# Patient Record
Sex: Female | Born: 1935
Health system: Southern US, Community
[De-identification: ages and names within clinical notes are randomized; demographics above are authoritative.]

## PROBLEM LIST (undated history)

## (undated) DIAGNOSIS — N189 Chronic kidney disease, unspecified: Secondary | ICD-10-CM

## (undated) DIAGNOSIS — K219 Gastro-esophageal reflux disease without esophagitis: Secondary | ICD-10-CM

## (undated) DIAGNOSIS — E785 Hyperlipidemia, unspecified: Secondary | ICD-10-CM

## (undated) DIAGNOSIS — I10 Essential (primary) hypertension: Secondary | ICD-10-CM

## (undated) DIAGNOSIS — M810 Age-related osteoporosis without current pathological fracture: Secondary | ICD-10-CM

## (undated) DIAGNOSIS — S7292XA Unspecified fracture of left femur, initial encounter for closed fracture: Secondary | ICD-10-CM

## (undated) DIAGNOSIS — R609 Edema, unspecified: Secondary | ICD-10-CM

## (undated) DIAGNOSIS — R4701 Aphasia: Secondary | ICD-10-CM

## (undated) DIAGNOSIS — J45909 Unspecified asthma, uncomplicated: Secondary | ICD-10-CM

## (undated) HISTORY — PX: KYPHOPLASTY: SHX5884

## (undated) HISTORY — DX: Aphasia: R47.01

---

## 2005-01-18 ENCOUNTER — Ambulatory Visit: Payer: Self-pay | Admitting: Internal Medicine

## 2006-01-20 ENCOUNTER — Ambulatory Visit: Payer: Self-pay | Admitting: Internal Medicine

## 2007-01-25 ENCOUNTER — Ambulatory Visit: Payer: Self-pay | Admitting: Internal Medicine

## 2007-08-31 ENCOUNTER — Other Ambulatory Visit: Payer: Self-pay

## 2007-08-31 ENCOUNTER — Emergency Department: Payer: Self-pay | Admitting: Emergency Medicine

## 2008-08-15 ENCOUNTER — Emergency Department: Payer: Self-pay | Admitting: Emergency Medicine

## 2011-04-08 ENCOUNTER — Ambulatory Visit: Payer: Self-pay

## 2011-05-30 ENCOUNTER — Ambulatory Visit: Payer: Self-pay | Admitting: Unknown Physician Specialty

## 2011-05-30 LAB — URINALYSIS, COMPLETE
Glucose,UR: NEGATIVE mg/dL (ref 0–75)
Nitrite: NEGATIVE
Ph: 5 (ref 4.5–8.0)
Protein: NEGATIVE
RBC,UR: 5 /HPF (ref 0–5)
WBC UR: 38 /HPF (ref 0–5)

## 2011-05-30 LAB — CBC
HCT: 37.8 % (ref 35.0–47.0)
HGB: 12.7 g/dL (ref 12.0–16.0)
MCH: 33.7 pg (ref 26.0–34.0)
Platelet: 237 10*3/uL (ref 150–440)
RBC: 3.75 10*6/uL — ABNORMAL LOW (ref 3.80–5.20)
WBC: 8.7 10*3/uL (ref 3.6–11.0)

## 2011-05-30 LAB — BASIC METABOLIC PANEL
Anion Gap: 11 (ref 7–16)
BUN: 20 mg/dL — ABNORMAL HIGH (ref 7–18)
Chloride: 99 mmol/L (ref 98–107)
Co2: 30 mmol/L (ref 21–32)
Creatinine: 1.53 mg/dL — ABNORMAL HIGH (ref 0.60–1.30)
EGFR (Non-African Amer.): 34 — ABNORMAL LOW
Glucose: 101 mg/dL — ABNORMAL HIGH (ref 65–99)
Osmolality: 282 (ref 275–301)
Potassium: 4.3 mmol/L (ref 3.5–5.1)
Sodium: 140 mmol/L (ref 136–145)

## 2011-05-30 LAB — MRSA PCR SCREENING

## 2011-06-02 ENCOUNTER — Ambulatory Visit: Payer: Self-pay | Admitting: Unknown Physician Specialty

## 2011-11-18 ENCOUNTER — Ambulatory Visit: Payer: Self-pay | Admitting: Orthopedic Surgery

## 2011-11-18 LAB — CREATININE, SERUM
Creatinine: 1.69 mg/dL — ABNORMAL HIGH (ref 0.60–1.30)
EGFR (Non-African Amer.): 26 — ABNORMAL LOW

## 2012-10-26 ENCOUNTER — Ambulatory Visit: Payer: Self-pay | Admitting: Orthopedic Surgery

## 2012-11-27 DIAGNOSIS — Z049 Encounter for examination and observation for unspecified reason: Secondary | ICD-10-CM

## 2012-11-27 DIAGNOSIS — E785 Hyperlipidemia, unspecified: Secondary | ICD-10-CM

## 2012-11-27 DIAGNOSIS — I10 Essential (primary) hypertension: Secondary | ICD-10-CM

## 2012-11-27 DIAGNOSIS — M25559 Pain in unspecified hip: Secondary | ICD-10-CM

## 2012-12-02 ENCOUNTER — Emergency Department: Payer: Self-pay | Admitting: Internal Medicine

## 2012-12-12 DIAGNOSIS — M25559 Pain in unspecified hip: Secondary | ICD-10-CM

## 2012-12-21 ENCOUNTER — Ambulatory Visit: Payer: Self-pay | Admitting: Orthopedic Surgery

## 2012-12-24 ENCOUNTER — Ambulatory Visit: Payer: Self-pay | Admitting: Orthopedic Surgery

## 2013-01-02 ENCOUNTER — Ambulatory Visit: Payer: Self-pay | Admitting: Orthopedic Surgery

## 2013-01-06 LAB — WOUND CULTURE

## 2013-01-11 DIAGNOSIS — M25559 Pain in unspecified hip: Secondary | ICD-10-CM

## 2013-01-11 DIAGNOSIS — I10 Essential (primary) hypertension: Secondary | ICD-10-CM

## 2013-01-11 DIAGNOSIS — E785 Hyperlipidemia, unspecified: Secondary | ICD-10-CM

## 2013-01-30 DIAGNOSIS — K5900 Constipation, unspecified: Secondary | ICD-10-CM

## 2013-01-30 DIAGNOSIS — G8929 Other chronic pain: Secondary | ICD-10-CM

## 2013-01-30 DIAGNOSIS — I10 Essential (primary) hypertension: Secondary | ICD-10-CM

## 2013-02-20 ENCOUNTER — Ambulatory Visit: Payer: Self-pay | Admitting: Family Medicine

## 2014-09-12 NOTE — Op Note (Signed)
PATIENT NAME:  Jacqueline Sanchez, Jacqueline Sanchez MR#:  [id number] DATE OF BIRTH:  01-15-1920  DATE OF PROCEDURE:  01/02/2013  PREOPERATIVE DIAGNOSIS:  L5 compression fracture.  POSTOPERATIVE DIAGNOSIS:  L5 compression fracture.   PROCEDURE:  L5 biopsy and kyphoplasty.   ANESTHESIA:  MAC.   SURGEON:  Laurene Footman, M.D.   DESCRIPTION OF PROCEDURE:  The patient was brought to the Operating Room and after adequate anesthesia was obtained the patient was placed prone.  C-arm was brought in and good visualization of the L5 vertebral body was made.  Appropriate timeout procedure was carried out and patient identification.  The skin was infiltrated with Xylocaine 1%, 5 mL on each side and then the back was prepped and draped in the usual sterile fashion.  A repeat timeout procedure carried out and a spinal needle used to infiltrate a mixture of 0.5% Sensorcaine with epinephrine and 1% Xylocaine along the tract down to the pedicle from the skin.  Stab incisions were made on each side and trocars entered through a perpendicular approach into the vertebral body.  On the right side a biopsy was obtained along with a culture.  Drilling was carried out on both sides and then balloons inflated, approximately 4 mL on each side.  The cement was then infiltrated into these areas with a total of 9 mL infiltrated.  There appeared to be very good fill up to the anterior wall and interdigitation into the vertebral body.  There was no extravasation.  After this good fill had been completed, the trocars were removed and permanent C-arm views obtained.  Gentle pressure applied and then Dermabond was used for the skin, followed by Band-Aids.  The patient was sent to the recovery room in stable condition.   ESTIMATED BLOOD LOSS:  Minimal.   COMPLICATIONS:  None.   SPECIMEN:  L5 vertebral body biopsy.     ____________________________ Laurene Footman, MD mjm:ea D: 01/02/2013 19:01:10 ET T: 01/03/2013 03:28:00  ET JOB#: [id number]  cc: Laurene Footman, MD, <Dictator> Laurene Footman MD ELECTRONICALLY SIGNED 01/03/2013 7:19

## 2014-09-14 NOTE — Discharge Summary (Signed)
PATIENT NAME:  BANNER, IRELAN MR#:  [id number] DATE OF BIRTH:  10-06-1919  DATE OF ADMISSION:  06/02/2011 DATE OF DISCHARGE:  06/03/2011  ADMITTING DIAGNOSIS: Status post left L5-S1 microdiskectomy for disk herniation with persistent radiculopathy and myelopathy.   DISCHARGE DIAGNOSES:  1. Status post left L5-S1 microdiskectomy for disk herniation with persistent radiculopathy and myelopathy.  2. Leg sciatica, improved.   ATTENDING: Alysia Penna. Mauri Pole, MD  with [place]: On 06/02/2011, the patient underwent left L5-S1 microdiskectomy for herniated nucleus pulposus by Dr. Albesa Seen.  Assistant was Dorthula Matas, PA-C. Anesthesia was general. Estimated blood loss was 75 mL. Fluids replaced were Crystalloid 700 mL. Operative findings included a large herniated disk. No specimens were sent. No drains were placed. No complications occurred.   PATIENT HISTORY: Ms. Laumann is a pleasant 81 year old widow with persistent radiating left leg pain. She has not responded to any epidurals or medications with long-term relief. She does feel some weakness. No right leg pain. No fevers, chills or constitutional symptoms.   ALLERGIES AND ADVERSE REACTIONS: Morphine.   PAST MEDICAL HISTORY:  1. Hypertension.  2. Cataracts.  3. Chickenpox.  4. Allergies and hay fever.  5. Hyperlipidemia.   PHYSICAL EXAMINATION: CARDIAC: Normal. CHEST: Atraumatic, clear.  LUMBOSACRAL SPINE: Tender at the lumbosacral junction. Straight leg raise is positive on the left-side at 55 degrees, negative on the right. Decreased sensation in the S1 distribution on the left side. Mild weakness of the gastrocnemius. Heel and toe walk are difficult. Her balance is poor. There is no muscular or skin atrophy.   RADIOLOGICAL DATA: A previous lumbar MRI is reviewed showing a large herniated disk at L5-S1.   HOSPITAL COURSE: The patient underwent the aforementioned procedure by Dr. Albesa Seen on January  [id number] without complication and was transferred to the PACU, then the Orthopedic floor in stable condition. On the first day postoperative, she was tolerating her diet. Her low back dressing was clean and intact. Distally, her neurologic exam was intact. Her calves were nontender. She was treated for deep vein thrombosis prophylaxis with aspirin and TED hose. She did have some hip pain, she stated, but her left leg sciatica had resolved, fortunately. She had been able to get up and go to the bathroom. Physical Therapy did see her and did establish some goals for her.   CONDITION AT DISCHARGE: Stable.   DISPOSITION: [facility].   DISCHARGE MEDICATIONS:  Percocet 5/325 mg, take 1 to 2 every 4 hours as needed for pain.  She will resume home medicines, which include the following:  1. Aspirin 81 mg once a day.  2. Multivitamin once a day in the morning.  3. Atenolol/HCTZ take 1/2 tablet of 50/25 mg daily in the morning.  4. Losartan 50 mg oral, 1 daily in the morning.  5. Gabapentin 300 mg oral b.i.d.  She may be able to go off of her gabapentin as her pain improves. Atorvastatin 80 mg oral tablet once a day at bedtime.  6. Trazodone one at bedtime as needed.  7. Vitamin D3 400 international units daily in the morning. 8. Os-Cal 500 mg oral t.i.d. with meals. 9. Tramadol 50 mg, 1 to 2 every 6 hours as needed for pain.   DISCHARGE INSTRUCTIONS AND FOLLOWUP:  1. Regular diet.  2. She is to wear knee-high TED hose bilaterally every day.  3. She is weight-bearing as tolerated. She is not to lift more than 5 pounds. She will try to avoid  stooping or squatting if at all possible.  4. She is to call [facility] to confirm a follow-up appointment.  5. If any new prescriptions for pain medicines are needed, please call us at [facility].  6. The patient may shower, but she should leave her low back dressing on.  7. She can call our office for any disturbing or troubling  symptoms.  ____________________________ Jerrel Ivory [place], [place] jrp:cbb D: 06/03/2011 13:34:02 ET T: 06/03/2011 13:50:51 ET JOB#: [id number]  cc: Jerrel Ivory. Charlett Nose, [place], <Dictator> [place] PA ELECTRONICALLY SIGNED 06/06/2011 7:45

## 2014-09-14 NOTE — Op Note (Signed)
PATIENT NAME:  Jacqueline Sanchez, Jacqueline Sanchez MR#:  [id number] DATE OF BIRTH:  26-Mar-1920  DATE OF PROCEDURE:  06/02/2011  PREOPERATIVE DIAGNOSIS: Herniated disk L5-S1, left side, with persistent radiculopathy and myelopathy.   POSTOPERATIVE DIAGNOSIS: Herniated disk L5-S1, left side, with persistent radiculopathy and myelopathy.   PROCEDURE: Left L5-S1 microdiskectomy.   SURGEON: Alysia Penna. Mauri Pole, MD    ASSISTANT: Dorthula Matas PA-C    ANESTHESIA: General.   ESTIMATED BLOOD LOSS: 75 mL.  REPLACEMENT: 700 mL Crystalloid.   DRAINS: None.   COMPLICATIONS: None.   BRIEF CLINICAL NOTE AND PATHOLOGY: The patient had persistent radicular pain in her left leg with weakness. Work-up showed evidence of a large extruded disk at L5-S1. Options, risks, and benefits were discussed after she failed conservative treatment. She elected to proceed with surgery. At the time of the procedure, there was marked compression of the S1 nerve root with partial compression of the L5 nerve root and a very large extruded fragment. There was also moderate facet hypertrophy.   DESCRIPTION OF PROCEDURE: Preop antibiotics, adequate general anesthesia, prone position on Wilson frame, all prominences well padded. Routine prepping and draping. Appropriate time-out was called.   Using fluoroscopy and a spinal needle, the appropriate level was marked. Routine posterior incision was made. Subperiosteal dissection was performed and a self-retaining retractor was placed. The microscope was then brought onto the field, position again confirmed, and a small amount of bone was removed with the bur. The ligamentum flavum was then elevated. Herniated disk material was identified. Nerve root was carefully mobilized and disk material was removed. The floor of the canal was thoroughly examined. Hemostasis was obtained with the bipolar cautery. The disk was entered through the posterior defect and several large fragments were removed. The area again  was thoroughly explored. Hemostasis was good. The exposed dura was covered with a thin layer of Surgiflo. The fascia was closed with #2 quill. Sub-Q was closed with 0 quill. Skin was closed with staples. Soft sterile dressing was applied. Sponge and needle counts were reported as correct prior to and after wound closure. The patient was awakened and taken to the PAC-U having tolerated the procedure well.   ____________________________ Alysia Penna. Mauri Pole, MD jcc:drc D: 06/03/2011 06:42:13 ET T: 06/03/2011 10:11:20 ET JOB#: [id number]  cc: Alysia Penna. Mauri Pole, MD, <Dictator> Alysia Penna Vonne Mcdanel MD ELECTRONICALLY SIGNED 06/09/2011 16:06

## 2015-07-10 DIAGNOSIS — E7800 Pure hypercholesterolemia, unspecified: Secondary | ICD-10-CM | POA: Diagnosis not present

## 2015-07-17 DIAGNOSIS — J4520 Mild intermittent asthma, uncomplicated: Secondary | ICD-10-CM | POA: Diagnosis not present

## 2015-07-17 DIAGNOSIS — N183 Chronic kidney disease, stage 3 (moderate): Secondary | ICD-10-CM | POA: Diagnosis not present

## 2015-07-17 DIAGNOSIS — E7800 Pure hypercholesterolemia, unspecified: Secondary | ICD-10-CM | POA: Diagnosis not present

## 2015-07-17 DIAGNOSIS — E538 Deficiency of other specified B group vitamins: Secondary | ICD-10-CM | POA: Diagnosis not present

## 2015-07-17 DIAGNOSIS — I10 Essential (primary) hypertension: Secondary | ICD-10-CM | POA: Diagnosis not present

## 2015-07-24 DIAGNOSIS — M9943 Connective tissue stenosis of neural canal of lumbar region: Secondary | ICD-10-CM | POA: Diagnosis not present

## 2015-10-28 DIAGNOSIS — M9943 Connective tissue stenosis of neural canal of lumbar region: Secondary | ICD-10-CM | POA: Diagnosis not present

## 2016-01-07 DIAGNOSIS — E538 Deficiency of other specified B group vitamins: Secondary | ICD-10-CM | POA: Diagnosis not present

## 2016-01-07 DIAGNOSIS — E7800 Pure hypercholesterolemia, unspecified: Secondary | ICD-10-CM | POA: Diagnosis not present

## 2016-01-14 DIAGNOSIS — J4520 Mild intermittent asthma, uncomplicated: Secondary | ICD-10-CM | POA: Diagnosis not present

## 2016-01-14 DIAGNOSIS — Z0000 Encounter for general adult medical examination without abnormal findings: Secondary | ICD-10-CM | POA: Diagnosis not present

## 2016-01-14 DIAGNOSIS — E7800 Pure hypercholesterolemia, unspecified: Secondary | ICD-10-CM | POA: Diagnosis not present

## 2016-01-14 DIAGNOSIS — N183 Chronic kidney disease, stage 3 (moderate): Secondary | ICD-10-CM | POA: Diagnosis not present

## 2016-01-14 DIAGNOSIS — I10 Essential (primary) hypertension: Secondary | ICD-10-CM | POA: Diagnosis not present

## 2016-01-27 DIAGNOSIS — M9943 Connective tissue stenosis of neural canal of lumbar region: Secondary | ICD-10-CM | POA: Diagnosis not present

## 2016-03-17 DIAGNOSIS — Z23 Encounter for immunization: Secondary | ICD-10-CM | POA: Diagnosis not present

## 2016-03-31 DIAGNOSIS — H35373 Puckering of macula, bilateral: Secondary | ICD-10-CM | POA: Diagnosis not present

## 2016-04-18 DIAGNOSIS — L853 Xerosis cutis: Secondary | ICD-10-CM | POA: Diagnosis not present

## 2016-04-18 DIAGNOSIS — L720 Epidermal cyst: Secondary | ICD-10-CM | POA: Diagnosis not present

## 2016-04-18 DIAGNOSIS — B351 Tinea unguium: Secondary | ICD-10-CM | POA: Diagnosis not present

## 2016-04-18 DIAGNOSIS — L570 Actinic keratosis: Secondary | ICD-10-CM | POA: Diagnosis not present

## 2016-04-18 DIAGNOSIS — L820 Inflamed seborrheic keratosis: Secondary | ICD-10-CM | POA: Diagnosis not present

## 2016-04-18 DIAGNOSIS — B353 Tinea pedis: Secondary | ICD-10-CM | POA: Diagnosis not present

## 2016-04-18 DIAGNOSIS — Z1283 Encounter for screening for malignant neoplasm of skin: Secondary | ICD-10-CM | POA: Diagnosis not present

## 2016-04-18 DIAGNOSIS — L821 Other seborrheic keratosis: Secondary | ICD-10-CM | POA: Diagnosis not present

## 2016-04-20 ENCOUNTER — Emergency Department: Payer: Medicare Other

## 2016-04-20 ENCOUNTER — Inpatient Hospital Stay
Admission: EM | Admit: 2016-04-20 | Discharge: 2016-04-23 | DRG: 305 | Disposition: A | Discharge status: Home / Self Care | Payer: Medicare Other | Attending: Internal Medicine | Admitting: Internal Medicine

## 2016-04-20 DIAGNOSIS — I248 Other forms of acute ischemic heart disease: Secondary | ICD-10-CM | POA: Diagnosis not present

## 2016-04-20 DIAGNOSIS — Z8249 Family history of ischemic heart disease and other diseases of the circulatory system: Secondary | ICD-10-CM

## 2016-04-20 DIAGNOSIS — I161 Hypertensive emergency: Secondary | ICD-10-CM | POA: Diagnosis not present

## 2016-04-20 DIAGNOSIS — R011 Cardiac murmur, unspecified: Secondary | ICD-10-CM | POA: Diagnosis present

## 2016-04-20 DIAGNOSIS — M6281 Muscle weakness (generalized): Secondary | ICD-10-CM

## 2016-04-20 DIAGNOSIS — M810 Age-related osteoporosis without current pathological fracture: Secondary | ICD-10-CM | POA: Diagnosis present

## 2016-04-20 DIAGNOSIS — R748 Abnormal levels of other serum enzymes: Secondary | ICD-10-CM | POA: Diagnosis not present

## 2016-04-20 DIAGNOSIS — R4701 Aphasia: Secondary | ICD-10-CM | POA: Diagnosis present

## 2016-04-20 DIAGNOSIS — N183 Chronic kidney disease, stage 3 unspecified: Secondary | ICD-10-CM | POA: Diagnosis present

## 2016-04-20 DIAGNOSIS — I129 Hypertensive chronic kidney disease with stage 1 through stage 4 chronic kidney disease, or unspecified chronic kidney disease: Secondary | ICD-10-CM | POA: Diagnosis not present

## 2016-04-20 DIAGNOSIS — N179 Acute kidney failure, unspecified: Secondary | ICD-10-CM | POA: Diagnosis present

## 2016-04-20 DIAGNOSIS — I160 Hypertensive urgency: Principal | ICD-10-CM | POA: Diagnosis present

## 2016-04-20 DIAGNOSIS — Z885 Allergy status to narcotic agent status: Secondary | ICD-10-CM

## 2016-04-20 DIAGNOSIS — I10 Essential (primary) hypertension: Secondary | ICD-10-CM | POA: Diagnosis present

## 2016-04-20 DIAGNOSIS — E785 Hyperlipidemia, unspecified: Secondary | ICD-10-CM | POA: Diagnosis not present

## 2016-04-20 DIAGNOSIS — R2990 Unspecified symptoms and signs involving the nervous system: Secondary | ICD-10-CM | POA: Diagnosis not present

## 2016-04-20 DIAGNOSIS — I2489 Other forms of acute ischemic heart disease: Secondary | ICD-10-CM | POA: Diagnosis present

## 2016-04-20 HISTORY — DX: Chronic kidney disease, unspecified: N18.9

## 2016-04-20 HISTORY — DX: Essential (primary) hypertension: I10

## 2016-04-20 HISTORY — DX: Age-related osteoporosis without current pathological fracture: M81.0

## 2016-04-20 HISTORY — DX: Unspecified asthma, uncomplicated: J45.909

## 2016-04-20 HISTORY — DX: Hyperlipidemia, unspecified: E78.5

## 2016-04-20 HISTORY — DX: Edema, unspecified: R60.9

## 2016-04-20 HISTORY — DX: Gastro-esophageal reflux disease without esophagitis: K21.9

## 2016-04-20 LAB — DIFFERENTIAL
BASOS PCT: 1 %
Basophils Absolute: 0.1 10*3/uL (ref 0–0.1)
EOS ABS: 0 10*3/uL (ref 0–0.7)
EOS PCT: 0 %
LYMPHS PCT: 16 %
Lymphs Abs: 1.4 10*3/uL (ref 1.0–3.6)
MONO ABS: 0.7 10*3/uL (ref 0.2–0.9)
Monocytes Relative: 8 %
Neutro Abs: 6.8 10*3/uL — ABNORMAL HIGH (ref 1.4–6.5)
Neutrophils Relative %: 75 %

## 2016-04-20 LAB — PROTIME-INR
INR: 0.85
PROTHROMBIN TIME: 11.6 s (ref 11.4–15.2)

## 2016-04-20 LAB — COMPREHENSIVE METABOLIC PANEL
ALK PHOS: 68 U/L (ref 38–126)
ALT: 9 U/L — ABNORMAL LOW (ref 14–54)
ANION GAP: 9 (ref 5–15)
AST: 18 U/L (ref 15–41)
Albumin: 4.1 g/dL (ref 3.5–5.0)
BUN: 14 mg/dL (ref 6–20)
CALCIUM: 10.2 mg/dL (ref 8.9–10.3)
CO2: 27 mmol/L (ref 22–32)
Chloride: 105 mmol/L (ref 101–111)
Creatinine, Ser: 1.41 mg/dL — ABNORMAL HIGH (ref 0.44–1.00)
GFR calc non Af Amer: 31 mL/min — ABNORMAL LOW (ref >60)
GFR, EST AFRICAN AMERICAN: 36 mL/min — AB (ref >60)
Glucose, Bld: 107 mg/dL — ABNORMAL HIGH (ref 65–99)
Potassium: 3.5 mmol/L (ref 3.5–5.1)
SODIUM: 141 mmol/L (ref 135–145)
Total Bilirubin: 0.6 mg/dL (ref 0.3–1.2)
Total Protein: 7.1 g/dL (ref 6.5–8.1)

## 2016-04-20 LAB — CBC
HCT: 40.1 % (ref 35.0–47.0)
Hemoglobin: 13.4 g/dL (ref 12.0–16.0)
MCH: 33.9 pg (ref 26.0–34.0)
MCHC: 33.3 g/dL (ref 32.0–36.0)
MCV: 101.7 fL — AB (ref 80.0–100.0)
PLATELETS: 267 10*3/uL (ref 150–440)
RBC: 3.95 MIL/uL (ref 3.80–5.20)
RDW: 13.9 % (ref 11.5–14.5)
WBC: 9 10*3/uL (ref 3.6–11.0)

## 2016-04-20 LAB — TROPONIN I
TROPONIN I: 0.07 ng/mL — AB (ref <0.03)
Troponin I: 0.07 ng/mL (ref <0.03)

## 2016-04-20 LAB — GLUCOSE, CAPILLARY: GLUCOSE-CAPILLARY: 110 mg/dL — AB (ref 65–99)

## 2016-04-20 LAB — APTT: aPTT: 27 seconds (ref 24–36)

## 2016-04-20 MED ORDER — HYDRALAZINE HCL 20 MG/ML IJ SOLN
10.0000 mg | INTRAMUSCULAR | Status: DC | PRN
  Administered 2016-04-20 – 2016-04-21 (×5): 10 mg via INTRAVENOUS
  Filled 2016-04-20 (×4): qty 1

## 2016-04-20 MED ORDER — METOPROLOL TARTRATE 5 MG/5ML IV SOLN
10.0000 mg | Freq: Once | INTRAVENOUS | Status: AC
  Administered 2016-04-20: 10 mg via INTRAVENOUS
  Filled 2016-04-20: qty 10

## 2016-04-20 MED ORDER — ADULT MULTIVITAMIN W/MINERALS CH
1.0000 | ORAL_TABLET | Freq: Every day | ORAL | Status: DC
  Administered 2016-04-21 – 2016-04-23 (×3): 1 via ORAL
  Filled 2016-04-20 (×3): qty 1

## 2016-04-20 MED ORDER — VITAMIN B-12 1000 MCG PO TABS
1000.0000 ug | ORAL_TABLET | Freq: Every day | ORAL | Status: DC
  Administered 2016-04-21 – 2016-04-23 (×3): 1000 ug via ORAL
  Filled 2016-04-20 (×3): qty 1

## 2016-04-20 MED ORDER — ACETAMINOPHEN 650 MG RE SUPP: 650.0000 mg | Freq: Four times a day (QID) | RECTAL | Status: DC | PRN

## 2016-04-20 MED ORDER — OXYCODONE-ACETAMINOPHEN 5-325 MG PO TABS
1.0000 | ORAL_TABLET | Freq: Four times a day (QID) | ORAL | Status: DC | PRN
Start: 2016-04-20 — End: 2016-04-21
  Administered 2016-04-21: 1 via ORAL
  Filled 2016-04-20: qty 1

## 2016-04-20 MED ORDER — ACETAMINOPHEN 325 MG PO TABS
650.0000 mg | ORAL_TABLET | Freq: Four times a day (QID) | ORAL | Status: DC | PRN
  Administered 2016-04-22: 650 mg via ORAL
  Filled 2016-04-20: qty 2

## 2016-04-20 MED ORDER — VITAMIN D 1000 UNITS PO TABS
1000.0000 [IU] | ORAL_TABLET | Freq: Every day | ORAL | Status: DC
  Administered 2016-04-21 – 2016-04-23 (×4): 1000 [IU] via ORAL
  Filled 2016-04-20 (×4): qty 1

## 2016-04-20 MED ORDER — ENOXAPARIN SODIUM 40 MG/0.4ML ~~LOC~~ SOLN
30.0000 mg | SUBCUTANEOUS | Status: DC
  Administered 2016-04-21 – 2016-04-22 (×2): 30 mg via SUBCUTANEOUS
  Filled 2016-04-20 (×2): qty 0.4

## 2016-04-20 MED ORDER — LOSARTAN POTASSIUM 50 MG PO TABS
50.0000 mg | ORAL_TABLET | Freq: Every day | ORAL | Status: DC
  Administered 2016-04-21: 50 mg via ORAL
  Filled 2016-04-20: qty 1

## 2016-04-20 MED ORDER — SODIUM CHLORIDE 0.9% FLUSH
3.0000 mL | Freq: Two times a day (BID) | INTRAVENOUS | Status: DC
  Administered 2016-04-21 – 2016-04-22 (×5): 3 mL via INTRAVENOUS

## 2016-04-20 MED ORDER — ONDANSETRON HCL 4 MG/2ML IJ SOLN
4.0000 mg | Freq: Four times a day (QID) | INTRAMUSCULAR | Status: DC | PRN
  Administered 2016-04-21: 4 mg via INTRAVENOUS
  Filled 2016-04-20 (×2): qty 2

## 2016-04-20 MED ORDER — SENNA 8.6 MG PO TABS
1.0000 | ORAL_TABLET | Freq: Every day | ORAL | Status: DC
  Administered 2016-04-21 – 2016-04-23 (×3): 8.6 mg via ORAL
  Filled 2016-04-20 (×3): qty 1

## 2016-04-20 MED ORDER — TORSEMIDE 20 MG PO TABS
10.0000 mg | ORAL_TABLET | Freq: Every day | ORAL | Status: DC
  Administered 2016-04-21 – 2016-04-22 (×2): 10 mg via ORAL
  Filled 2016-04-20 (×2): qty 1

## 2016-04-20 MED ORDER — HYDRALAZINE HCL 20 MG/ML IJ SOLN: 10.0000 mg | INTRAMUSCULAR | Status: DC | PRN

## 2016-04-20 MED ORDER — OXYCODONE HCL 5 MG PO TABS: 5.0000 mg | ORAL_TABLET | Freq: Four times a day (QID) | ORAL | Status: DC | PRN

## 2016-04-20 MED ORDER — HYDRALAZINE HCL 20 MG/ML IJ SOLN
INTRAMUSCULAR | Status: AC
  Administered 2016-04-20: 10 mg via INTRAVENOUS
  Filled 2016-04-20: qty 1

## 2016-04-20 MED ORDER — OXYCODONE-ACETAMINOPHEN 10-325 MG PO TABS: 1.0000 | ORAL_TABLET | Freq: Four times a day (QID) | ORAL | Status: DC | PRN

## 2016-04-20 MED ORDER — ONDANSETRON HCL 4 MG PO TABS
4.0000 mg | ORAL_TABLET | Freq: Four times a day (QID) | ORAL | Status: DC | PRN
Start: 2016-04-20 — End: 2016-04-23

## 2016-04-20 MED ORDER — ATORVASTATIN CALCIUM 20 MG PO TABS
80.0000 mg | ORAL_TABLET | Freq: Every day | ORAL | Status: DC
  Administered 2016-04-21 – 2016-04-23 (×3): 80 mg via ORAL
  Filled 2016-04-20 (×3): qty 4

## 2016-04-20 NOTE — ED Notes (Signed)
Patient transported to MRI with medic

## 2016-04-20 NOTE — ED Notes (Signed)
Float nurse attempting to get blood work at this time

## 2016-04-20 NOTE — ED Provider Notes (Signed)
[facility] Provider Note   CSN: [id number] Arrival date & time: 04/20/16  1511     History   Chief Complaint Chief Complaint  Patient presents with  . Aphasia  . Hypertension    HPI Jacqueline Sanchez is a 82 y.o. female hx of CKD, GERD, HTN, HL, Who presented with trouble speaking. Patient states that she has been having trouble speaking for couple days. Patient unable to tell me how many days she has trouble speaking. Patient actually had lunch with her neighbor yesterday and had some word finding difficulties. They call the primary care office and a visiting nurse came and saw her and noticed that she was hypertensive but she was not sent to the ER at that time. Today, her neighbor visited her again and noticed that her slurred speech has improved. However she was still hypertensive. Patient denies any weakness or numbness. No previous history of stroke and not on blood thinners or aspirin.   The history is provided by the patient.    Past Medical History:  Diagnosis Date  . Asthma   . CKD (chronic kidney disease)   . Edema   . GERD (gastroesophageal reflux disease)   . Hyperlipidemia   . Hypertension   . Osteoporosis     Patient Active Problem List   Diagnosis Date Noted  . Hypertensive urgency 04/20/2016    No past surgical history on file.  OB History    No data available       Home Medications    Prior to Admission medications   Medication Sig Start Date End Date Taking? Authorizing Provider  atorvastatin (LIPITOR) 80 MG tablet Take 1 tablet by mouth daily.   Yes Historical Provider, MD  diphenhydramine-acetaminophen (TYLENOL PM) 25-500 MG TABS tablet Take 1 tablet by mouth at bedtime as needed.   Yes Historical Provider, MD  losartan (COZAAR) 50 MG tablet Take 1 tablet by mouth daily. 03/08/16  Yes Historical Provider, MD  Multiple Vitamin (MULTI-VITAMINS) TABS Take 1 tablet by mouth daily.   Yes Historical Provider, MD    oxyCODONE-acetaminophen (PERCOCET) 10-325 MG tablet Take 1 tablet by mouth every 6 (six) hours as needed. 02/02/16  Yes Historical Provider, MD  senna (SENOKOT) 8.6 MG tablet Take 1 tablet by mouth daily.   Yes Historical Provider, MD  torsemide (DEMADEX) 10 MG tablet Take 1 tablet by mouth daily. 12/11/14  Yes Historical Provider, MD  vitamin B-12 (CYANOCOBALAMIN) 1000 MCG tablet Take 1,000 mcg by mouth daily.   Yes Historical Provider, MD  Vitamin D, Cholecalciferol, 1000 units CAPS Take 1 capsule by mouth daily.   Yes Historical Provider, MD    Family History No family history on file.  Social History Social History  Substance Use Topics  . Smoking status: Not on file  . Smokeless tobacco: Never Used  . Alcohol use No     Allergies   Morphine and related   Review of Systems Review of Systems  Neurological: Positive for speech difficulty.  All other systems reviewed and are negative.    Physical Exam Updated Vital Signs BP (!) 182/122 (BP Location: Left Arm)   Pulse (!) 106   Temp 98.6 F (37 C) (Oral)   Resp 14   Wt 137 lb (62.1 kg)   SpO2 97%   Physical Exam  Constitutional: She is oriented to person, place, and time. She appears well-developed and well-nourished.  Well appearing for age  HENT:  Head: Normocephalic.  Mouth/Throat: Oropharynx is clear and moist.  Eyes: EOM are normal. Pupils are equal, round, and reactive to light.  Neck: Normal range of motion. Neck supple.  Cardiovascular: Normal rate, regular rhythm and normal heart sounds.   Pulmonary/Chest: Effort normal and breath sounds normal. No respiratory distress. She has no wheezes. She has no rales.  Abdominal: Soft. Bowel sounds are normal. She exhibits no distension. There is no tenderness. There is no guarding.  Musculoskeletal: Normal range of motion.  Neurological: She is alert and oriented to person, place, and time.  No obvious facial droop. Occasional word finding difficulty. Nl strength  throughout. Nl finger to nose (has R rotator cuff problem so unable to lift R shoulder up to do finger to nose).   Skin: Skin is warm.  Psychiatric: She has a normal mood and affect.  Nursing note and vitals reviewed.    ED Treatments / Results  Labs (all labs ordered are listed, but only abnormal results are displayed) Labs Reviewed  CBC - Abnormal; Notable for the following:       Result Value   MCV 101.7 (*)    All other components within normal limits  DIFFERENTIAL - Abnormal; Notable for the following:    Neutro Abs 6.8 (*)    All other components within normal limits  COMPREHENSIVE METABOLIC PANEL - Abnormal; Notable for the following:    Glucose, Bld 107 (*)    Creatinine, Ser 1.41 (*)    ALT 9 (*)    GFR calc non Af Amer 31 (*)    GFR calc Af Amer 36 (*)    All other components within normal limits  TROPONIN I - Abnormal; Notable for the following:    Troponin I 0.07 (*)    All other components within normal limits  GLUCOSE, CAPILLARY - Abnormal; Notable for the following:    Glucose-Capillary 110 (*)    All other components within normal limits  PROTIME-INR  APTT  CBC  CREATININE, SERUM  CBG MONITORING, ED    EKG  EKG Interpretation None      ED ECG REPORT I, Wandra Arthurs, the attending physician, personally viewed and interpreted this ECG.   Date: 04/20/2016  EKG Time: 15:36 pm  Rate: 109  Rhythm: sinus tachycardia  Axis: normal  Intervals:none  ST&T Change: LVH     Radiology Ct Head Wo Contrast  Result Date: 04/20/2016 CLINICAL DATA:  Forgetting words, hard time speaking for while, noticed by a friend yesterday, none non last time was normal, history hypertension, asthma, hyperlipidemia EXAM: CT HEAD WITHOUT CONTRAST TECHNIQUE: Contiguous axial images were obtained from the base of the skull through the vertex without intravenous contrast. COMPARISON:  None. FINDINGS: Brain: Generalized atrophy. CSF attenuation structure in the RIGHT frontal lobe  3.4 x 4.1 x 3.4 cm with a few marginal calcifications, with a thin membrane separating it from the adjacent frontal horn of the RIGHT lateral ventricle. Tiny low-attenuation focus in the RIGHT basal ganglia could represent a tiny remote lacunar infarct or a prominent perivascular space. No definite intracranial hemorrhage, additional mass lesion, evidence acute infarction, or extra-axial fluid collection. Vascular: Atherosclerotic calcifications at the internal carotid and vertebral arteries at skullbase. Skull: Intact Sinuses/Orbits: Clear Other: N/A IMPRESSION: No definite evidence of acute hemorrhage or infarct. CSF attenuation cystic appearing mass in the RIGHT frontal lobe, 3.4 x 4.1 x 3.4 cm in size, containing a thin wall with a few calcifications. This has a generally benign appearance and does not appear to communicate with the ventricular system. Differential  diagnosis could include neuroglial cyst, sequela of remote infection or hemorrhage, less likely epidermoid tumor, unlikely arachnoid cyst or porencephalic cyst. If patient's clinical condition warrants further assessment, this could be evaluated by MR. Electronically Signed   By: Lavonia Dana M.D.   On: 04/20/2016 16:11   Mr Brain Wo Contrast  Result Date: 04/20/2016 CLINICAL DATA:  Difficulty speaking.  Possible stroke. EXAM: MRI HEAD WITHOUT CONTRAST TECHNIQUE: Multiplanar, multiecho pulse sequences of the brain and surrounding structures were obtained without intravenous contrast. COMPARISON:  04/20/2016 FINDINGS: Brain: There is no evidence of acute infarct, intracranial hemorrhage, midline shift, or extra-axial fluid collection. Mild generalized cerebral atrophy is within normal limits for age. Subcortical and periventricular white matter T2 hyperintensities are nonspecific but compatible with mild chronic small vessel ischemic disease. A chronic lacunar infarct is noted in the right lentiform nucleus. There are 2 adjacent tiny chronic  infarcts in the left cerebellum. As seen on the earlier CT, the there is a cystic lesion in the anterior right frontal lobe which measures 4.9 x 3.5 x 3.4 cm. This follows CSF signal intensity on all pulse sequences. There is a thin rim of intact overlying cortex. There is mild mass effect on the frontal horn of the right lateral ventricle, however the cyst appears to be separated from the ventricle by a thin membrane. A few thin internal septations are noted in the cyst with susceptibility artifact relating to calcification seen on CT. There is at most minimal gliosis along the posterior margin of this cyst. Vascular: Major intracranial vascular flow voids are preserved. Skull and upper cervical spine: Unremarkable bone marrow signal. Sinuses/Orbits: Prior bilateral cataract extraction. Paranasal sinuses and mastoid air cells are clear. Other: None. IMPRESSION: 1. No acute intracranial abnormality. 2. Mild chronic small vessel ischemic disease. 3. 4.9 cm cystic lesion in the right frontal lobe, benign in appearance. This may reflect the sequelae of prior infection, hemorrhage, or other remote insult. Electronically Signed   By: Logan Bores M.D.   On: 04/20/2016 17:55    Procedures Procedures (including critical care time)  CRITICAL CARE Performed by: Wandra Arthurs   Total critical care time: 30 minutes  Critical care time was exclusive of separately billable procedures and treating other patients.  Critical care was necessary to treat or prevent imminent or life-threatening deterioration.  Critical care was time spent personally by me on the following activities: development of treatment plan with patient and/or surrogate as well as nursing, discussions with consultants, evaluation of patient's response to treatment, examination of patient, obtaining history from patient or surrogate, ordering and performing treatments and interventions, ordering and review of laboratory studies, ordering and review  of radiographic studies, pulse oximetry and re-evaluation of patient's condition.   Medications Ordered in ED Medications  metoprolol (LOPRESSOR) injection 10 mg (not administered)  enoxaparin (LOVENOX) injection 30 mg (not administered)  sodium chloride flush (NS) 0.9 % injection 3 mL (not administered)  acetaminophen (TYLENOL) tablet 650 mg (not administered)    Or  acetaminophen (TYLENOL) suppository 650 mg (not administered)  ondansetron (ZOFRAN) tablet 4 mg (not administered)    Or  ondansetron (ZOFRAN) injection 4 mg (not administered)  hydrALAZINE (APRESOLINE) injection 10 mg (not administered)     Initial Impression / Assessment and Plan / ED Course  I have reviewed the triage vital signs and the nursing notes.  Pertinent labs & imaging results that were available during my care of the patient were reviewed by me and considered in my medical  decision making (see chart for details).  Clinical Course    Angelina Venard is a 82 y.o. female here with occasional trouble speaking. Hypertensive 180/122 in the ED. No obvious facial droop and only has occasional word finding difficulty. Unclear time of onset. Will get CT head and likely MRI brain, labs to see if she had TIA vs stroke.   7:06 PM CT head showed possible cyst vs mass. MRi brain showed uncomplicated cyst with no mass effect or acute stroke. Trop 0.07. No chest pain. Concerned for possible hypertensive urgency. Given lopressor to lower BP. Will admit for hypertensive urgency.   Final Clinical Impressions(s) / ED Diagnoses   Final diagnoses:  None    New Prescriptions New Prescriptions   No medications on file     Drenda Freeze, MD 04/20/16 1906

## 2016-04-20 NOTE — ED Notes (Signed)
Sandwich tray given at this time 

## 2016-04-20 NOTE — H&P (Addendum)
[facility] NAME: Jacqueline Sanchez    MR#:  [id number]  DATE OF BIRTH:  [birth year]   DATE OF ADMISSION:  04/20/2016  PRIMARY CARE PHYSICIAN: Kirk Ruths., MD   REQUESTING/REFERRING PHYSICIAN: Darl Householder  CHIEF COMPLAINT:   Chief Complaint  Patient presents with  . Aphasia  . Hypertension    HISTORY OF PRESENT ILLNESS:  Jacqueline Sanchez  is a 82 y.o. female with a known history of Essential hypertension who is presenting with difficulty speaking. One day prior to admission to the hospital she had difficulty with word finding spoke to her regular doctor who suggested she be evaluated. She saw the doctor earlier today to admission and noted to have markedly elevated blood pressure thus sent to Hospital further workup and evaluation. She is otherwise asymptomatic at this time speech back to normal  Emergency department course MRI brain findings consistent with cystic disease, benign  PAST MEDICAL HISTORY:   Past Medical History:  Diagnosis Date  . Asthma   . CKD (chronic kidney disease)   . Edema   . GERD (gastroesophageal reflux disease)   . Hyperlipidemia   . Hypertension   . Osteoporosis     PAST SURGICAL HISTORY:  History reviewed. No pertinent surgical history.  SOCIAL HISTORY:   Social History  Substance Use Topics  . Smoking status: Never Smoker  . Smokeless tobacco: Never Used  . Alcohol use No    FAMILY HISTORY:   Family History  Problem Relation Age of Onset  . Hypertension Other     DRUG ALLERGIES:   Allergies  Allergen Reactions  . Morphine And Related     REVIEW OF SYSTEMS:  REVIEW OF SYSTEMS:  CONSTITUTIONAL: Denies fevers, chills, fatigue, weakness.  EYES: Denies blurred vision, double vision, or eye pain.  EARS, NOSE, THROAT: Denies tinnitus, ear pain, hearing loss.  RESPIRATORY: denies cough, shortness of breath, wheezing  CARDIOVASCULAR: Denies chest pain, palpitations, edema.    GASTROINTESTINAL: Denies nausea, vomiting, diarrhea, abdominal pain.  GENITOURINARY: Denies dysuria, hematuria.  ENDOCRINE: Denies nocturia or thyroid problems. HEMATOLOGIC AND LYMPHATIC: Denies easy bruising or bleeding.  SKIN: Denies rash or lesions.  MUSCULOSKELETAL: Denies pain in neck, back, shoulder, knees, hips, or further arthritic symptoms.  NEUROLOGIC: Denies paralysis, paresthesias.  PSYCHIATRIC: Denies anxiety or depressive symptoms. Otherwise full review of systems performed by me is negative.   MEDICATIONS AT HOME:   Prior to Admission medications   Medication Sig Start Date End Date Taking? Authorizing Provider  atorvastatin (LIPITOR) 80 MG tablet Take 1 tablet by mouth daily.   Yes Historical Provider, MD  diphenhydramine-acetaminophen (TYLENOL PM) 25-500 MG TABS tablet Take 1 tablet by mouth at bedtime as needed.   Yes Historical Provider, MD  losartan (COZAAR) 50 MG tablet Take 1 tablet by mouth daily. 03/08/16  Yes Historical Provider, MD  Multiple Vitamin (MULTI-VITAMINS) TABS Take 1 tablet by mouth daily.   Yes Historical Provider, MD  oxyCODONE-acetaminophen (PERCOCET) 10-325 MG tablet Take 1 tablet by mouth every 6 (six) hours as needed. 02/02/16  Yes Historical Provider, MD  senna (SENOKOT) 8.6 MG tablet Take 1 tablet by mouth daily.   Yes Historical Provider, MD  torsemide (DEMADEX) 10 MG tablet Take 1 tablet by mouth daily. 12/11/14  Yes Historical Provider, MD  vitamin B-12 (CYANOCOBALAMIN) 1000 MCG tablet Take 1,000 mcg by mouth daily.   Yes Historical Provider, MD  Vitamin D, Cholecalciferol, 1000 units CAPS Take 1 capsule by mouth daily.  Yes Historical Provider, MD      VITAL SIGNS:  Blood pressure (!) 182/122, pulse (!) 106, temperature 98.6 F (37 C), temperature source Oral, resp. rate 14, weight 62.1 kg (137 lb), SpO2 97 %.  PHYSICAL EXAMINATION:  VITAL SIGNS: Vitals:   04/20/16 1517 04/20/16 1615  BP: (!) 182/122   Pulse: (!) 106   Resp: 18 14   Temp: 98.6 F (37 C)    GENERAL:82 y.o.female currently in no acute distress.  HEAD: Normocephalic, atraumatic.  EYES: Pupils equal, round, reactive to light. Extraocular muscles intact. No scleral icterus.  MOUTH: Moist mucosal membrane. Dentition intact. No abscess noted.  EAR, NOSE, THROAT: Clear without exudates. No external lesions.  NECK: Supple. No thyromegaly. No nodules. No JVD.  PULMONARY: Clear to ascultation, without wheeze rails or rhonci. No use of accessory muscles, Good respiratory effort. good air entry bilaterally CHEST: Nontender to palpation.  CARDIOVASCULAR: S1 and S2. Regular rate and rhythm. No murmurs, rubs, or gallops. No edema. Pedal pulses 2+ bilaterally.  GASTROINTESTINAL: Soft, nontender, nondistended. No masses. Positive bowel sounds. No hepatosplenomegaly.  MUSCULOSKELETAL: No swelling, clubbing, or edema. Range of motion full in all extremities.  NEUROLOGIC: Cranial nerves II through XII are intact. No gross focal neurological deficits. Sensation intact. Reflexes intact.  SKIN: No ulceration, lesions, rashes, or cyanosis. Skin warm and dry. Turgor intact.  PSYCHIATRIC: Mood, affect within normal limits. The patient is awake, alert and oriented x 3. Insight, judgment intact.    LABORATORY PANEL:   CBC  Recent Labs Lab 04/20/16 1619  WBC 9.0  HGB 13.4  HCT 40.1  PLT 267   ------------------------------------------------------------------------------------------------------------------  Chemistries   Recent Labs Lab 04/20/16 1619  NA 141  K 3.5  CL 105  CO2 27  GLUCOSE 107*  BUN 14  CREATININE 1.41*  CALCIUM 10.2  AST 18  ALT 9*  ALKPHOS 68  BILITOT 0.6   ------------------------------------------------------------------------------------------------------------------  Cardiac Enzymes  Recent Labs Lab 04/20/16 1619  TROPONINI 0.07*    ------------------------------------------------------------------------------------------------------------------  RADIOLOGY:  Ct Head Wo Contrast  Result Date: 04/20/2016 CLINICAL DATA:  Forgetting words, hard time speaking for while, noticed by a friend yesterday, none non last time was normal, history hypertension, asthma, hyperlipidemia EXAM: CT HEAD WITHOUT CONTRAST TECHNIQUE: Contiguous axial images were obtained from the base of the skull through the vertex without intravenous contrast. COMPARISON:  None. FINDINGS: Brain: Generalized atrophy. CSF attenuation structure in the RIGHT frontal lobe 3.4 x 4.1 x 3.4 cm with a few marginal calcifications, with a thin membrane separating it from the adjacent frontal horn of the RIGHT lateral ventricle. Tiny low-attenuation focus in the RIGHT basal ganglia could represent a tiny remote lacunar infarct or a prominent perivascular space. No definite intracranial hemorrhage, additional mass lesion, evidence acute infarction, or extra-axial fluid collection. Vascular: Atherosclerotic calcifications at the internal carotid and vertebral arteries at skullbase. Skull: Intact Sinuses/Orbits: Clear Other: N/A IMPRESSION: No definite evidence of acute hemorrhage or infarct. CSF attenuation cystic appearing mass in the RIGHT frontal lobe, 3.4 x 4.1 x 3.4 cm in size, containing a thin wall with a few calcifications. This has a generally benign appearance and does not appear to communicate with the ventricular system. Differential diagnosis could include neuroglial cyst, sequela of remote infection or hemorrhage, less likely epidermoid tumor, unlikely arachnoid cyst or porencephalic cyst. If patient's clinical condition warrants further assessment, this could be evaluated by MR. Electronically Signed   By: Lavonia Dana M.D.   On: 04/20/2016 16:11  Mr Brain Wo Contrast  Result Date: 04/20/2016 CLINICAL DATA:  Difficulty speaking.  Possible stroke. EXAM: MRI HEAD  WITHOUT CONTRAST TECHNIQUE: Multiplanar, multiecho pulse sequences of the brain and surrounding structures were obtained without intravenous contrast. COMPARISON:  04/20/2016 FINDINGS: Brain: There is no evidence of acute infarct, intracranial hemorrhage, midline shift, or extra-axial fluid collection. Mild generalized cerebral atrophy is within normal limits for age. Subcortical and periventricular white matter T2 hyperintensities are nonspecific but compatible with mild chronic small vessel ischemic disease. A chronic lacunar infarct is noted in the right lentiform nucleus. There are 2 adjacent tiny chronic infarcts in the left cerebellum. As seen on the earlier CT, the there is a cystic lesion in the anterior right frontal lobe which measures 4.9 x 3.5 x 3.4 cm. This follows CSF signal intensity on all pulse sequences. There is a thin rim of intact overlying cortex. There is mild mass effect on the frontal horn of the right lateral ventricle, however the cyst appears to be separated from the ventricle by a thin membrane. A few thin internal septations are noted in the cyst with susceptibility artifact relating to calcification seen on CT. There is at most minimal gliosis along the posterior margin of this cyst. Vascular: Major intracranial vascular flow voids are preserved. Skull and upper cervical spine: Unremarkable bone marrow signal. Sinuses/Orbits: Prior bilateral cataract extraction. Paranasal sinuses and mastoid air cells are clear. Other: None. IMPRESSION: 1. No acute intracranial abnormality. 2. Mild chronic small vessel ischemic disease. 3. 4.9 cm cystic lesion in the right frontal lobe, benign in appearance. This may reflect the sequelae of prior infection, hemorrhage, or other remote insult. Electronically Signed   By: Logan Bores M.D.   On: 04/20/2016 17:55    EKG:   Orders placed or performed during the hospital encounter of 04/20/16  . ED EKG  . ED EKG    IMPRESSION AND PLAN:    82 year old Caucasian female who is remarkably pleasant with a history of hypertension presenting with high blood pressure  1. Hypertensive urgency: Continue with home medications add as needed hydralazine goal blood pressure will be about 180/100 to be further adjusted after 24 hours ago of 160/80  2. Aphasia,: Completely resolved no evidence of stroke likely all related to blood pressure 3. Hyperlipidemia unspecified Lipitor  4. Elevated troponin: tele, trend cardiac enzyme  All the records are reviewed and case discussed with ED provider. Management plans discussed with the patient, family and they are in agreement.  CODE STATUS: Full  TOTAL TIME TAKING CARE OF THIS PATIENT: 33 minutes.    Sakia Schrimpf,  Karenann Cai.D on 04/20/2016 at 7:08 PM  Between 7am to 6pm - Pager - [phone]  After 6pm: House Pager: - [facility]  Office  [phone]  CC: Primary care physician; Kirk Ruths., MD

## 2016-04-20 NOTE — ED Triage Notes (Addendum)
Pt to ER via POV c/o hypertension and "hard time speaking for a while". When asking patient to elaborate, she cannot give time. Friend states that she noticed it yesterday. Denies slurred speech but patient having a difficult recalling words when trying to hold conversation. Pt speech clear at this time. Pt alert and oriented X4, active, cooperative, pt in NAD. RR even and unlabored, color WNL.

## 2016-04-21 DIAGNOSIS — I248 Other forms of acute ischemic heart disease: Secondary | ICD-10-CM | POA: Diagnosis not present

## 2016-04-21 DIAGNOSIS — E785 Hyperlipidemia, unspecified: Secondary | ICD-10-CM | POA: Diagnosis not present

## 2016-04-21 DIAGNOSIS — N183 Chronic kidney disease, stage 3 unspecified: Secondary | ICD-10-CM | POA: Diagnosis present

## 2016-04-21 DIAGNOSIS — R4701 Aphasia: Secondary | ICD-10-CM

## 2016-04-21 DIAGNOSIS — R748 Abnormal levels of other serum enzymes: Secondary | ICD-10-CM | POA: Diagnosis not present

## 2016-04-21 DIAGNOSIS — I160 Hypertensive urgency: Secondary | ICD-10-CM | POA: Diagnosis not present

## 2016-04-21 DIAGNOSIS — I10 Essential (primary) hypertension: Secondary | ICD-10-CM | POA: Diagnosis not present

## 2016-04-21 LAB — TROPONIN I
TROPONIN I: 0.1 ng/mL — AB (ref <0.03)
Troponin I: 0.08 ng/mL (ref <0.03)

## 2016-04-21 MED ORDER — OXYCODONE-ACETAMINOPHEN 5-325 MG PO TABS: 1.0000 | ORAL_TABLET | Freq: Four times a day (QID) | ORAL | Status: DC | PRN

## 2016-04-21 MED ORDER — LOSARTAN POTASSIUM 50 MG PO TABS
100.0000 mg | ORAL_TABLET | Freq: Every day | ORAL | Status: DC
  Administered 2016-04-22 – 2016-04-23 (×2): 100 mg via ORAL
  Filled 2016-04-21 (×2): qty 2

## 2016-04-21 MED ORDER — AMLODIPINE BESYLATE 5 MG PO TABS
5.0000 mg | ORAL_TABLET | Freq: Every day | ORAL | Status: DC
  Administered 2016-04-21: 5 mg via ORAL
  Filled 2016-04-21: qty 1

## 2016-04-21 MED ORDER — AMLODIPINE BESYLATE 10 MG PO TABS
10.0000 mg | ORAL_TABLET | Freq: Every day | ORAL | Status: DC
  Administered 2016-04-22 – 2016-04-23 (×2): 10 mg via ORAL
  Filled 2016-04-21 (×2): qty 1

## 2016-04-21 MED ORDER — OXYCODONE HCL 5 MG PO TABS: 5.0000 mg | ORAL_TABLET | Freq: Four times a day (QID) | ORAL | Status: DC | PRN

## 2016-04-21 MED ORDER — METOPROLOL SUCCINATE ER 25 MG PO TB24
25.0000 mg | ORAL_TABLET | Freq: Every day | ORAL | Status: DC
Start: 2016-04-21 — End: 2016-04-22
  Administered 2016-04-21 – 2016-04-22 (×2): 25 mg via ORAL
  Filled 2016-04-21 (×2): qty 1

## 2016-04-21 MED ORDER — HYDRALAZINE HCL 25 MG PO TABS
25.0000 mg | ORAL_TABLET | Freq: Three times a day (TID) | ORAL | Status: DC
  Administered 2016-04-21 – 2016-04-22 (×2): 25 mg via ORAL
  Filled 2016-04-21 (×2): qty 1

## 2016-04-21 NOTE — Care Management (Signed)
Patient is a resident of [facility]- independent living level of care.  she lives alone.  Has a friend from the community visiting.  Patient is alert and oriented.  She was placed in observation due to aphasia (which cleared in the ED) and hypertension.  Patient says she is taking her blood pressure medications regularly.  She is ambulatory and and able to perform her alds.  She would rather not have home health nurse come to her home but would like her blood pressure to be checked by the independent living nurse.  CM was informed that patient can not be seen in the home daily for blood pressure checks but she can be seen daily in the clinic.  Informed patient and she is agreeable.

## 2016-04-21 NOTE — Progress Notes (Signed)
Arrival Method: via stretcher with ED NT Mental Orientation:?A&O X 4 Telemetry:?[id number], verified by Jaquita Folds, NT Skin:?Intact, Verified by Lowella Petties, RN IV: 22g left forearm Pain: Pt denies pains Tubes: No attachments Safety Measures: Safety Fall Prevention Plan has been given, discussed & signed, non skid socks in place, patient has been orientated to the room, unit & staff.  Family:?Neighbor and patient have been informed of plan of care. Orders have been reviewed &implemented. Will continue to monitor the patient. Call light has been placed within reach.

## 2016-04-21 NOTE — Care Management Obs Status (Signed)
[facility] NOTIFICATION   Patient Details  Name: Jacqueline Sanchez MRN: [id number] Date of Birth: April 08, 1920   Medicare Observation Status Notification Given:  Yes    Katrina Stack, RN 04/21/2016, 3:08 PM

## 2016-04-21 NOTE — Consult Note (Signed)
Cardiology Consultation Note  Patient ID: Jacqueline Sanchez, MRN: [id number], DOB/AGE: [birth year] 80 y.o. Admit date: 04/20/2016   Date of Consult: 04/21/2016 Primary Physician: Kirk Ruths., MD Primary Cardiologist: New to [facility] Requesting Physician: Dr. Manuella Ghazi, MD  Chief Complaint: Aphasia Reason for Consult: Elevated troponin  HPI: 82 y.o. female with h/o CKD stage III, HTN, HLD, asthma, GERD, and osteoporosis who presented to [facility] on 11/29 with difficulty speaking and accelerated hypertension. She was found to have malignant urgency with SBP of 213 mmHg. Troponin peaked at 0.10. Cardiology asked to evaluate.   No prior known cardiac history. Never with chest pain or SOB leading her to come to the hospital. No current symptoms concerning for ACS while admitted. Patient was admitted on 11/29 after noting difficulty with word finding after talking with her PCP who recommended ED evaluation. Upon her arrival to the ED she was noted to be markedly hypertensive with SBP 213. She was continued on her current home antihypertensive medications with prn hydralazine added by IM. BP currently 329 systolic. Troponin was checked and trended with a peak of 0.10 at this time. 12-lead EKG showed sinus tachycardia, 109 bpm, LVH, nonspecific st/t changes. SCr 1.41 (approximately her baseline), K+ 3.5, wbc 9.0, hgb 13.4, plt 267. CT head without definited evidence of acute hemorrhage or infarct. CSF attenuation cystic mass in teh right frontal lobe measuring 3.4 x 4.1 x 3.4 cm. MRI brain showed no acute intracranial abnormality with mild chronic small vessel diseae. 4.9 cm cystic lesion in the right frontal lobe that was benign appearing.    Past Medical History:  Diagnosis Date  . Asthma   . CKD (chronic kidney disease)   . Edema   . GERD (gastroesophageal reflux disease)   . Hyperlipidemia   . Hypertension   . Osteoporosis       Most Recent Cardiac Studies: Echo pending   Surgical History:  History reviewed. No pertinent surgical history.   Home Meds: Prior to Admission medications   Medication Sig Start Date End Date Taking? Authorizing Provider  atorvastatin (LIPITOR) 80 MG tablet Take 1 tablet by mouth daily.   Yes Historical Provider, MD  diphenhydramine-acetaminophen (TYLENOL PM) 25-500 MG TABS tablet Take 1 tablet by mouth at bedtime as needed.   Yes Historical Provider, MD  losartan (COZAAR) 50 MG tablet Take 1 tablet by mouth daily. 03/08/16  Yes Historical Provider, MD  Multiple Vitamin (MULTI-VITAMINS) TABS Take 1 tablet by mouth daily.   Yes Historical Provider, MD  oxyCODONE-acetaminophen (PERCOCET) 10-325 MG tablet Take 1 tablet by mouth every 6 (six) hours as needed. 02/02/16  Yes Historical Provider, MD  senna (SENOKOT) 8.6 MG tablet Take 1 tablet by mouth daily.   Yes Historical Provider, MD  torsemide (DEMADEX) 10 MG tablet Take 1 tablet by mouth daily. 12/11/14  Yes Historical Provider, MD  vitamin B-12 (CYANOCOBALAMIN) 1000 MCG tablet Take 1,000 mcg by mouth daily.   Yes Historical Provider, MD  Vitamin D, Cholecalciferol, 1000 units CAPS Take 1 capsule by mouth daily.   Yes Historical Provider, MD    Inpatient Medications:  . atorvastatin  80 mg Oral Daily  . cholecalciferol  1,000 Units Oral Daily  . enoxaparin (LOVENOX) injection  30 mg Subcutaneous Q24H  . losartan  50 mg Oral Daily  . multivitamin with minerals  1 tablet Oral Daily  . senna  1 tablet Oral Daily  . sodium chloride flush  3 mL Intravenous Q12H  . torsemide  10 mg Oral Daily  .  vitamin B-12  1,000 mcg Oral Daily     Allergies:  Allergies  Allergen Reactions  . Morphine And Related     Social History   Social History  . Marital status: Widowed    Spouse name: N/A  . Number of children: N/A  . Years of education: N/A   Occupational History  . Not on file.   Social History Main Topics  . Smoking status: Never Smoker  . Smokeless tobacco: Never Used  . Alcohol use No  .  Drug use: Unknown  . Sexual activity: Not on file   Other Topics Concern  . Not on file   Social History Narrative  . No narrative on file     Family History  Problem Relation Age of Onset  . Hypertension Other      Review of Systems: Review of Systems  Constitutional: Positive for malaise/fatigue. Negative for chills, diaphoresis, fever and weight loss.  HENT: Negative for congestion.   Eyes: Negative for discharge and redness.  Respiratory: Negative for cough, hemoptysis, sputum production, shortness of breath and wheezing.   Cardiovascular: Negative for chest pain, palpitations, orthopnea, claudication, leg swelling and PND.  Gastrointestinal: Negative for abdominal pain, blood in stool, heartburn, melena, nausea and vomiting.  Genitourinary: Negative for hematuria.  Musculoskeletal: Negative for falls and myalgias.  Skin: Negative for rash.  Neurological: Positive for speech change, weakness and headaches. Negative for dizziness, tingling, tremors, sensory change, focal weakness and loss of consciousness.  Endo/Heme/Allergies: Does not bruise/bleed easily.  Psychiatric/Behavioral: Negative for substance abuse. The patient is not nervous/anxious.   All other systems reviewed and are negative.   Labs:  Recent Labs  04/20/16 1619 04/20/16 2227 04/21/16 0436 04/21/16 0955  TROPONINI 0.07* 0.07* 0.08* 0.10*   Lab Results  Component Value Date   WBC 9.0 04/20/2016   HGB 13.4 04/20/2016   HCT 40.1 04/20/2016   MCV 101.7 (H) 04/20/2016   PLT 267 04/20/2016     Recent Labs Lab 04/20/16 1619  NA 141  K 3.5  CL 105  CO2 27  BUN 14  CREATININE 1.41*  CALCIUM 10.2  PROT 7.1  BILITOT 0.6  ALKPHOS 68  ALT 9*  AST 18  GLUCOSE 107*   No results found for: CHOL, HDL, LDLCALC, TRIG No results found for: DDIMER  Radiology/Studies:  Ct Head Wo Contrast  Result Date: 04/20/2016 CLINICAL DATA:  Forgetting words, hard time speaking for while, noticed by a friend  yesterday, none non last time was normal, history hypertension, asthma, hyperlipidemia EXAM: CT HEAD WITHOUT CONTRAST TECHNIQUE: Contiguous axial images were obtained from the base of the skull through the vertex without intravenous contrast. COMPARISON:  None. FINDINGS: Brain: Generalized atrophy. CSF attenuation structure in the RIGHT frontal lobe 3.4 x 4.1 x 3.4 cm with a few marginal calcifications, with a thin membrane separating it from the adjacent frontal horn of the RIGHT lateral ventricle. Tiny low-attenuation focus in the RIGHT basal ganglia could represent a tiny remote lacunar infarct or a prominent perivascular space. No definite intracranial hemorrhage, additional mass lesion, evidence acute infarction, or extra-axial fluid collection. Vascular: Atherosclerotic calcifications at the internal carotid and vertebral arteries at skullbase. Skull: Intact Sinuses/Orbits: Clear Other: N/A IMPRESSION: No definite evidence of acute hemorrhage or infarct. CSF attenuation cystic appearing mass in the RIGHT frontal lobe, 3.4 x 4.1 x 3.4 cm in size, containing a thin wall with a few calcifications. This has a generally benign appearance and does not appear to communicate with  the ventricular system. Differential diagnosis could include neuroglial cyst, sequela of remote infection or hemorrhage, less likely epidermoid tumor, unlikely arachnoid cyst or porencephalic cyst. If patient's clinical condition warrants further assessment, this could be evaluated by MR. Electronically Signed   By: Lavonia Dana M.D.   On: 04/20/2016 16:11   Mr Brain Wo Contrast  Result Date: 04/20/2016 CLINICAL DATA:  Difficulty speaking.  Possible stroke. EXAM: MRI HEAD WITHOUT CONTRAST TECHNIQUE: Multiplanar, multiecho pulse sequences of the brain and surrounding structures were obtained without intravenous contrast. COMPARISON:  04/20/2016 FINDINGS: Brain: There is no evidence of acute infarct, intracranial hemorrhage, midline shift,  or extra-axial fluid collection. Mild generalized cerebral atrophy is within normal limits for age. Subcortical and periventricular white matter T2 hyperintensities are nonspecific but compatible with mild chronic small vessel ischemic disease. A chronic lacunar infarct is noted in the right lentiform nucleus. There are 2 adjacent tiny chronic infarcts in the left cerebellum. As seen on the earlier CT, the there is a cystic lesion in the anterior right frontal lobe which measures 4.9 x 3.5 x 3.4 cm. This follows CSF signal intensity on all pulse sequences. There is a thin rim of intact overlying cortex. There is mild mass effect on the frontal horn of the right lateral ventricle, however the cyst appears to be separated from the ventricle by a thin membrane. A few thin internal septations are noted in the cyst with susceptibility artifact relating to calcification seen on CT. There is at most minimal gliosis along the posterior margin of this cyst. Vascular: Major intracranial vascular flow voids are preserved. Skull and upper cervical spine: Unremarkable bone marrow signal. Sinuses/Orbits: Prior bilateral cataract extraction. Paranasal sinuses and mastoid air cells are clear. Other: None. IMPRESSION: 1. No acute intracranial abnormality. 2. Mild chronic small vessel ischemic disease. 3. 4.9 cm cystic lesion in the right frontal lobe, benign in appearance. This may reflect the sequelae of prior infection, hemorrhage, or other remote insult. Electronically Signed   By: Logan Bores M.D.   On: 04/20/2016 17:55    EKG: Interpreted by me showed: sinus tachycardia, 109 bpm, LVH, nonspecific st/t changes Telemetry: Interpreted by me showed: NSR< low 100's to 120's bpm  Weights: Filed Weights   04/20/16 1518  Weight: 137 lb (62.1 kg)     Physical Exam: Blood pressure (!) 189/70, pulse (!) 102, temperature 98.2 F (36.8 C), temperature source Oral, resp. rate 18, height 5\' 1"  (1.549 m), weight 137 lb (62.1  kg), SpO2 95 %. Body mass index is 25.89 kg/m. General: Well developed, well nourished, in no acute distress. Head: Normocephalic, atraumatic, sclera non-icteric, no xanthomas, nares are without discharge.  Neck: Negative for carotid bruits. JVD not elevated. Lungs: Clear bilaterally to auscultation without wheezes, rales, or rhonchi. Breathing is unlabored. Heart: Tachycardic with S1 S2. No murmurs, rubs, or gallops appreciated. Abdomen: Soft, non-tender, non-distended with normoactive bowel sounds. No hepatomegaly. No rebound/guarding. No obvious abdominal masses. Msk:  Strength and tone appear normal for age. Extremities: No clubbing or cyanosis. No edema. Distal pedal pulses are 2+ and equal bilaterally. Neuro: Alert and oriented X 3. No facial asymmetry. No focal deficit. Moves all extremities spontaneously. Psych:  Responds to questions appropriately with a normal affect.    Assessment and Plan:  Principal Problem:   Essential hypertension, malignant Active Problems:   Aphasia   Demand ischemia (HCC)   CKD (chronic kidney disease), stage III    1. Elevated troponin:  -Patient never with any symptoms concerning  for ischemia  -Mildly elevated and flat trending likely in the setting of supply demand ischemia 2/2 malignant hypertension and CKD stage III -Check echo to evaluate LV systolic function and wall motion  -Given her lack of cardiac symptoms and advanced age no further inpatient ischemia work up is indicated if normal echo   2. Malignant hypertension:  -Would recommend increasing patient's losartan to 100 mg daily  -Given patient's persistent tachycardia would add Toprol XL 25 mg daily  -If needed after the above changes to her medications, would add amlodipine 5 mg this PM and titrate to 10 mg  -Will defer further management of her hypertension to IM   3. CKD stage III:  -Monitor   4. Aphasia:  -Resolved  -Per IM  -No evidence of stroke  5. Dispo: -Patient  wants to go home   Signed, Marcille Blanco [place] Pager: [phone] 04/21/2016, 12:29 PM

## 2016-04-21 NOTE — Progress Notes (Signed)
[place] [facility] [place] NAME: Jacqueline Sanchez    MR#:  [id number]  DATE OF BIRTH:  03-29-1920  SUBJECTIVE:  CHIEF COMPLAINT:   Chief Complaint  Patient presents with  . Aphasia  . Hypertension  still having some difficulty word finding per pt and friend who is at bedside, BP remains high REVIEW OF SYSTEMS:  Review of Systems  Constitutional: Negative for chills, fever and weight loss.  HENT: Negative for nosebleeds and sore throat.   Eyes: Negative for blurred vision.  Respiratory: Negative for cough, shortness of breath and wheezing.   Cardiovascular: Negative for chest pain, orthopnea, leg swelling and PND.  Gastrointestinal: Negative for abdominal pain, constipation, diarrhea, heartburn, nausea and vomiting.  Genitourinary: Negative for dysuria and urgency.  Musculoskeletal: Negative for back pain.  Skin: Negative for rash.  Neurological: Positive for speech change. Negative for dizziness, focal weakness and headaches.  Endo/Heme/Allergies: Does not bruise/bleed easily.  Psychiatric/Behavioral: Negative for depression.   DRUG ALLERGIES:   Allergies  Allergen Reactions  . Morphine And Related    VITALS:  Blood pressure (!) 207/65, pulse (!) 101, temperature 98.2 F (36.8 C), temperature source Oral, resp. rate 18, height 5\' 1"  (1.549 m), weight 62.1 kg (137 lb), SpO2 95 %. PHYSICAL EXAMINATION:  Physical Exam  Constitutional: She is oriented to person, place, and time and well-developed, well-nourished, and in no distress.  HENT:  Head: Normocephalic and atraumatic.  Eyes: Conjunctivae and EOM are normal. Pupils are equal, round, and reactive to light.  Neck: Normal range of motion. Neck supple. No tracheal deviation present. No thyromegaly present.  Cardiovascular: Normal rate, regular rhythm and normal heart sounds.   Pulmonary/Chest: Effort normal and breath sounds normal. No respiratory distress. She has no wheezes. She  exhibits no tenderness.  Abdominal: Soft. Bowel sounds are normal. She exhibits no distension. There is no tenderness.  Musculoskeletal: Normal range of motion.  Neurological: She is alert and oriented to person, place, and time. No cranial nerve deficit.  Skin: Skin is warm and dry. No rash noted.  Psychiatric: Mood and affect normal.   LABORATORY PANEL:   CBC  Recent Labs Lab 04/20/16 1619  WBC 9.0  HGB 13.4  HCT 40.1  PLT 267   ------------------------------------------------------------------------------------------------------------------ Chemistries   Recent Labs Lab 04/20/16 1619  NA 141  K 3.5  CL 105  CO2 27  GLUCOSE 107*  BUN 14  CREATININE 1.41*  CALCIUM 10.2  AST 18  ALT 9*  ALKPHOS 68  BILITOT 0.6   RADIOLOGY:  Mr Brain Wo Contrast  Result Date: 04/20/2016 CLINICAL DATA:  Difficulty speaking.  Possible stroke. EXAM: MRI HEAD WITHOUT CONTRAST TECHNIQUE: Multiplanar, multiecho pulse sequences of the brain and surrounding structures were obtained without intravenous contrast. COMPARISON:  04/20/2016 FINDINGS: Brain: There is no evidence of acute infarct, intracranial hemorrhage, midline shift, or extra-axial fluid collection. Mild generalized cerebral atrophy is within normal limits for age. Subcortical and periventricular white matter T2 hyperintensities are nonspecific but compatible with mild chronic small vessel ischemic disease. A chronic lacunar infarct is noted in the right lentiform nucleus. There are 2 adjacent tiny chronic infarcts in the left cerebellum. As seen on the earlier CT, the there is a cystic lesion in the anterior right frontal lobe which measures 4.9 x 3.5 x 3.4 cm. This follows CSF signal intensity on all pulse sequences. There is a thin rim of intact overlying cortex. There is mild mass effect on the frontal  horn of the right lateral ventricle, however the cyst appears to be separated from the ventricle by a thin membrane. A few thin  internal septations are noted in the cyst with susceptibility artifact relating to calcification seen on CT. There is at most minimal gliosis along the posterior margin of this cyst. Vascular: Major intracranial vascular flow voids are preserved. Skull and upper cervical spine: Unremarkable bone marrow signal. Sinuses/Orbits: Prior bilateral cataract extraction. Paranasal sinuses and mastoid air cells are clear. Other: None. IMPRESSION: 1. No acute intracranial abnormality. 2. Mild chronic small vessel ischemic disease. 3. 4.9 cm cystic lesion in the right frontal lobe, benign in appearance. This may reflect the sequelae of prior infection, hemorrhage, or other remote insult. Electronically Signed   By: Logan Bores M.D.   On: 04/20/2016 17:55   ASSESSMENT AND PLAN:  82 year old Caucasian female who is remarkably pleasant with a history of hypertension presenting with high blood pressure  1. Malignant HTN:  - check echo - cardio c/s - increase amlodipine to 10 mg - add hydralazine TID - continue losartan, torsemide, metoprolol and prn hydralazine  2. Aphasia,: likely all related to blood pressure, MRI neg, will c/s neuro  3. Hyperlipidemia unspecified Lipitor  4. Elevated troponin: echo, likely due to supply demand ischemia due to 1     All the records are reviewed and case discussed with Care Management/Social Worker. Management plans discussed with the patient, family and they are in agreement.  CODE STATUS: FULL CODE  TOTAL TIME TAKING CARE OF THIS PATIENT: 35 minutes.   More than 50% of the time was spent in counseling/coordination of care: YES  POSSIBLE D/C IN 1-2 DAYS, DEPENDING ON CLINICAL CONDITION. And BP control, NEURO eval   Max Sane M.D on 04/21/2016 at 5:08 PM  Between 7am to 6pm - Pager - [phone]  After 6pm go to [email or web address] - Proofreader  [facility]  Office  [phone]  CC: Primary care physician;  Kirk Ruths., MD  Note: This dictation was prepared with Dragon dictation along with smaller phrase technology. Any transcriptional errors that result from this process are unintentional.

## 2016-04-22 ENCOUNTER — Observation Stay (HOSPITAL_BASED_OUTPATIENT_CLINIC_OR_DEPARTMENT_OTHER)
Admit: 2016-04-22 | Discharge: 2016-04-22 | Disposition: A | Discharge status: Home / Self Care | Payer: Medicare Other | Attending: Internal Medicine | Admitting: Internal Medicine

## 2016-04-22 DIAGNOSIS — I129 Hypertensive chronic kidney disease with stage 1 through stage 4 chronic kidney disease, or unspecified chronic kidney disease: Secondary | ICD-10-CM | POA: Diagnosis present

## 2016-04-22 DIAGNOSIS — Z885 Allergy status to narcotic agent status: Secondary | ICD-10-CM | POA: Diagnosis not present

## 2016-04-22 DIAGNOSIS — I160 Hypertensive urgency: Secondary | ICD-10-CM | POA: Diagnosis present

## 2016-04-22 DIAGNOSIS — I248 Other forms of acute ischemic heart disease: Secondary | ICD-10-CM | POA: Diagnosis present

## 2016-04-22 DIAGNOSIS — R4701 Aphasia: Secondary | ICD-10-CM | POA: Diagnosis not present

## 2016-04-22 DIAGNOSIS — R748 Abnormal levels of other serum enzymes: Secondary | ICD-10-CM | POA: Diagnosis not present

## 2016-04-22 DIAGNOSIS — I10 Essential (primary) hypertension: Secondary | ICD-10-CM | POA: Diagnosis present

## 2016-04-22 DIAGNOSIS — R0600 Dyspnea, unspecified: Secondary | ICD-10-CM | POA: Diagnosis not present

## 2016-04-22 DIAGNOSIS — N179 Acute kidney failure, unspecified: Secondary | ICD-10-CM | POA: Diagnosis present

## 2016-04-22 DIAGNOSIS — Z8249 Family history of ischemic heart disease and other diseases of the circulatory system: Secondary | ICD-10-CM | POA: Diagnosis not present

## 2016-04-22 DIAGNOSIS — N183 Chronic kidney disease, stage 3 (moderate): Secondary | ICD-10-CM | POA: Diagnosis not present

## 2016-04-22 DIAGNOSIS — R011 Cardiac murmur, unspecified: Secondary | ICD-10-CM | POA: Diagnosis present

## 2016-04-22 DIAGNOSIS — M810 Age-related osteoporosis without current pathological fracture: Secondary | ICD-10-CM | POA: Diagnosis present

## 2016-04-22 DIAGNOSIS — E785 Hyperlipidemia, unspecified: Secondary | ICD-10-CM | POA: Diagnosis not present

## 2016-04-22 HISTORY — DX: Aphasia: R47.01

## 2016-04-22 LAB — BASIC METABOLIC PANEL
Anion gap: 6 (ref 5–15)
BUN: 22 mg/dL — AB (ref 6–20)
CHLORIDE: 105 mmol/L (ref 101–111)
CO2: 30 mmol/L (ref 22–32)
Calcium: 9.8 mg/dL (ref 8.9–10.3)
Creatinine, Ser: 1.61 mg/dL — ABNORMAL HIGH (ref 0.44–1.00)
GFR calc Af Amer: 31 mL/min — ABNORMAL LOW (ref >60)
GFR calc non Af Amer: 26 mL/min — ABNORMAL LOW (ref >60)
GLUCOSE: 112 mg/dL — AB (ref 65–99)
POTASSIUM: 3.6 mmol/L (ref 3.5–5.1)
Sodium: 141 mmol/L (ref 135–145)

## 2016-04-22 LAB — ECHOCARDIOGRAM COMPLETE
Height: 61 in
WEIGHTICAEL: 2192 [oz_av]

## 2016-04-22 LAB — CBC
HCT: 37.6 % (ref 35.0–47.0)
HEMOGLOBIN: 12.7 g/dL (ref 12.0–16.0)
MCH: 33.7 pg (ref 26.0–34.0)
MCHC: 33.7 g/dL (ref 32.0–36.0)
MCV: 100 fL (ref 80.0–100.0)
Platelets: 247 10*3/uL (ref 150–440)
RBC: 3.76 MIL/uL — AB (ref 3.80–5.20)
RDW: 13.9 % (ref 11.5–14.5)
WBC: 9.9 10*3/uL (ref 3.6–11.0)

## 2016-04-22 MED ORDER — METOPROLOL SUCCINATE ER 50 MG PO TB24
50.0000 mg | ORAL_TABLET | Freq: Every day | ORAL | Status: DC
  Administered 2016-04-23: 50 mg via ORAL
  Filled 2016-04-22: qty 1

## 2016-04-22 MED ORDER — HYDRALAZINE HCL 25 MG PO TABS
25.0000 mg | ORAL_TABLET | Freq: Four times a day (QID) | ORAL | Status: DC
  Administered 2016-04-22 – 2016-04-23 (×3): 25 mg via ORAL
  Filled 2016-04-22 (×3): qty 1

## 2016-04-22 MED ORDER — METOPROLOL SUCCINATE ER 25 MG PO TB24
25.0000 mg | ORAL_TABLET | Freq: Once | ORAL | Status: AC
  Administered 2016-04-22: 25 mg via ORAL
  Filled 2016-04-22: qty 1

## 2016-04-22 NOTE — Care Management (Signed)
Per attending and primary nurse, patient's blood pressure still unstable.  She has had low grade temp and confusion.  Confusion is not patient's baseline.  Neuro consult is pending. has required increased in dose of norvasc, hydralazine, metoprolol and cozaar. Attending says he has admitted the patient

## 2016-04-22 NOTE — Progress Notes (Signed)
[facility] [place] NAME: Jacqueline Sanchez    MR#:  [id number]  DATE OF BIRTH:  [birth year]  SUBJECTIVE:  CHIEF COMPLAINT:   Chief Complaint  Patient presents with  . Aphasia  . Hypertension  Speech much improved, but pressure still fluctuating anywhere from 944-967 systolic, friend at bedside, renal function got worse REVIEW OF SYSTEMS:  Review of Systems  Constitutional: Negative for chills, fever and weight loss.  HENT: Negative for nosebleeds and sore throat.   Eyes: Negative for blurred vision.  Respiratory: Negative for cough, shortness of breath and wheezing.   Cardiovascular: Negative for chest pain, orthopnea, leg swelling and PND.  Gastrointestinal: Negative for abdominal pain, constipation, diarrhea, heartburn, nausea and vomiting.  Genitourinary: Negative for dysuria and urgency.  Musculoskeletal: Negative for back pain.  Skin: Negative for rash.  Neurological: Negative for dizziness, focal weakness and headaches.  Endo/Heme/Allergies: Does not bruise/bleed easily.  Psychiatric/Behavioral: Negative for depression.   DRUG ALLERGIES:   Allergies  Allergen Reactions  . Morphine And Related    VITALS:  Blood pressure (!) 155/57, pulse 95, temperature 98.9 F (37.2 C), temperature source Oral, resp. rate 18, height 5\' 1"  (1.549 m), weight 62.1 kg (137 lb), SpO2 94 %. PHYSICAL EXAMINATION:  Physical Exam  Constitutional: She is oriented to person, place, and time and well-developed, well-nourished, and in no distress.  HENT:  Head: Normocephalic and atraumatic.  Eyes: Conjunctivae and EOM are normal. Pupils are equal, round, and reactive to light.  Neck: Normal range of motion. Neck supple. No tracheal deviation present. No thyromegaly present.  Cardiovascular: Normal rate, regular rhythm and normal heart sounds.   Pulmonary/Chest: Effort normal and breath sounds normal. No respiratory distress. She has no wheezes. She  exhibits no tenderness.  Abdominal: Soft. Bowel sounds are normal. She exhibits no distension. There is no tenderness.  Musculoskeletal: Normal range of motion.  Neurological: She is alert and oriented to person, place, and time. No cranial nerve deficit.  Skin: Skin is warm and dry. No rash noted.  Psychiatric: Mood and affect normal.   LABORATORY PANEL:   CBC  Recent Labs Lab 04/22/16 0407  WBC 9.9  HGB 12.7  HCT 37.6  PLT 247   ------------------------------------------------------------------------------------------------------------------ Chemistries   Recent Labs Lab 04/20/16 1619 04/22/16 0407  NA 141 141  K 3.5 3.6  CL 105 105  CO2 27 30  GLUCOSE 107* 112*  BUN 14 22*  CREATININE 1.41* 1.61*  CALCIUM 10.2 9.8  AST 18  --   ALT 9*  --   ALKPHOS 68  --   BILITOT 0.6  --    RADIOLOGY:  No results found. ASSESSMENT AND PLAN:  82 year old Caucasian female who is remarkably pleasant with a history of hypertension presenting with high blood pressure  1. Malignant HTN:  - echo Within normal limits - cardio following - increase amlodipine to 10 mg - Increased hydralazine QID - continue losartan, metoprolol and prn hydralazine - Stop torsemide  2.Acute on chronic renal failure 3 - Hold torsemide - Monitor with IV hydration  3. Hyperlipidemia unspecified Lipitor  4. Elevated troponin: echo, likely due to supply demand ischemia due to 1  5.  Aphasia,: likely all related to blood pressure, MRI neg, Appreciate Neuro Input    All the records are reviewed and case discussed with Care Management/Social Worker. Management plans discussed with the patient, family and they are in agreement.  CODE STATUS: FULL CODE  TOTAL TIME  TAKING CARE OF THIS PATIENT: 35 minutes.   More than 50% of the time was spent in counseling/coordination of care: YES  POSSIBLE D/C IN 1-2 DAYS, DEPENDING ON CLINICAL CONDITION. And BP control, renal function   Jacqueline Sanchez M.D  on 04/22/2016 at 4:15 PM  Between 7am to 6pm - Pager - [phone]  After 6pm go to [email or web address] - Proofreader  [facility]  Office  [phone]  CC: Primary care physician; Kirk Ruths., MD  Note: This dictation was prepared with Dragon dictation along with smaller phrase technology. Any transcriptional errors that result from this process are unintentional.

## 2016-04-22 NOTE — Progress Notes (Signed)
Disoriented x 4.  After each question she thinks, them pauses and says "oh I know that", but then can't answer.  Returned to the questions twice.  BP 187/60. No prn treatment for this BP.  Will discuss with MD during morning rounds.

## 2016-04-22 NOTE — Progress Notes (Signed)
Patient Name: Jacqueline Sanchez Date of Encounter: 04/22/2016  Primary Cardiologist: Johnny Bridge, MD   Hospital Problem List     Principal Problem:   Essential hypertension, malignant Active Problems:   Aphasia   Demand ischemia ([place])   CKD (chronic kidney disease), stage III     Subjective   No c/p or sob.  BP variable.  Awaiting echo.  Inpatient Medications    . amLODipine  10 mg Oral Daily  . atorvastatin  80 mg Oral Daily  . cholecalciferol  1,000 Units Oral Daily  . enoxaparin (LOVENOX) injection  30 mg Subcutaneous Q24H  . hydrALAZINE  25 mg Oral Q8H  . losartan  100 mg Oral Daily  . metoprolol succinate  25 mg Oral Daily  . multivitamin with minerals  1 tablet Oral Daily  . senna  1 tablet Oral Daily  . sodium chloride flush  3 mL Intravenous Q12H  . torsemide  10 mg Oral Daily  . vitamin B-12  1,000 mcg Oral Daily    Vital Signs    Vitals:   04/21/16 1837 04/21/16 1933 04/22/16 0427 04/22/16 0729  BP: (!) 125/96 (!) 130/46 (!) 164/57 (!) 187/60  Pulse:  97 92 98  Resp:  18 15 16   Temp:   98 F (36.7 C) 99.4 F (37.4 C)  TempSrc:    Oral  SpO2:  96% 94% 91%  Weight:      Height:        Intake/Output Summary (Last 24 hours) at 04/22/16 7591 Last data filed at 04/21/16 2019  Gross per 24 hour  Intake              240 ml  Output                0 ml  Net              240 ml   Filed Weights   04/20/16 1518  Weight: 137 lb (62.1 kg)    Physical Exam   GEN: Well nourished, well developed, in no acute distress.  HEENT: Grossly normal.  Neck: Supple, no JVD, carotid bruits, or masses. Cardiac: RRR, no murmurs, rubs, or gallops. No clubbing, cyanosis, edema.  Radials/DP/PT 2+ and equal bilaterally.  Respiratory:  Respirations regular and unlabored, clear to auscultation bilaterally. GI: Soft, nontender, nondistended, BS + x 4. MS: no deformity or atrophy. Skin: warm and dry, no rash. Neuro:  Strength and sensation are intact. Psych: AAOx3.   Normal affect.  Labs    CBC  Recent Labs  04/20/16 1619 04/22/16 0407  WBC 9.0 9.9  NEUTROABS 6.8*  --   HGB 13.4 12.7  HCT 40.1 37.6  MCV 101.7* 100.0  PLT 267 638   Basic Metabolic Panel  Recent Labs  04/20/16 1619 04/22/16 0407  NA 141 141  K 3.5 3.6  CL 105 105  CO2 27 30  GLUCOSE 107* 112*  BUN 14 22*  CREATININE 1.41* 1.61*  CALCIUM 10.2 9.8   Liver Function Tests  Recent Labs  04/20/16 1619  AST 18  ALT 9*  ALKPHOS 68  BILITOT 0.6  PROT 7.1  ALBUMIN 4.1   Cardiac Enzymes  Recent Labs  04/20/16 2227 04/21/16 0436 04/21/16 0955  TROPONINI 0.07* 0.08* 0.10*    Telemetry    RSR  Radiology    Ct Head Wo Contrast  Result Date: 04/20/2016 CLINICAL DATA:  Forgetting words, hard time speaking for while, noticed by a friend yesterday, none  non last time was normal, history hypertension, asthma, hyperlipidemia EXAM: CT HEAD WITHOUT CONTRAST TECHNIQUE: Contiguous axial images were obtained from the base of the skull through the vertex without intravenous contrast. COMPARISON:  None. FINDINGS: Brain: Generalized atrophy. CSF attenuation structure in the RIGHT frontal lobe 3.4 x 4.1 x 3.4 cm with a few marginal calcifications, with a thin membrane separating it from the adjacent frontal horn of the RIGHT lateral ventricle. Tiny low-attenuation focus in the RIGHT basal ganglia could represent a tiny remote lacunar infarct or a prominent perivascular space. No definite intracranial hemorrhage, additional mass lesion, evidence acute infarction, or extra-axial fluid collection. Vascular: Atherosclerotic calcifications at the internal carotid and vertebral arteries at skullbase. Skull: Intact Sinuses/Orbits: Clear Other: N/A IMPRESSION: No definite evidence of acute hemorrhage or infarct. CSF attenuation cystic appearing mass in the RIGHT frontal lobe, 3.4 x 4.1 x 3.4 cm in size, containing a thin wall with a few calcifications. This has a generally benign  appearance and does not appear to communicate with the ventricular system. Differential diagnosis could include neuroglial cyst, sequela of remote infection or hemorrhage, less likely epidermoid tumor, unlikely arachnoid cyst or porencephalic cyst. If patient's clinical condition warrants further assessment, this could be evaluated by MR. Electronically Signed   By: Lavonia Dana M.D.   On: 04/20/2016 16:11   Mr Brain Wo Contrast  Result Date: 04/20/2016 CLINICAL DATA:  Difficulty speaking.  Possible stroke. EXAM: MRI HEAD WITHOUT CONTRAST TECHNIQUE: Multiplanar, multiecho pulse sequences of the brain and surrounding structures were obtained without intravenous contrast. COMPARISON:  04/20/2016 FINDINGS: Brain: There is no evidence of acute infarct, intracranial hemorrhage, midline shift, or extra-axial fluid collection. Mild generalized cerebral atrophy is within normal limits for age. Subcortical and periventricular white matter T2 hyperintensities are nonspecific but compatible with mild chronic small vessel ischemic disease. A chronic lacunar infarct is noted in the right lentiform nucleus. There are 2 adjacent tiny chronic infarcts in the left cerebellum. As seen on the earlier CT, the there is a cystic lesion in the anterior right frontal lobe which measures 4.9 x 3.5 x 3.4 cm. This follows CSF signal intensity on all pulse sequences. There is a thin rim of intact overlying cortex. There is mild mass effect on the frontal horn of the right lateral ventricle, however the cyst appears to be separated from the ventricle by a thin membrane. A few thin internal septations are noted in the cyst with susceptibility artifact relating to calcification seen on CT. There is at most minimal gliosis along the posterior margin of this cyst. Vascular: Major intracranial vascular flow voids are preserved. Skull and upper cervical spine: Unremarkable bone marrow signal. Sinuses/Orbits: Prior bilateral cataract extraction.  Paranasal sinuses and mastoid air cells are clear. Other: None. IMPRESSION: 1. No acute intracranial abnormality. 2. Mild chronic small vessel ischemic disease. 3. 4.9 cm cystic lesion in the right frontal lobe, benign in appearance. This may reflect the sequelae of prior infection, hemorrhage, or other remote insult. Electronically Signed   By: Logan Bores M.D.   On: 04/20/2016 17:55    Patient Profile     82 y/o ? with a h/o HTN, dementia, and ckd III who wasa admitted 11/29 with confusion and malignant hypertension, and noted to have mild troponin elevation.  Assessment & Plan    1.  Malignant Hypertension:  BP variable since admission.  Currently elevated @ 187/60.  HR's have been in 90's.  Creat up.  Hold torsemide.  Titrate Toprol XL to  50 daily.  Cont ARB/CCB/Hydralazine.  If pressure remains elevated, would titrate hydralazine next (already on max doses of ARB/CCB).  2.  CKD III:  BUN/Creat/Bicarb up this am.  Hold torsemide. Follow.  3.  Elevated troponin:  No chest pain.  Awaiting echo.  C0nt  blocker.  ? Utility of statin given advanced age.  Signed, Murray Hodgkins NP 04/22/2016, 9:37 AM

## 2016-04-22 NOTE — Progress Notes (Signed)
*  PRELIMINARY RESULTS* Echocardiogram 2D Echocardiogram has been performed.  Jacqueline Sanchez 04/22/2016, 11:45 AM

## 2016-04-22 NOTE — Consult Note (Signed)
Referring Physician: Manuella Ghazi    Chief Complaint: Slurred speech  HPI: Jacqueline Sanchez is an 82 y.o. female with a history of HTN who reports that about 5 days ago her friend noted while they were at lunch that she was having work finding difficulties.  This continued and on 11/28 she spoke to her PCP who suggested that she be evaluated.  ON being seen in the ED her BP was elevated.  Friend reports that she feels her speech is better today.  Initial NIHSS of 1.    Date last known well: Unable to determine Time last known well: Unable to determine tPA Given: No: Unable to determine LKW  Past Medical History:  Diagnosis Date  . Asthma   . CKD (chronic kidney disease)   . Edema   . GERD (gastroesophageal reflux disease)   . Hyperlipidemia   . Hypertension   . Osteoporosis     History reviewed. No pertinent surgical history.  Family History  Problem Relation Age of Onset  . Hypertension Other    Social History:  reports that she has never smoked. She has never used smokeless tobacco. She reports that she does not drink alcohol. Her drug history is not on file.  Allergies:  Allergies  Allergen Reactions  . Morphine And Related     Medications:  I have reviewed the patient's current medications. Prior to Admission:  Prescriptions Prior to Admission  Medication Sig Dispense Refill Last Dose  . atorvastatin (LIPITOR) 80 MG tablet Take 1 tablet by mouth daily.   unknown at unknown  . diphenhydramine-acetaminophen (TYLENOL PM) 25-500 MG TABS tablet Take 1 tablet by mouth at bedtime as needed.   prn at prn  . losartan (COZAAR) 50 MG tablet Take 1 tablet by mouth daily.   unknown at unknown  . Multiple Vitamin (MULTI-VITAMINS) TABS Take 1 tablet by mouth daily.   unknown at unknown  . oxyCODONE-acetaminophen (PERCOCET) 10-325 MG tablet Take 1 tablet by mouth every 6 (six) hours as needed.   prn at prn  . senna (SENOKOT) 8.6 MG tablet Take 1 tablet by mouth daily.   prn at prn  .  torsemide (DEMADEX) 10 MG tablet Take 1 tablet by mouth daily.   unknown at unknown  . vitamin B-12 (CYANOCOBALAMIN) 1000 MCG tablet Take 1,000 mcg by mouth daily.   unknown at unknown  . Vitamin D, Cholecalciferol, 1000 units CAPS Take 1 capsule by mouth daily.   unknown at unknown   Scheduled: . amLODipine  10 mg Oral Daily  . atorvastatin  80 mg Oral Daily  . cholecalciferol  1,000 Units Oral Daily  . enoxaparin (LOVENOX) injection  30 mg Subcutaneous Q24H  . hydrALAZINE  25 mg Oral Q6H  . losartan  100 mg Oral Daily  . [START ON 04/23/2016] metoprolol succinate  50 mg Oral Daily  . multivitamin with minerals  1 tablet Oral Daily  . senna  1 tablet Oral Daily  . sodium chloride flush  3 mL Intravenous Q12H  . vitamin B-12  1,000 mcg Oral Daily    ROS: History obtained from the patient  General ROS: negative for - chills, fatigue, fever, night sweats, weight gain or weight loss Psychological ROS: negative for - behavioral disorder, hallucinations, memory difficulties, mood swings or suicidal ideation Ophthalmic ROS: negative for - blurry vision, double vision, eye pain or loss of vision ENT ROS: negative for - epistaxis, nasal discharge, oral lesions, sore throat, tinnitus or vertigo Allergy and Immunology ROS: negative  for - hives or itchy/watery eyes Hematological and Lymphatic ROS: negative for - bleeding problems, bruising or swollen lymph nodes Endocrine ROS: negative for - galactorrhea, hair pattern changes, polydipsia/polyuria or temperature intolerance Respiratory ROS: negative for - cough, hemoptysis, shortness of breath or wheezing Cardiovascular ROS: negative for - chest pain, dyspnea on exertion, edema or irregular heartbeat Gastrointestinal ROS: negative for - abdominal pain, diarrhea, hematemesis, nausea/vomiting or stool incontinence Genito-Urinary ROS: negative for - dysuria, hematuria, incontinence or urinary frequency/urgency Musculoskeletal ROS: right shoulder  pain Neurological ROS: as noted in HPI Dermatological ROS: negative for rash and skin lesion changes  Physical Examination: Blood pressure (!) 155/57, pulse 95, temperature 98.9 F (37.2 C), temperature source Oral, resp. rate 18, height 5\' 1"  (1.549 m), weight 62.1 kg (137 lb), SpO2 94 %.  HEENT-  Normocephalic, no lesions, without obvious abnormality.  Normal external eye and conjunctiva.  Normal TM's bilaterally.  Normal auditory canals and external ears. Normal external nose, mucus membranes and septum.  Normal pharynx. Cardiovascular- S1, S2 normal and +murmur, pulses palpable throughout   Lungs- chest clear, no wheezing, rales, normal symmetric air entry Abdomen- soft, non-tender; bowel sounds normal; no masses,  no organomegaly Extremities- no edema Lymph-no adenopathy palpable Musculoskeletal-no joint tenderness, deformity or swelling Skin-warm and dry, no hyperpigmentation, vitiligo, or suspicious lesions  Neurological Examination Mental Status: Alert, oriented, thought content appropriate.  Speech fluent without evidence of aphasia.  Able to follow 3 step commands without difficulty. Cranial Nerves: II: Discs flat bilaterally; Visual fields grossly normal, pupils equal, round, reactive to light and accommodation III,IV, VI: ptosis not present, extra-ocular motions intact bilaterally V,VII: smile symmetric, facial light touch sensation normal bilaterally VIII: hearing normal bilaterally IX,X: gag reflex present XI: bilateral shoulder shrug XII: midline tongue extension Motor: Right : Upper extremity   5/5 with some limitation at the shoulder   Left:     Upper extremity   5/5  Lower extremity   5/5         Lower extremity   5/5 Tone and bulk:normal tone throughout; no atrophy noted Sensory: Pinprick and light touch intact throughout, bilaterally Deep Tendon Reflexes: 2+ and symmetric with absent AJ's bilaterally Plantars: Right: upgoing   Left: upgoing Cerebellar: Normal  finger-to-nose and normal heel-to-shin testing bilaterally Gait: not tested due to safety concerns    Laboratory Studies:  Basic Metabolic Panel:  Recent Labs Lab 04/20/16 1619 04/22/16 0407  NA 141 141  K 3.5 3.6  CL 105 105  CO2 27 30  GLUCOSE 107* 112*  BUN 14 22*  CREATININE 1.41* 1.61*  CALCIUM 10.2 9.8    Liver Function Tests:  Recent Labs Lab 04/20/16 1619  AST 18  ALT 9*  ALKPHOS 68  BILITOT 0.6  PROT 7.1  ALBUMIN 4.1   No results for input(s): LIPASE, AMYLASE in the last 168 hours. No results for input(s): AMMONIA in the last 168 hours.  CBC:  Recent Labs Lab 04/20/16 1619 04/22/16 0407  WBC 9.0 9.9  NEUTROABS 6.8*  --   HGB 13.4 12.7  HCT 40.1 37.6  MCV 101.7* 100.0  PLT 267 247    Cardiac Enzymes:  Recent Labs Lab 04/20/16 1619 04/20/16 2227 04/21/16 0436 04/21/16 0955  TROPONINI 0.07* 0.07* 0.08* 0.10*    BNP: Invalid input(s): POCBNP  CBG:  Recent Labs Lab 04/20/16 [place]    Microbiology: No results found for this or any previous visit.  Coagulation Studies:  Recent Labs  04/20/16 1619  LABPROT 11.6  INR 0.85    Urinalysis: No results for input(s): COLORURINE, LABSPEC, PHURINE, GLUCOSEU, HGBUR, BILIRUBINUR, KETONESUR, PROTEINUR, UROBILINOGEN, NITRITE, LEUKOCYTESUR in the last 168 hours.  Invalid input(s): APPERANCEUR  Lipid Panel: No results found for: CHOL, TRIG, HDL, CHOLHDL, VLDL, LDLCALC  HgbA1C: No results found for: HGBA1C  Urine Drug Screen:  No results found for: LABOPIA, COCAINSCRNUR, LABBENZ, AMPHETMU, THCU, LABBARB  Alcohol Level: No results for input(s): ETH in the last 168 hours.  Other results: EKG: sinus tachycardia at 109 bpm.  Imaging: Ct Head Wo Contrast  Result Date: 04/20/2016 CLINICAL DATA:  Forgetting words, hard time speaking for while, noticed by a friend yesterday, none non last time was normal, history hypertension, asthma, hyperlipidemia EXAM: CT HEAD WITHOUT  CONTRAST TECHNIQUE: Contiguous axial images were obtained from the base of the skull through the vertex without intravenous contrast. COMPARISON:  None. FINDINGS: Brain: Generalized atrophy. CSF attenuation structure in the RIGHT frontal lobe 3.4 x 4.1 x 3.4 cm with a few marginal calcifications, with a thin membrane separating it from the adjacent frontal horn of the RIGHT lateral ventricle. Tiny low-attenuation focus in the RIGHT basal ganglia could represent a tiny remote lacunar infarct or a prominent perivascular space. No definite intracranial hemorrhage, additional mass lesion, evidence acute infarction, or extra-axial fluid collection. Vascular: Atherosclerotic calcifications at the internal carotid and vertebral arteries at skullbase. Skull: Intact Sinuses/Orbits: Clear Other: N/A IMPRESSION: No definite evidence of acute hemorrhage or infarct. CSF attenuation cystic appearing mass in the RIGHT frontal lobe, 3.4 x 4.1 x 3.4 cm in size, containing a thin wall with a few calcifications. This has a generally benign appearance and does not appear to communicate with the ventricular system. Differential diagnosis could include neuroglial cyst, sequela of remote infection or hemorrhage, less likely epidermoid tumor, unlikely arachnoid cyst or porencephalic cyst. If patient's clinical condition warrants further assessment, this could be evaluated by MR. Electronically Signed   By: Lavonia Dana M.D.   On: 04/20/2016 16:11   Mr Brain Wo Contrast  Result Date: 04/20/2016 CLINICAL DATA:  Difficulty speaking.  Possible stroke. EXAM: MRI HEAD WITHOUT CONTRAST TECHNIQUE: Multiplanar, multiecho pulse sequences of the brain and surrounding structures were obtained without intravenous contrast. COMPARISON:  04/20/2016 FINDINGS: Brain: There is no evidence of acute infarct, intracranial hemorrhage, midline shift, or extra-axial fluid collection. Mild generalized cerebral atrophy is within normal limits for age.  Subcortical and periventricular white matter T2 hyperintensities are nonspecific but compatible with mild chronic small vessel ischemic disease. A chronic lacunar infarct is noted in the right lentiform nucleus. There are 2 adjacent tiny chronic infarcts in the left cerebellum. As seen on the earlier CT, the there is a cystic lesion in the anterior right frontal lobe which measures 4.9 x 3.5 x 3.4 cm. This follows CSF signal intensity on all pulse sequences. There is a thin rim of intact overlying cortex. There is mild mass effect on the frontal horn of the right lateral ventricle, however the cyst appears to be separated from the ventricle by a thin membrane. A few thin internal septations are noted in the cyst with susceptibility artifact relating to calcification seen on CT. There is at most minimal gliosis along the posterior margin of this cyst. Vascular: Major intracranial vascular flow voids are preserved. Skull and upper cervical spine: Unremarkable bone marrow signal. Sinuses/Orbits: Prior bilateral cataract extraction. Paranasal sinuses and mastoid air cells are clear. Other: None. IMPRESSION: 1. No acute intracranial abnormality. 2. Mild chronic small vessel  ischemic disease. 3. 4.9 cm cystic lesion in the right frontal lobe, benign in appearance. This may reflect the sequelae of prior infection, hemorrhage, or other remote insult. Electronically Signed   By: Logan Bores M.D.   On: 04/20/2016 17:55    Assessment: 82 y.o. female presenting with difficulty with speech.  No significant deficits noted on neurological examination at this time.  MRI of the brain reviewed and shows no acute changes.  BP elevated on presentation and complaints may very well have been related to elevated BP.  Echocardiogram shows no cardiac source of emboli with an EF of 65-70%.  Patient on no antiplatelet therapy at home.    Stroke Risk Factors - hyperlipidemia and hypertension  Plan: 1. Agree with BP mamangement 2. ASA  81mg  daily 3. Telemetry monitoring 4. Frequent neuro checks 5. No further neurologic intervention is recommended at this time.  If further questions arise, please call or page at that time.  Thank you for allowing neurology to participate in the care of this patient.   Alexis Goodell, MD Neurology [phone] 04/22/2016, 1:59 PM

## 2016-04-22 NOTE — Evaluation (Signed)
Physical Therapy Evaluation Patient Details Name: Jacqueline Sanchez MRN: [id number] DOB: 09-06-1919 Today's Date: 04/22/2016   History of Present Illness  82 y/o female who had episode of work finding difficulty while out with a friend.  Symptoms seem to have resolved, MRI reveal no acute infarct.    Clinical Impression  Pt did very well with PT exam and was nearly at her baseline.  She did have some mild fatigue with ~200 ft of ambulation using her cane, but ultimately was safe and confident with mobility and ambulation.  Neighbor/friend was present and reports she will be able to help her out initially at home if needed, but both agree with PT that she is doing well and can safely go home. Will keep her on caseload for fine-tuning mobility/balance/activity tolerance but pt should not need further PT intervention on d/c.     Follow Up Recommendations No PT follow up (will keep on caseload for balance and activity tolerance )    Equipment Recommendations       Recommendations for Other Services       Precautions / Restrictions Restrictions Weight Bearing Restrictions: No      Mobility  Bed Mobility Overal bed mobility: Independent             General bed mobility comments: Pt with very light use of rail, able to rise to sitting EOB with good confidence  Transfers Overall transfer level: Independent Equipment used: None             General transfer comment: Pt able to rise to standing and maintain balance w/o AD, used cane once up  Ambulation/Gait Ambulation/Gait assistance: Modified independent (Device/Increase time) Ambulation Distance (Feet): 200 Feet Assistive device: Straight cane       General Gait Details: Pt shows consistent and safe ambulation and is not overly reliant on the cane.  She had no LOBs, only minimal fatigue and per pt (and friend) was essentially at her baseline.   Stairs            Wheelchair Mobility    Modified Rankin (Stroke  Patients Only)       Balance Overall balance assessment: Independent                                           Pertinent Vitals/Pain Pain Assessment: No/denies pain    Home Living Family/patient expects to be discharged to::  (independent living facility)                      Prior Function Level of Independence: Independent with assistive device(s)         Comments: Pt still drives, runs errands, etc using cane.  Pt able to be generally quite active.     Hand Dominance        Extremity/Trunk Assessment   Upper Extremity Assessment: Overall WFL for tasks assessed (except R shoulder h/o RTC injury, minimal shld elevation)           Lower Extremity Assessment: Overall WFL for tasks assessed (age appropriate limitations)         Communication   Communication: No difficulties  Cognition Arousal/Alertness: Awake/alert Behavior During Therapy: WFL for tasks assessed/performed Overall Cognitive Status: Within Functional Limits for tasks assessed  General Comments      Exercises     Assessment/Plan    PT Assessment Patient needs continued PT services  PT Problem List Decreased activity tolerance;Decreased safety awareness;Decreased strength;Decreased balance          PT Treatment Interventions DME instruction;Gait training;Functional mobility training;Therapeutic activities;Balance training;Therapeutic exercise;Neuromuscular re-education    PT Goals (Current goals can be found in the Care Plan section)  Acute Rehab PT Goals Patient Stated Goal: go home PT Goal Formulation: With patient Time For Goal Achievement: 05/06/16 Potential to Achieve Goals: Good    Frequency Min 2X/week   Barriers to discharge        Co-evaluation               End of Session Equipment Utilized During Treatment: Gait belt Activity Tolerance: Patient tolerated treatment well Patient left: with chair alarm  set;with call bell/phone within reach;with family/visitor present Nurse Communication: Mobility status         Time: 1030-1314 PT Time Calculation (min) (ACUTE ONLY): 18 min   Charges:   PT Evaluation $PT Eval Low Complexity: 1 Procedure     PT G CodesKreg Shropshire, DPT 04/22/2016, 5:01 PM

## 2016-04-23 LAB — CBC
HEMATOCRIT: 39.1 % (ref 35.0–47.0)
Hemoglobin: 13.3 g/dL (ref 12.0–16.0)
MCH: 33.6 pg (ref 26.0–34.0)
MCHC: 34 g/dL (ref 32.0–36.0)
MCV: 98.7 fL (ref 80.0–100.0)
Platelets: 247 10*3/uL (ref 150–440)
RBC: 3.96 MIL/uL (ref 3.80–5.20)
RDW: 14.2 % (ref 11.5–14.5)
WBC: 9.2 10*3/uL (ref 3.6–11.0)

## 2016-04-23 LAB — BASIC METABOLIC PANEL
Anion gap: 7 (ref 5–15)
BUN: 35 mg/dL — AB (ref 6–20)
CHLORIDE: 102 mmol/L (ref 101–111)
CO2: 31 mmol/L (ref 22–32)
Calcium: 9.7 mg/dL (ref 8.9–10.3)
Creatinine, Ser: 1.89 mg/dL — ABNORMAL HIGH (ref 0.44–1.00)
GFR calc Af Amer: 25 mL/min — ABNORMAL LOW (ref >60)
GFR calc non Af Amer: 22 mL/min — ABNORMAL LOW (ref >60)
GLUCOSE: 100 mg/dL — AB (ref 65–99)
POTASSIUM: 3.5 mmol/L (ref 3.5–5.1)
Sodium: 140 mmol/L (ref 135–145)

## 2016-04-23 MED ORDER — AMLODIPINE BESYLATE 10 MG PO TABS: 10.0000 mg | ORAL_TABLET | Freq: Every day | ORAL | 0 refills | Status: AC

## 2016-04-23 MED ORDER — ASPIRIN 81 MG PO CHEW
81.0000 mg | CHEWABLE_TABLET | Freq: Every day | ORAL | Status: DC
  Administered 2016-04-23: 81 mg via ORAL
  Filled 2016-04-23: qty 1

## 2016-04-23 MED ORDER — HYDRALAZINE HCL 25 MG PO TABS: 25.0000 mg | ORAL_TABLET | Freq: Three times a day (TID) | ORAL | 1 refills | Status: DC

## 2016-04-23 MED ORDER — HYDRALAZINE HCL 25 MG PO TABS: 25.0000 mg | ORAL_TABLET | Freq: Four times a day (QID) | ORAL | 1 refills | Status: AC

## 2016-04-23 MED ORDER — METOPROLOL SUCCINATE ER 50 MG PO TB24: 50.0000 mg | ORAL_TABLET | Freq: Every day | ORAL | 1 refills | Status: AC

## 2016-04-23 NOTE — Discharge Summary (Signed)
[facility] [place] NAME: Cierria Height    MR#:  [id number]  DATE OF BIRTH:  April 12, 1920  DATE OF ADMISSION:  04/20/2016 ADMITTING PHYSICIAN: Lytle Butte, MD  DATE OF DISCHARGE: 04/23/2016  PRIMARY CARE PHYSICIAN: Kirk Ruths., MD    ADMISSION DIAGNOSIS:  High BP  DISCHARGE DIAGNOSIS:  Malignant HTN Expressive Aphasia -resolved (No CVA)  SECONDARY DIAGNOSIS:   Past Medical History:  Diagnosis Date  . Asthma   . CKD (chronic kidney disease)   . Edema   . GERD (gastroesophageal reflux disease)   . Hyperlipidemia   . Hypertension   . Osteoporosis     HOSPITAL COURSE:   82 year old Caucasian female who is remarkably pleasant with a history of hypertension presenting with high blood pressure  1. Malignant HTN:  - echo Within normal limits - cardio following appreciate Dr Tyrell Antonio input - increase amlodipine to 10 mg - Increased hydralazine QID - continue losartan, metoprolol and prn hydralazine - Stopped torsemide  2.Acute on chronic renal failure 3 - Hold torsemide -received IVF  3. Hyperlipidemia unspecified Lipitor  4. Elevated troponin: echo, likely due to supply demand ischemia due to 1  5.  Aphasia,: likely all related to blood pressure, MRI neg, Appreciate Neuro Input  PT recommends no PT needs at home  D/c home after BF. Pt agreeable CONSULTS OBTAINED:  Treatment Team:  Lytle Butte, MD Alexis Goodell, MD  DRUG ALLERGIES:   Allergies  Allergen Reactions  . Morphine And Related     DISCHARGE MEDICATIONS:   Current Discharge Medication List    START taking these medications   Details  amLODipine (NORVASC) 10 MG tablet Take 1 tablet (10 mg total) by mouth daily. Qty: 30 tablet, Refills: 0    hydrALAZINE (APRESOLINE) 25 MG tablet Take 1 tablet (25 mg total) by mouth 3 (three) times daily. Qty: 90 tablet, Refills: 1    metoprolol succinate (TOPROL-XL) 50 MG 24 hr tablet  Take 1 tablet (50 mg total) by mouth daily. Take with or immediately following a meal. Qty: 30 tablet, Refills: 1      CONTINUE these medications which have NOT CHANGED   Details  atorvastatin (LIPITOR) 80 MG tablet Take 1 tablet by mouth daily.    diphenhydramine-acetaminophen (TYLENOL PM) 25-500 MG TABS tablet Take 1 tablet by mouth at bedtime as needed.    losartan (COZAAR) 50 MG tablet Take 1 tablet by mouth daily.    Multiple Vitamin (MULTI-VITAMINS) TABS Take 1 tablet by mouth daily.    oxyCODONE-acetaminophen (PERCOCET) 10-325 MG tablet Take 1 tablet by mouth every 6 (six) hours as needed.    senna (SENOKOT) 8.6 MG tablet Take 1 tablet by mouth daily.    vitamin B-12 (CYANOCOBALAMIN) 1000 MCG tablet Take 1,000 mcg by mouth daily.    Vitamin D, Cholecalciferol, 1000 units CAPS Take 1 capsule by mouth daily.      STOP taking these medications     torsemide (DEMADEX) 10 MG tablet         If you experience worsening of your admission symptoms, develop shortness of breath, life threatening emergency, suicidal or homicidal thoughts you must seek medical attention immediately by calling 911 or calling your MD immediately  if symptoms less severe.  You Must read complete instructions/literature along with all the possible adverse reactions/side effects for all the Medicines you take and that have been prescribed to you. Take any new Medicines after you have completely understood  and accept all the possible adverse reactions/side effects.   Please note  You were cared for by a hospitalist during your hospital stay. If you have any questions about your discharge medications or the care you received while you were in the hospital after you are discharged, you can call the unit and asked to speak with the hospitalist on call if the hospitalist that took care of you is not available. Once you are discharged, your primary care physician will handle any further medical issues. Please note  that NO REFILLS for any discharge medications will be authorized once you are discharged, as it is imperative that you return to your primary care physician (or establish a relationship with a primary care physician if you do not have one) for your aftercare needs so that they can reassess your need for medications and monitor your lab values. Today   SUBJECTIVE   Doing well  VITAL SIGNS:  Blood pressure (!) 144/58, pulse 81, temperature 98.2 F (36.8 C), temperature source Oral, resp. rate 18, height 5\' 1"  (1.549 m), weight 62.1 kg (137 lb), SpO2 93 %.  I/O:   Intake/Output Summary (Last 24 hours) at 04/23/16 0710 Last data filed at 04/23/16 4650  Gross per 24 hour  Intake              360 ml  Output              750 ml  Net             -390 ml    PHYSICAL EXAMINATION:  GENERAL:  82 y.o.-year-old patient lying in the bed with no acute distress.  EYES: Pupils equal, round, reactive to light and accommodation. No scleral icterus. Extraocular muscles intact.  HEENT: Head atraumatic, normocephalic. Oropharynx and nasopharynx clear.  NECK:  Supple, no jugular venous distention. No thyroid enlargement, no tenderness.  LUNGS: Normal breath sounds bilaterally, no wheezing, rales,rhonchi or crepitation. No use of accessory muscles of respiration.  CARDIOVASCULAR: S1, S2 normal. No murmurs, rubs, or gallops.  ABDOMEN: Soft, non-tender, non-distended. Bowel sounds present. No organomegaly or mass.  EXTREMITIES: No pedal edema, cyanosis, or clubbing.  NEUROLOGIC: Cranial nerves II through XII are intact. Muscle strength 5/5 in all extremities. Sensation intact. Gait not checked.  PSYCHIATRIC: The patient is alert and oriented x 3.  SKIN: No obvious rash, lesion, or ulcer.   DATA REVIEW:   CBC   Recent Labs Lab 04/23/16 0459  WBC 9.2  HGB 13.3  HCT 39.1  PLT 247    Chemistries   Recent Labs Lab 04/20/16 1619  04/23/16 0459  NA 141  < > 140  K 3.5  < > 3.5  CL 105  < > 102   CO2 27  < > 31  GLUCOSE 107*  < > 100*  BUN 14  < > 35*  CREATININE 1.41*  < > 1.89*  CALCIUM 10.2  < > 9.7  AST 18  --   --   ALT 9*  --   --   ALKPHOS 68  --   --   BILITOT 0.6  --   --   < > = values in this interval not displayed.  Microbiology Results   No results found for this or any previous visit (from the past 240 hour(s)).  RADIOLOGY:  No results found.   Management plans discussed with the patient, family and they are in agreement.  CODE STATUS:     Code Status Orders  Start     Ordered   04/20/16 1854  Full code  Continuous     04/20/16 1854    Code Status History    Date Active Date Inactive Code Status Order ID Comments User Context   This patient has a current code status but no historical code status.      TOTAL TIME TAKING CARE OF THIS PATIENT: 40 minutes.    Allyiah Gartner M.D on 04/23/2016 at 7:10 AM  Between 7am to 6pm - Pager - [phone] After 6pm go to [email or web address] - password [email or web address] [facility]  Office  [phone]  CC: Primary care physician; Kirk Ruths., MD

## 2016-04-23 NOTE — Progress Notes (Signed)
Pt to be discharged back to [facility] today. Iv and tele removed. disch instructions given to pt and friend. disch via w.c.

## 2016-04-23 NOTE — Discharge Instructions (Signed)
Keep log of BP at home °

## 2016-04-27 DIAGNOSIS — R278 Other lack of coordination: Secondary | ICD-10-CM | POA: Diagnosis not present

## 2016-04-27 DIAGNOSIS — M6281 Muscle weakness (generalized): Secondary | ICD-10-CM | POA: Diagnosis not present

## 2016-04-27 DIAGNOSIS — R2681 Unsteadiness on feet: Secondary | ICD-10-CM | POA: Diagnosis not present

## 2016-04-28 DIAGNOSIS — N183 Chronic kidney disease, stage 3 (moderate): Secondary | ICD-10-CM | POA: Diagnosis not present

## 2016-04-28 DIAGNOSIS — I10 Essential (primary) hypertension: Secondary | ICD-10-CM | POA: Diagnosis not present

## 2016-04-28 DIAGNOSIS — R2681 Unsteadiness on feet: Secondary | ICD-10-CM | POA: Diagnosis not present

## 2016-04-28 DIAGNOSIS — R4701 Aphasia: Secondary | ICD-10-CM | POA: Diagnosis not present

## 2016-04-28 DIAGNOSIS — M6281 Muscle weakness (generalized): Secondary | ICD-10-CM | POA: Diagnosis not present

## 2016-04-28 DIAGNOSIS — R278 Other lack of coordination: Secondary | ICD-10-CM | POA: Diagnosis not present

## 2016-04-28 DIAGNOSIS — M810 Age-related osteoporosis without current pathological fracture: Secondary | ICD-10-CM | POA: Diagnosis not present

## 2016-04-28 DIAGNOSIS — E785 Hyperlipidemia, unspecified: Secondary | ICD-10-CM | POA: Diagnosis not present

## 2016-04-28 DIAGNOSIS — G459 Transient cerebral ischemic attack, unspecified: Secondary | ICD-10-CM | POA: Diagnosis not present

## 2016-04-29 DIAGNOSIS — R2681 Unsteadiness on feet: Secondary | ICD-10-CM | POA: Diagnosis not present

## 2016-04-29 DIAGNOSIS — R278 Other lack of coordination: Secondary | ICD-10-CM | POA: Diagnosis not present

## 2016-04-29 DIAGNOSIS — M6281 Muscle weakness (generalized): Secondary | ICD-10-CM | POA: Diagnosis not present

## 2016-05-02 DIAGNOSIS — N183 Chronic kidney disease, stage 3 (moderate): Secondary | ICD-10-CM | POA: Diagnosis not present

## 2016-05-02 DIAGNOSIS — R2681 Unsteadiness on feet: Secondary | ICD-10-CM | POA: Diagnosis not present

## 2016-05-02 DIAGNOSIS — R278 Other lack of coordination: Secondary | ICD-10-CM | POA: Diagnosis not present

## 2016-05-02 DIAGNOSIS — M6281 Muscle weakness (generalized): Secondary | ICD-10-CM | POA: Diagnosis not present

## 2016-05-03 DIAGNOSIS — M6281 Muscle weakness (generalized): Secondary | ICD-10-CM | POA: Diagnosis not present

## 2016-05-03 DIAGNOSIS — R2681 Unsteadiness on feet: Secondary | ICD-10-CM | POA: Diagnosis not present

## 2016-05-03 DIAGNOSIS — R278 Other lack of coordination: Secondary | ICD-10-CM | POA: Diagnosis not present

## 2016-05-04 DIAGNOSIS — M6281 Muscle weakness (generalized): Secondary | ICD-10-CM | POA: Diagnosis not present

## 2016-05-04 DIAGNOSIS — R278 Other lack of coordination: Secondary | ICD-10-CM | POA: Diagnosis not present

## 2016-05-04 DIAGNOSIS — R2681 Unsteadiness on feet: Secondary | ICD-10-CM | POA: Diagnosis not present

## 2016-05-05 DIAGNOSIS — R2681 Unsteadiness on feet: Secondary | ICD-10-CM | POA: Diagnosis not present

## 2016-05-05 DIAGNOSIS — M6281 Muscle weakness (generalized): Secondary | ICD-10-CM | POA: Diagnosis not present

## 2016-05-05 DIAGNOSIS — R278 Other lack of coordination: Secondary | ICD-10-CM | POA: Diagnosis not present

## 2016-05-06 DIAGNOSIS — R2681 Unsteadiness on feet: Secondary | ICD-10-CM | POA: Diagnosis not present

## 2016-05-06 DIAGNOSIS — M6281 Muscle weakness (generalized): Secondary | ICD-10-CM | POA: Diagnosis not present

## 2016-05-06 DIAGNOSIS — R278 Other lack of coordination: Secondary | ICD-10-CM | POA: Diagnosis not present

## 2016-05-08 DIAGNOSIS — R2681 Unsteadiness on feet: Secondary | ICD-10-CM | POA: Diagnosis not present

## 2016-05-08 DIAGNOSIS — M6281 Muscle weakness (generalized): Secondary | ICD-10-CM | POA: Diagnosis not present

## 2016-05-08 DIAGNOSIS — R278 Other lack of coordination: Secondary | ICD-10-CM | POA: Diagnosis not present

## 2016-05-09 DIAGNOSIS — R278 Other lack of coordination: Secondary | ICD-10-CM | POA: Diagnosis not present

## 2016-05-09 DIAGNOSIS — M6281 Muscle weakness (generalized): Secondary | ICD-10-CM | POA: Diagnosis not present

## 2016-05-09 DIAGNOSIS — R2681 Unsteadiness on feet: Secondary | ICD-10-CM | POA: Diagnosis not present

## 2016-05-10 DIAGNOSIS — R2681 Unsteadiness on feet: Secondary | ICD-10-CM | POA: Diagnosis not present

## 2016-05-10 DIAGNOSIS — M6281 Muscle weakness (generalized): Secondary | ICD-10-CM | POA: Diagnosis not present

## 2016-05-10 DIAGNOSIS — R278 Other lack of coordination: Secondary | ICD-10-CM | POA: Diagnosis not present

## 2016-05-11 DIAGNOSIS — M6281 Muscle weakness (generalized): Secondary | ICD-10-CM | POA: Diagnosis not present

## 2016-05-11 DIAGNOSIS — R278 Other lack of coordination: Secondary | ICD-10-CM | POA: Diagnosis not present

## 2016-05-11 DIAGNOSIS — R2681 Unsteadiness on feet: Secondary | ICD-10-CM | POA: Diagnosis not present

## 2016-05-11 DIAGNOSIS — B351 Tinea unguium: Secondary | ICD-10-CM | POA: Diagnosis not present

## 2016-05-12 DIAGNOSIS — M6281 Muscle weakness (generalized): Secondary | ICD-10-CM | POA: Diagnosis not present

## 2016-05-12 DIAGNOSIS — R2681 Unsteadiness on feet: Secondary | ICD-10-CM | POA: Diagnosis not present

## 2016-05-12 DIAGNOSIS — R278 Other lack of coordination: Secondary | ICD-10-CM | POA: Diagnosis not present

## 2016-05-13 DIAGNOSIS — R278 Other lack of coordination: Secondary | ICD-10-CM | POA: Diagnosis not present

## 2016-05-13 DIAGNOSIS — R2681 Unsteadiness on feet: Secondary | ICD-10-CM | POA: Diagnosis not present

## 2016-05-13 DIAGNOSIS — M6281 Muscle weakness (generalized): Secondary | ICD-10-CM | POA: Diagnosis not present

## 2016-05-18 DIAGNOSIS — R2681 Unsteadiness on feet: Secondary | ICD-10-CM | POA: Diagnosis not present

## 2016-05-18 DIAGNOSIS — R2689 Other abnormalities of gait and mobility: Secondary | ICD-10-CM | POA: Diagnosis not present

## 2016-05-18 DIAGNOSIS — M6281 Muscle weakness (generalized): Secondary | ICD-10-CM | POA: Diagnosis not present

## 2016-05-19 DIAGNOSIS — R2689 Other abnormalities of gait and mobility: Secondary | ICD-10-CM | POA: Diagnosis not present

## 2016-05-19 DIAGNOSIS — M6281 Muscle weakness (generalized): Secondary | ICD-10-CM | POA: Diagnosis not present

## 2016-05-19 DIAGNOSIS — R2681 Unsteadiness on feet: Secondary | ICD-10-CM | POA: Diagnosis not present

## 2016-05-20 DIAGNOSIS — M6281 Muscle weakness (generalized): Secondary | ICD-10-CM | POA: Diagnosis not present

## 2016-05-20 DIAGNOSIS — R2689 Other abnormalities of gait and mobility: Secondary | ICD-10-CM | POA: Diagnosis not present

## 2016-05-20 DIAGNOSIS — R2681 Unsteadiness on feet: Secondary | ICD-10-CM | POA: Diagnosis not present

## 2016-05-23 DIAGNOSIS — R4189 Other symptoms and signs involving cognitive functions and awareness: Secondary | ICD-10-CM | POA: Diagnosis not present

## 2016-05-23 DIAGNOSIS — R2681 Unsteadiness on feet: Secondary | ICD-10-CM | POA: Diagnosis not present

## 2016-05-23 DIAGNOSIS — R2689 Other abnormalities of gait and mobility: Secondary | ICD-10-CM | POA: Diagnosis not present

## 2016-05-24 DIAGNOSIS — R4189 Other symptoms and signs involving cognitive functions and awareness: Secondary | ICD-10-CM | POA: Diagnosis not present

## 2016-05-24 DIAGNOSIS — R2689 Other abnormalities of gait and mobility: Secondary | ICD-10-CM | POA: Diagnosis not present

## 2016-05-24 DIAGNOSIS — R2681 Unsteadiness on feet: Secondary | ICD-10-CM | POA: Diagnosis not present

## 2016-05-25 DIAGNOSIS — R4189 Other symptoms and signs involving cognitive functions and awareness: Secondary | ICD-10-CM | POA: Diagnosis not present

## 2016-05-25 DIAGNOSIS — R2689 Other abnormalities of gait and mobility: Secondary | ICD-10-CM | POA: Diagnosis not present

## 2016-05-25 DIAGNOSIS — R2681 Unsteadiness on feet: Secondary | ICD-10-CM | POA: Diagnosis not present

## 2016-05-26 DIAGNOSIS — R2681 Unsteadiness on feet: Secondary | ICD-10-CM | POA: Diagnosis not present

## 2016-05-26 DIAGNOSIS — R4189 Other symptoms and signs involving cognitive functions and awareness: Secondary | ICD-10-CM | POA: Diagnosis not present

## 2016-05-26 DIAGNOSIS — R2689 Other abnormalities of gait and mobility: Secondary | ICD-10-CM | POA: Diagnosis not present

## 2016-05-27 DIAGNOSIS — R2689 Other abnormalities of gait and mobility: Secondary | ICD-10-CM | POA: Diagnosis not present

## 2016-05-27 DIAGNOSIS — R2681 Unsteadiness on feet: Secondary | ICD-10-CM | POA: Diagnosis not present

## 2016-05-27 DIAGNOSIS — R4189 Other symptoms and signs involving cognitive functions and awareness: Secondary | ICD-10-CM | POA: Diagnosis not present

## 2016-05-30 DIAGNOSIS — R2681 Unsteadiness on feet: Secondary | ICD-10-CM | POA: Diagnosis not present

## 2016-05-30 DIAGNOSIS — R2689 Other abnormalities of gait and mobility: Secondary | ICD-10-CM | POA: Diagnosis not present

## 2016-05-30 DIAGNOSIS — R4189 Other symptoms and signs involving cognitive functions and awareness: Secondary | ICD-10-CM | POA: Diagnosis not present

## 2016-05-31 DIAGNOSIS — R2689 Other abnormalities of gait and mobility: Secondary | ICD-10-CM | POA: Diagnosis not present

## 2016-05-31 DIAGNOSIS — R4189 Other symptoms and signs involving cognitive functions and awareness: Secondary | ICD-10-CM | POA: Diagnosis not present

## 2016-05-31 DIAGNOSIS — R2681 Unsteadiness on feet: Secondary | ICD-10-CM | POA: Diagnosis not present

## 2016-06-01 DIAGNOSIS — R4189 Other symptoms and signs involving cognitive functions and awareness: Secondary | ICD-10-CM | POA: Diagnosis not present

## 2016-06-01 DIAGNOSIS — R2681 Unsteadiness on feet: Secondary | ICD-10-CM | POA: Diagnosis not present

## 2016-06-01 DIAGNOSIS — R2689 Other abnormalities of gait and mobility: Secondary | ICD-10-CM | POA: Diagnosis not present

## 2016-06-02 DIAGNOSIS — R2689 Other abnormalities of gait and mobility: Secondary | ICD-10-CM | POA: Diagnosis not present

## 2016-06-02 DIAGNOSIS — R4189 Other symptoms and signs involving cognitive functions and awareness: Secondary | ICD-10-CM | POA: Diagnosis not present

## 2016-06-02 DIAGNOSIS — R2681 Unsteadiness on feet: Secondary | ICD-10-CM | POA: Diagnosis not present

## 2016-06-03 DIAGNOSIS — R4189 Other symptoms and signs involving cognitive functions and awareness: Secondary | ICD-10-CM | POA: Diagnosis not present

## 2016-06-03 DIAGNOSIS — I10 Essential (primary) hypertension: Secondary | ICD-10-CM | POA: Diagnosis not present

## 2016-06-03 DIAGNOSIS — R2689 Other abnormalities of gait and mobility: Secondary | ICD-10-CM | POA: Diagnosis not present

## 2016-06-03 DIAGNOSIS — E785 Hyperlipidemia, unspecified: Secondary | ICD-10-CM | POA: Diagnosis not present

## 2016-06-03 DIAGNOSIS — M810 Age-related osteoporosis without current pathological fracture: Secondary | ICD-10-CM

## 2016-06-03 DIAGNOSIS — R4701 Aphasia: Secondary | ICD-10-CM | POA: Diagnosis not present

## 2016-06-03 DIAGNOSIS — R2681 Unsteadiness on feet: Secondary | ICD-10-CM | POA: Diagnosis not present

## 2016-06-03 DIAGNOSIS — N183 Chronic kidney disease, stage 3 (moderate): Secondary | ICD-10-CM | POA: Diagnosis not present

## 2016-06-06 DIAGNOSIS — J069 Acute upper respiratory infection, unspecified: Secondary | ICD-10-CM | POA: Diagnosis not present

## 2016-06-21 ENCOUNTER — Encounter: Payer: Self-pay | Admitting: Internal Medicine

## 2016-06-23 ENCOUNTER — Encounter: Payer: Self-pay | Admitting: Internal Medicine

## 2016-06-23 ENCOUNTER — Non-Acute Institutional Stay: Payer: Medicare Other | Admitting: Internal Medicine

## 2016-06-23 VITALS — BP 145/60 | HR 85 | Temp 99.3°F | Resp 18 | Wt 134.0 lb

## 2016-06-23 DIAGNOSIS — N183 Chronic kidney disease, stage 3 unspecified: Secondary | ICD-10-CM

## 2016-06-23 DIAGNOSIS — R4701 Aphasia: Secondary | ICD-10-CM

## 2016-06-23 DIAGNOSIS — M159 Polyosteoarthritis, unspecified: Secondary | ICD-10-CM | POA: Diagnosis not present

## 2016-06-23 DIAGNOSIS — I10 Essential (primary) hypertension: Secondary | ICD-10-CM | POA: Diagnosis not present

## 2016-06-23 DIAGNOSIS — G479 Sleep disorder, unspecified: Secondary | ICD-10-CM | POA: Insufficient documentation

## 2016-06-23 NOTE — Assessment & Plan Note (Signed)
On losartan

## 2016-06-23 NOTE — Assessment & Plan Note (Signed)
Including past vertebral fractures Will resume regular percocet as she took before

## 2016-06-23 NOTE — Assessment & Plan Note (Signed)
BP Readings from Last 3 Encounters:  06/23/16 (!) 145/60  04/23/16 (!) 148/60   Better now Some edema with the amlodipine No change for now---don't want to overtreat

## 2016-06-23 NOTE — Assessment & Plan Note (Signed)
Sleeping well on the melatonin now

## 2016-06-23 NOTE — Assessment & Plan Note (Signed)
Resolved ??TIA Will start ASA every other day

## 2016-06-23 NOTE — Progress Notes (Signed)
Subjective:    Patient ID: Jacqueline Sanchez, female    DOB: July 15, 1919, 83 y.o.   MRN: [id number]  HPI First visit in assisted living--hospital and then rehab follow up Reviewed status with [name] RN Overall has adjusted here well  Admitted with aphasia and accelerated HTN No clear stroke and it improved No recurrence of the aphasia Did have GI bug recently --but this is improved Seems to have adjusted here fairly well She had been having trouble keeping up with food, etc --when still living independently  Still with some cough Had some cold symptoms while in health care No SOB No fever  Walking with cane Mild edema in feet and ankles No chest pain No dizziness or syncope  Chronic joint pain---mostly left hip/leg and right shoulder Vertebral fractures and past kyphoplasty Needs the oxycodone regularly---this was how she was doing it at home  Current Outpatient Prescriptions on File Prior to Visit  Medication Sig Dispense Refill  . amLODipine (NORVASC) 10 MG tablet Take 1 tablet (10 mg total) by mouth daily. 30 tablet 0  . atorvastatin (LIPITOR) 80 MG tablet Take 1 tablet by mouth daily.    . hydrALAZINE (APRESOLINE) 25 MG tablet Take 1 tablet (25 mg total) by mouth 4 (four) times daily. 120 tablet 1  . losartan (COZAAR) 50 MG tablet Take 1 tablet by mouth daily.    . metoprolol succinate (TOPROL-XL) 50 MG 24 hr tablet Take 1 tablet (50 mg total) by mouth daily. Take with or immediately following a meal. 30 tablet 1  . Multiple Vitamin (MULTI-VITAMINS) TABS Take 1 tablet by mouth daily.    Marland Kitchen oxyCODONE-acetaminophen (PERCOCET) 10-325 MG tablet Take 0.5-1 tablets by mouth every 6 (six) hours as needed.     . senna (SENOKOT) 8.6 MG tablet Take 2 tablets by mouth 2 (two) times daily.     . vitamin B-12 (CYANOCOBALAMIN) 1000 MCG tablet Take 1,000 mcg by mouth daily.    . Vitamin D, Cholecalciferol, 1000 units CAPS Take 1 capsule by mouth daily.     No current  facility-administered medications on file prior to visit.     Allergies  Allergen Reactions  . Morphine And Related Nausea Only    Past Medical History:  Diagnosis Date  . Aphasia 04/2016   Transient---??TIA  . Asthma   . CKD (chronic kidney disease)   . Edema   . GERD (gastroesophageal reflux disease)   . Hyperlipidemia   . Hypertension   . Osteoporosis     Past Surgical History:  Procedure Laterality Date  . KYPHOPLASTY      Family History  Problem Relation Age of Onset  . Hypertension Other     Social History   Social History  . Marital status: Widowed    Spouse name: N/A  . Number of children: 2  . Years of education: N/A   Occupational History  . 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 Smoking status: Never Smoker  . Smokeless tobacco: Never Used  . Alcohol use No  . Drug use: Unknown  . Sexual activity: Not on file   Other Topics Concern  . Not on file   Social History Narrative   Widowed 2000   Has living will   Sons/DILs are health care POAs (mostly Mike/Jan)   Has DNR   No tube feeds if cognitively unaware   Review of Systems Appetite is improving Weight down with the GI but--but recovering Sleeps okay  Bowels are good No skin problems    Objective:   Physical Exam  Neck: No thyromegaly present.  Cardiovascular: Normal rate and regular rhythm.   Gr 3/6 systolic murmur loudest at apex (MR)  Pulmonary/Chest: Effort normal and breath sounds normal. No respiratory distress. She has no wheezes. She has no rales.  Abdominal: Soft. There is no tenderness.  Musculoskeletal:  Trace edema  Lymphadenopathy:    She has no cervical adenopathy.  Skin: No rash noted.  Psychiatric: She has a normal mood and affect. Her behavior is normal.          Assessment & Plan:

## 2016-08-03 ENCOUNTER — Non-Acute Institutional Stay: Payer: Medicare Other | Admitting: Internal Medicine

## 2016-08-03 ENCOUNTER — Encounter: Payer: Self-pay | Admitting: Internal Medicine

## 2016-08-03 VITALS — BP 130/82 | HR 76

## 2016-08-03 DIAGNOSIS — B351 Tinea unguium: Secondary | ICD-10-CM | POA: Diagnosis not present

## 2016-08-03 DIAGNOSIS — M79674 Pain in right toe(s): Secondary | ICD-10-CM | POA: Diagnosis not present

## 2016-08-03 DIAGNOSIS — M79675 Pain in left toe(s): Secondary | ICD-10-CM

## 2016-08-03 NOTE — Progress Notes (Signed)
Subjective:    Patient ID: Jacqueline Sanchez, female    DOB: 1919-10-15, 83 y.o.   MRN: [id number]  HPI  Asked to check resident  Wanting nails trimmed Thick, long and growing into nailbeds causing pain.  Review of Systems      Past Medical History:  Diagnosis Date  . Aphasia 04/2016   Transient---??TIA  . Asthma   . CKD (chronic kidney disease)   . Edema   . GERD (gastroesophageal reflux disease)   . Hyperlipidemia   . Hypertension   . Osteoporosis     Current Outpatient Prescriptions  Medication Sig Dispense Refill  . amLODipine (NORVASC) 10 MG tablet Take 1 tablet (10 mg total) by mouth daily. 30 tablet 0  . aspirin EC 81 MG tablet Take 81 mg by mouth every other day.    Marland Kitchen atorvastatin (LIPITOR) 80 MG tablet Take 1 tablet by mouth daily.    . hydrALAZINE (APRESOLINE) 25 MG tablet Take 1 tablet (25 mg total) by mouth 4 (four) times daily. 120 tablet 1  . losartan (COZAAR) 50 MG tablet Take 1 tablet by mouth daily.    . Melatonin 3 MG TABS Take 1 tablet by mouth at bedtime.    . metoprolol succinate (TOPROL-XL) 50 MG 24 hr tablet Take 1 tablet (50 mg total) by mouth daily. Take with or immediately following a meal. 30 tablet 1  . Multiple Vitamin (MULTI-VITAMINS) TABS Take 1 tablet by mouth daily.    Marland Kitchen oxyCODONE-acetaminophen (PERCOCET/ROXICET) 5-325 MG tablet Take 1 tablet by mouth 2 (two) times daily. And BID prn    . polyethylene glycol (MIRALAX / GLYCOLAX) packet Take 17 g by mouth daily.    Marland Kitchen senna (SENOKOT) 8.6 MG tablet Take 2 tablets by mouth 2 (two) times daily.     . vitamin B-12 (CYANOCOBALAMIN) 1000 MCG tablet Take 1,000 mcg by mouth daily.    . Vitamin D, Cholecalciferol, 1000 units CAPS Take 1 capsule by mouth daily.     No current facility-administered medications for this visit.     Allergies  Allergen Reactions  . Morphine And Related Nausea Only    Family History  Problem Relation Age of Onset  . Hypertension Other     Social History    Social History  . Marital status: Widowed    Spouse name: N/A  . Number of children: 2  . Years of education: N/A   Occupational History  . 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 Smoking status: Never Smoker  . Smokeless tobacco: Never Used  . Alcohol use No  . Drug use: Unknown  . Sexual activity: Not on file   Other Topics Concern  . Not on file   Social History Narrative   Widowed 2000   Has living will   Sons/DILs are health care POAs (mostly Mike/Jan)   Has DNR   No tube feeds if cognitively unaware     Skin: Pt reports mycotic toenails. Denies redness, rashes, lesions or ulcercations.    No other specific complaints in a complete review of systems (except as listed in HPI above).  Objective:   Physical Exam    BP 130/82   Pulse 76  Wt Readings from Last 3 Encounters:  06/23/16 134 lb (60.8 kg)  04/20/16 137 lb (62.1 kg)    General: Appears herstated age, in NAD. Skin: Thick, mycotic discolored toenails.  BMET    Component Value Date/Time   NA 140  04/23/2016 0459   NA 140 05/30/2011 1101   K 3.5 04/23/2016 0459   K 4.3 05/30/2011 1101   CL 102 04/23/2016 0459   CL 99 05/30/2011 1101   CO2 31 04/23/2016 0459   CO2 30 05/30/2011 1101   GLUCOSE 100 (H) 04/23/2016 0459   GLUCOSE 101 (H) 05/30/2011 1101   BUN 35 (H) 04/23/2016 0459   BUN 20 (H) 05/30/2011 1101   CREATININE 1.89 (H) 04/23/2016 0459   CREATININE 1.69 (H) 11/18/2011 1535   CALCIUM 9.7 04/23/2016 0459   CALCIUM 11.0 (H) 05/30/2011 1101   GFRNONAA 22 (L) 04/23/2016 0459   GFRNONAA 26 (L) 11/18/2011 1535   GFRAA 25 (L) 04/23/2016 0459   GFRAA 31 (L) 11/18/2011 1535    Lipid Panel  No results found for: CHOL, TRIG, HDL, CHOLHDL, VLDL, LDLCALC  CBC    Component Value Date/Time   WBC 9.2 04/23/2016 0459   RBC 3.96 04/23/2016 0459   HGB 13.3 04/23/2016 0459   HGB 12.7 05/30/2011 1101   HCT 39.1 04/23/2016 0459   HCT 37.8 05/30/2011 1101   PLT 247  04/23/2016 0459   PLT 237 05/30/2011 1101   MCV 98.7 04/23/2016 0459   MCV 101 (H) 05/30/2011 1101   MCH 33.6 04/23/2016 0459   MCHC 34.0 04/23/2016 0459   RDW 14.2 04/23/2016 0459   RDW 13.5 05/30/2011 1101   LYMPHSABS 1.4 04/20/2016 1619   MONOABS 0.7 04/20/2016 1619   EOSABS 0.0 04/20/2016 1619   BASOSABS 0.1 04/20/2016 1619    Hgb A1C No results found for: HGBA1C        Assessment & Plan:   Mycotic Toenails:  Trimmed by provider using dremmel tool  Will reassess as needed Webb Silversmith, NP

## 2016-08-03 NOTE — Patient Instructions (Signed)

## 2016-09-06 ENCOUNTER — Emergency Department: Payer: Medicare Other

## 2016-09-06 ENCOUNTER — Encounter: Payer: Self-pay | Admitting: Emergency Medicine

## 2016-09-06 ENCOUNTER — Inpatient Hospital Stay
Admission: EM | Admit: 2016-09-06 | Discharge: 2016-09-10 | DRG: 470 | Disposition: A | Discharge status: Skilled Nursing Facility | Payer: Medicare Other | Attending: Specialist | Admitting: Specialist

## 2016-09-06 DIAGNOSIS — Z79891 Long term (current) use of opiate analgesic: Secondary | ICD-10-CM

## 2016-09-06 DIAGNOSIS — Z419 Encounter for procedure for purposes other than remedying health state, unspecified: Secondary | ICD-10-CM

## 2016-09-06 DIAGNOSIS — S299XXA Unspecified injury of thorax, initial encounter: Secondary | ICD-10-CM | POA: Diagnosis not present

## 2016-09-06 DIAGNOSIS — Z66 Do not resuscitate: Secondary | ICD-10-CM | POA: Diagnosis present

## 2016-09-06 DIAGNOSIS — M79672 Pain in left foot: Secondary | ICD-10-CM

## 2016-09-06 DIAGNOSIS — S72042A Displaced fracture of base of neck of left femur, initial encounter for closed fracture: Principal | ICD-10-CM | POA: Diagnosis present

## 2016-09-06 DIAGNOSIS — N183 Chronic kidney disease, stage 3 (moderate): Secondary | ICD-10-CM | POA: Diagnosis present

## 2016-09-06 DIAGNOSIS — J45909 Unspecified asthma, uncomplicated: Secondary | ICD-10-CM | POA: Diagnosis present

## 2016-09-06 DIAGNOSIS — S0532XA Ocular laceration without prolapse or loss of intraocular tissue, left eye, initial encounter: Secondary | ICD-10-CM | POA: Diagnosis not present

## 2016-09-06 DIAGNOSIS — M79605 Pain in left leg: Secondary | ICD-10-CM

## 2016-09-06 DIAGNOSIS — S79912A Unspecified injury of left hip, initial encounter: Secondary | ICD-10-CM | POA: Diagnosis not present

## 2016-09-06 DIAGNOSIS — K219 Gastro-esophageal reflux disease without esophagitis: Secondary | ICD-10-CM | POA: Diagnosis not present

## 2016-09-06 DIAGNOSIS — S7292XA Unspecified fracture of left femur, initial encounter for closed fracture: Secondary | ICD-10-CM

## 2016-09-06 DIAGNOSIS — E785 Hyperlipidemia, unspecified: Secondary | ICD-10-CM | POA: Diagnosis not present

## 2016-09-06 DIAGNOSIS — Z7982 Long term (current) use of aspirin: Secondary | ICD-10-CM

## 2016-09-06 DIAGNOSIS — Z885 Allergy status to narcotic agent status: Secondary | ICD-10-CM

## 2016-09-06 DIAGNOSIS — Z8781 Personal history of (healed) traumatic fracture: Secondary | ICD-10-CM

## 2016-09-06 DIAGNOSIS — I10 Essential (primary) hypertension: Secondary | ICD-10-CM | POA: Diagnosis not present

## 2016-09-06 DIAGNOSIS — M810 Age-related osteoporosis without current pathological fracture: Secondary | ICD-10-CM | POA: Diagnosis not present

## 2016-09-06 DIAGNOSIS — I129 Hypertensive chronic kidney disease with stage 1 through stage 4 chronic kidney disease, or unspecified chronic kidney disease: Secondary | ICD-10-CM | POA: Diagnosis present

## 2016-09-06 DIAGNOSIS — S72002A Fracture of unspecified part of neck of left femur, initial encounter for closed fracture: Secondary | ICD-10-CM | POA: Diagnosis not present

## 2016-09-06 DIAGNOSIS — D62 Acute posthemorrhagic anemia: Secondary | ICD-10-CM | POA: Diagnosis not present

## 2016-09-06 DIAGNOSIS — W19XXXA Unspecified fall, initial encounter: Secondary | ICD-10-CM | POA: Diagnosis not present

## 2016-09-06 DIAGNOSIS — S9032XA Contusion of left foot, initial encounter: Secondary | ICD-10-CM | POA: Diagnosis not present

## 2016-09-06 DIAGNOSIS — Y92009 Unspecified place in unspecified non-institutional (private) residence as the place of occurrence of the external cause: Secondary | ICD-10-CM

## 2016-09-06 DIAGNOSIS — M7989 Other specified soft tissue disorders: Secondary | ICD-10-CM | POA: Diagnosis not present

## 2016-09-06 DIAGNOSIS — Z8249 Family history of ischemic heart disease and other diseases of the circulatory system: Secondary | ICD-10-CM

## 2016-09-06 DIAGNOSIS — M25552 Pain in left hip: Secondary | ICD-10-CM | POA: Diagnosis not present

## 2016-09-06 DIAGNOSIS — Y92092 Bedroom in other non-institutional residence as the place of occurrence of the external cause: Secondary | ICD-10-CM

## 2016-09-06 DIAGNOSIS — Z8673 Personal history of transient ischemic attack (TIA), and cerebral infarction without residual deficits: Secondary | ICD-10-CM

## 2016-09-06 DIAGNOSIS — S72009A Fracture of unspecified part of neck of unspecified femur, initial encounter for closed fracture: Secondary | ICD-10-CM

## 2016-09-06 DIAGNOSIS — Z90710 Acquired absence of both cervix and uterus: Secondary | ICD-10-CM

## 2016-09-06 DIAGNOSIS — S0990XA Unspecified injury of head, initial encounter: Secondary | ICD-10-CM | POA: Diagnosis not present

## 2016-09-06 DIAGNOSIS — Z01818 Encounter for other preprocedural examination: Secondary | ICD-10-CM | POA: Diagnosis not present

## 2016-09-06 DIAGNOSIS — W010XXA Fall on same level from slipping, tripping and stumbling without subsequent striking against object, initial encounter: Secondary | ICD-10-CM | POA: Diagnosis present

## 2016-09-06 HISTORY — DX: Unspecified fracture of left femur, initial encounter for closed fracture: S72.92XA

## 2016-09-06 MED ORDER — FENTANYL CITRATE (PF) 100 MCG/2ML IJ SOLN
25.0000 ug | Freq: Once | INTRAMUSCULAR | Status: AC
  Administered 2016-09-07: 25 ug via INTRAVENOUS
  Filled 2016-09-06: qty 2

## 2016-09-06 MED ORDER — ONDANSETRON HCL 4 MG/2ML IJ SOLN
4.0000 mg | Freq: Once | INTRAMUSCULAR | Status: AC
  Administered 2016-09-07: 4 mg via INTRAVENOUS
  Filled 2016-09-06: qty 2

## 2016-09-06 NOTE — ED Triage Notes (Signed)
Pt arrived via ems from [facility]. Pt had mechanical fall today at 1630 pt denies loosing consciousness.Pt alert and oriented.  Pt has laceration to left eye and bruising to left foot and knee. Pt reports pain is in her thigh and hip on the left side.

## 2016-09-06 NOTE — ED Provider Notes (Signed)
-----------------------------------------   11:53 PM on 09/06/2016 -----------------------------------------  Assumed care of patient who presents status post mechanical fall this afternoon. Hip x-ray with femoral fracture. Will consult orthopedics and discuss with hospitalist to evaluate patient in the emergency department for admission.  CT head interpreted per Dr. Gerilyn Nestle: No acute intracranial abnormalities. Chronic atrophy. Cystic lesion  in the right anterior frontal lobe without change since previous  study, likely benign.     Chest x-ray interpreted per Dr. Gerilyn Nestle: Emphysematous changes in the lungs. Cardiac enlargement. No evidence  of active pulmonary disease.   Left foot x-rays interpreted per Dr. Gerilyn Nestle: Diffuse bone demineralization. Dorsal soft tissue swelling. No acute  fractures identified.   Left hip x-rays interpreted per Dr. Gerilyn Nestle: Acute transverse fracture of the base of the left femoral neck with  probable extension into the lesser trochanteric region. Varus  angulation of the fracture fragments.   ED ECG REPORT I, Kessa Fairbairn J, the attending physician, personally viewed and interpreted this ECG.   Date: 09/07/2016  EKG Time: 2350  Rate: 100  Rhythm: normal EKG, normal sinus rhythm  Axis: Normal  Intervals:none  ST&T Change: Nonspecific    Paulette Blanch, MD 09/07/16 [id number]

## 2016-09-06 NOTE — ED Provider Notes (Signed)
[facility] Emergency Department Provider Note   ____________________________________________   I have reviewed the triage vital signs and the nursing notes.   HISTORY  Chief Complaint Fall and Leg Pain   History limited by: Not Limited   HPI Jacqueline Sanchez is a 83 y.o. female who presents to the emergency department today because of concerns for left hip pain after a fall. Fall happened earlier this afternoon. The patient states that she tripped on a bed sheet. She has since had left hip pain. She is also complaining of some left foot pain. Patient did suffer a small laceration to her left temple but denies any significant headache. Patient denies any neck pain. She did not black out. She denied any chest pain or palpitations at the time of the fall. She denies any recent illness.   Past Medical History:  Diagnosis Date  . Aphasia 04/2016   Transient---??TIA  . Asthma   . CKD (chronic kidney disease)   . Edema   . GERD (gastroesophageal reflux disease)   . Hyperlipidemia   . Hypertension   . Osteoporosis     Patient Active Problem List   Diagnosis Date Noted  . Essential hypertension, benign 06/23/2016  . Generalized osteoarthritis of multiple sites 06/23/2016  . Sleep disturbance 06/23/2016  . Aphasia 04/21/2016  . CKD (chronic kidney disease), stage III 04/21/2016    Past Surgical History:  Procedure Laterality Date  . KYPHOPLASTY      Prior to Admission medications   Medication Sig Start Date End Date Taking? Authorizing Provider  amLODipine (NORVASC) 10 MG tablet Take 1 tablet (10 mg total) by mouth daily. 04/23/16   Fritzi Mandes, MD  aspirin EC 81 MG tablet Take 81 mg by mouth every other day.    Historical Provider, MD  atorvastatin (LIPITOR) 80 MG tablet Take 1 tablet by mouth daily.    Historical Provider, MD  hydrALAZINE (APRESOLINE) 25 MG tablet Take 1 tablet (25 mg total) by mouth 4 (four) times daily. 04/23/16   Fritzi Mandes, MD   losartan (COZAAR) 50 MG tablet Take 1 tablet by mouth daily. 03/08/16   Historical Provider, MD  Melatonin 3 MG TABS Take 1 tablet by mouth at bedtime.    Historical Provider, MD  metoprolol succinate (TOPROL-XL) 50 MG 24 hr tablet Take 1 tablet (50 mg total) by mouth daily. Take with or immediately following a meal. 04/23/16   Fritzi Mandes, MD  Multiple Vitamin (MULTI-VITAMINS) TABS Take 1 tablet by mouth daily.    Historical Provider, MD  oxyCODONE-acetaminophen (PERCOCET/ROXICET) 5-325 MG tablet Take 1 tablet by mouth 2 (two) times daily. And BID prn    Historical Provider, MD  polyethylene glycol (MIRALAX / GLYCOLAX) packet Take 17 g by mouth daily.    Historical Provider, MD  senna (SENOKOT) 8.6 MG tablet Take 2 tablets by mouth 2 (two) times daily.     Historical Provider, MD  vitamin B-12 (CYANOCOBALAMIN) 1000 MCG tablet Take 1,000 mcg by mouth daily.    Historical Provider, MD  Vitamin D, Cholecalciferol, 1000 units CAPS Take 1 capsule by mouth daily.    Historical Provider, MD    Allergies Morphine and related  Family History  Problem Relation Age of Onset  . Hypertension Other     Social History Social History  Substance Use Topics  . Smoking status: Never Smoker  . Smokeless tobacco: Never Used  . Alcohol use No    Review of Systems  Constitutional: Negative for fever. Cardiovascular:  Negative for chest pain. Respiratory: Negative for shortness of breath. Gastrointestinal: Negative for abdominal pain, vomiting and diarrhea. Musculoskeletal: Positive for left hip pain, left foot pain.  Skin: Positive for left temple laceration. Ecchymosis over left foot.  Neurological: Negative for headaches, focal weakness or numbness.  10-point ROS otherwise negative.  ____________________________________________   PHYSICAL EXAM:  VITAL SIGNS: ED Triage Vitals  Enc Vitals Group     BP 09/06/16 2232 (!) 181/73     Pulse Rate 09/06/16 2232 (!) 102     Resp 09/06/16 2232 18      Temp 09/06/16 2232 98 F (36.7 C)     Temp Source 09/06/16 2232 Oral     SpO2 09/06/16 2232 92 %     Weight 09/06/16 2227 134 lb (60.8 kg)     Height 09/06/16 2227 5\' 4"  (1.626 m)     Head Circumference --      Peak Flow --      Pain Score 09/06/16 2227 8   Constitutional: Alert and oriented. Well appearing and in no distress. Eyes: Conjunctivae are normal. Normal extraocular movements. ENT   Head: Normocephalic and atraumatic.   Nose: No congestion/rhinnorhea.   Mouth/Throat: Mucous membranes are moist.   Neck: No stridor. Hematological/Lymphatic/Immunilogical: No cervical lymphadenopathy. Cardiovascular: Normal rate, regular rhythm.  No murmurs, rubs, or gallops.  Respiratory: Normal respiratory effort without tachypnea nor retractions. Breath sounds are clear and equal bilaterally. No wheezes/rales/rhonchi. Gastrointestinal: Soft and non tender. No rebound. No guarding.  Genitourinary: Deferred Musculoskeletal: Left hip pain with palpation and manipulation. Left leg externally rotated. Also some tenderness over left foot.  Neurologic:  Normal speech and language. No gross focal neurologic deficits are appreciated.  Skin:  Superficial laceration to left temple.  Psychiatric: Mood and affect are normal. Speech and behavior are normal. Patient exhibits appropriate insight and judgment.  ____________________________________________    LABS (pertinent positives/negatives)  Pending  ____________________________________________   EKG  None  ____________________________________________    RADIOLOGY  Left hip/left foot/ct head pending   ____________________________________________   PROCEDURES  Procedures  ____________________________________________   INITIAL IMPRESSION / ASSESSMENT AND PLAN / ED COURSE  Pertinent labs & imaging results that were available during my care of the patient were reviewed by me and considered in my medical decision  making (see chart for details).  Patient presented to the emergency department today after a fall. On exam of most concern for left hip fracture given tenderness to palpation and manipulation. Will have her also obtain a left foot x-ray and head CT.  ____________________________________________   FINAL CLINICAL IMPRESSION(S) / ED DIAGNOSES  Final diagnoses:  Fall, initial encounter  Left leg pain  Closed fracture of neck of left femur, initial encounter [facility])     Note: This dictation was prepared with Dragon dictation. Any transcriptional errors that result from this process are unintentional     Nance Pear, MD 09/07/16 1541

## 2016-09-07 ENCOUNTER — Inpatient Hospital Stay: Payer: Medicare Other

## 2016-09-07 ENCOUNTER — Inpatient Hospital Stay: Payer: Medicare Other | Admitting: Anesthesiology

## 2016-09-07 ENCOUNTER — Encounter
Admission: EM | Disposition: A | Discharge status: Skilled Nursing Facility | Payer: Self-pay | Source: Home / Self Care | Attending: Specialist

## 2016-09-07 DIAGNOSIS — Z8673 Personal history of transient ischemic attack (TIA), and cerebral infarction without residual deficits: Secondary | ICD-10-CM | POA: Diagnosis not present

## 2016-09-07 DIAGNOSIS — S79912A Unspecified injury of left hip, initial encounter: Secondary | ICD-10-CM | POA: Diagnosis not present

## 2016-09-07 DIAGNOSIS — Y92092 Bedroom in other non-institutional residence as the place of occurrence of the external cause: Secondary | ICD-10-CM | POA: Diagnosis not present

## 2016-09-07 DIAGNOSIS — S72042A Displaced fracture of base of neck of left femur, initial encounter for closed fracture: Secondary | ICD-10-CM | POA: Diagnosis present

## 2016-09-07 DIAGNOSIS — M25562 Pain in left knee: Secondary | ICD-10-CM | POA: Diagnosis not present

## 2016-09-07 DIAGNOSIS — Z743 Need for continuous supervision: Secondary | ICD-10-CM | POA: Diagnosis not present

## 2016-09-07 DIAGNOSIS — Z79891 Long term (current) use of opiate analgesic: Secondary | ICD-10-CM | POA: Diagnosis not present

## 2016-09-07 DIAGNOSIS — D62 Acute posthemorrhagic anemia: Secondary | ICD-10-CM | POA: Diagnosis not present

## 2016-09-07 DIAGNOSIS — Z96642 Presence of left artificial hip joint: Secondary | ICD-10-CM | POA: Diagnosis not present

## 2016-09-07 DIAGNOSIS — K219 Gastro-esophageal reflux disease without esophagitis: Secondary | ICD-10-CM | POA: Diagnosis present

## 2016-09-07 DIAGNOSIS — S72002A Fracture of unspecified part of neck of left femur, initial encounter for closed fracture: Secondary | ICD-10-CM | POA: Diagnosis present

## 2016-09-07 DIAGNOSIS — S8992XA Unspecified injury of left lower leg, initial encounter: Secondary | ICD-10-CM | POA: Diagnosis not present

## 2016-09-07 DIAGNOSIS — S72032A Displaced midcervical fracture of left femur, initial encounter for closed fracture: Secondary | ICD-10-CM | POA: Diagnosis not present

## 2016-09-07 DIAGNOSIS — M810 Age-related osteoporosis without current pathological fracture: Secondary | ICD-10-CM | POA: Diagnosis present

## 2016-09-07 DIAGNOSIS — M79672 Pain in left foot: Secondary | ICD-10-CM | POA: Diagnosis not present

## 2016-09-07 DIAGNOSIS — Z90710 Acquired absence of both cervix and uterus: Secondary | ICD-10-CM | POA: Diagnosis not present

## 2016-09-07 DIAGNOSIS — N183 Chronic kidney disease, stage 3 (moderate): Secondary | ICD-10-CM | POA: Diagnosis present

## 2016-09-07 DIAGNOSIS — Z471 Aftercare following joint replacement surgery: Secondary | ICD-10-CM | POA: Diagnosis not present

## 2016-09-07 DIAGNOSIS — I10 Essential (primary) hypertension: Secondary | ICD-10-CM | POA: Diagnosis not present

## 2016-09-07 DIAGNOSIS — S72009A Fracture of unspecified part of neck of unspecified femur, initial encounter for closed fracture: Secondary | ICD-10-CM | POA: Diagnosis not present

## 2016-09-07 DIAGNOSIS — Z7982 Long term (current) use of aspirin: Secondary | ICD-10-CM | POA: Diagnosis not present

## 2016-09-07 DIAGNOSIS — I129 Hypertensive chronic kidney disease with stage 1 through stage 4 chronic kidney disease, or unspecified chronic kidney disease: Secondary | ICD-10-CM | POA: Diagnosis present

## 2016-09-07 DIAGNOSIS — E785 Hyperlipidemia, unspecified: Secondary | ICD-10-CM | POA: Diagnosis present

## 2016-09-07 DIAGNOSIS — Z885 Allergy status to narcotic agent status: Secondary | ICD-10-CM | POA: Diagnosis not present

## 2016-09-07 DIAGNOSIS — M6281 Muscle weakness (generalized): Secondary | ICD-10-CM | POA: Diagnosis not present

## 2016-09-07 DIAGNOSIS — D509 Iron deficiency anemia, unspecified: Secondary | ICD-10-CM | POA: Diagnosis not present

## 2016-09-07 DIAGNOSIS — W010XXA Fall on same level from slipping, tripping and stumbling without subsequent striking against object, initial encounter: Secondary | ICD-10-CM | POA: Diagnosis present

## 2016-09-07 DIAGNOSIS — S728X2A Other fracture of left femur, initial encounter for closed fracture: Secondary | ICD-10-CM | POA: Diagnosis not present

## 2016-09-07 DIAGNOSIS — Z66 Do not resuscitate: Secondary | ICD-10-CM | POA: Diagnosis present

## 2016-09-07 DIAGNOSIS — Y92009 Unspecified place in unspecified non-institutional (private) residence as the place of occurrence of the external cause: Secondary | ICD-10-CM | POA: Diagnosis not present

## 2016-09-07 DIAGNOSIS — S9032XA Contusion of left foot, initial encounter: Secondary | ICD-10-CM | POA: Diagnosis not present

## 2016-09-07 DIAGNOSIS — R2689 Other abnormalities of gait and mobility: Secondary | ICD-10-CM | POA: Diagnosis not present

## 2016-09-07 DIAGNOSIS — M79605 Pain in left leg: Secondary | ICD-10-CM | POA: Diagnosis not present

## 2016-09-07 DIAGNOSIS — J45909 Unspecified asthma, uncomplicated: Secondary | ICD-10-CM | POA: Diagnosis present

## 2016-09-07 DIAGNOSIS — M7989 Other specified soft tissue disorders: Secondary | ICD-10-CM | POA: Diagnosis not present

## 2016-09-07 DIAGNOSIS — M25559 Pain in unspecified hip: Secondary | ICD-10-CM | POA: Diagnosis not present

## 2016-09-07 DIAGNOSIS — S72012A Unspecified intracapsular fracture of left femur, initial encounter for closed fracture: Secondary | ICD-10-CM | POA: Diagnosis not present

## 2016-09-07 DIAGNOSIS — Z8249 Family history of ischemic heart disease and other diseases of the circulatory system: Secondary | ICD-10-CM | POA: Diagnosis not present

## 2016-09-07 HISTORY — PX: HIP ARTHROPLASTY: SHX981

## 2016-09-07 LAB — CBC WITH DIFFERENTIAL/PLATELET
BASOS PCT: 0 %
Basophils Absolute: 0 10*3/uL (ref 0–0.1)
Eosinophils Absolute: 0 10*3/uL (ref 0–0.7)
Eosinophils Relative: 0 %
HEMATOCRIT: 35.8 % (ref 35.0–47.0)
HEMOGLOBIN: 11.5 g/dL — AB (ref 12.0–16.0)
Lymphocytes Relative: 4 %
Lymphs Abs: 0.7 10*3/uL — ABNORMAL LOW (ref 1.0–3.6)
MCH: 31.5 pg (ref 26.0–34.0)
MCHC: 32.1 g/dL (ref 32.0–36.0)
MCV: 98.3 fL (ref 80.0–100.0)
MONOS PCT: 7 %
Monocytes Absolute: 1 10*3/uL — ABNORMAL HIGH (ref 0.2–0.9)
NEUTROS ABS: 13.5 10*3/uL — AB (ref 1.4–6.5)
NEUTROS PCT: 89 %
Platelets: 292 10*3/uL (ref 150–440)
RBC: 3.64 MIL/uL — AB (ref 3.80–5.20)
RDW: 13.8 % (ref 11.5–14.5)
WBC: 15.3 10*3/uL — ABNORMAL HIGH (ref 3.6–11.0)

## 2016-09-07 LAB — COMPREHENSIVE METABOLIC PANEL
ALBUMIN: 3.8 g/dL (ref 3.5–5.0)
ALK PHOS: 74 U/L (ref 38–126)
ALT: 17 U/L (ref 14–54)
ANION GAP: 9 (ref 5–15)
AST: 22 U/L (ref 15–41)
BILIRUBIN TOTAL: 0.8 mg/dL (ref 0.3–1.2)
BUN: 22 mg/dL — AB (ref 6–20)
CALCIUM: 10 mg/dL (ref 8.9–10.3)
CO2: 20 mmol/L — ABNORMAL LOW (ref 22–32)
CREATININE: 1.16 mg/dL — AB (ref 0.44–1.00)
Chloride: 107 mmol/L (ref 101–111)
GFR calc Af Amer: 45 mL/min — ABNORMAL LOW (ref >60)
GFR calc non Af Amer: 39 mL/min — ABNORMAL LOW (ref >60)
GLUCOSE: 125 mg/dL — AB (ref 65–99)
Potassium: 4.1 mmol/L (ref 3.5–5.1)
Sodium: 136 mmol/L (ref 135–145)
TOTAL PROTEIN: 6.9 g/dL (ref 6.5–8.1)

## 2016-09-07 LAB — BASIC METABOLIC PANEL
Anion gap: 4 — ABNORMAL LOW (ref 5–15)
BUN: 19 mg/dL (ref 6–20)
CHLORIDE: 108 mmol/L (ref 101–111)
CO2: 25 mmol/L (ref 22–32)
CREATININE: 1.06 mg/dL — AB (ref 0.44–1.00)
Calcium: 10 mg/dL (ref 8.9–10.3)
GFR calc Af Amer: 50 mL/min — ABNORMAL LOW (ref >60)
GFR calc non Af Amer: 44 mL/min — ABNORMAL LOW (ref >60)
Glucose, Bld: 133 mg/dL — ABNORMAL HIGH (ref 65–99)
POTASSIUM: 3.9 mmol/L (ref 3.5–5.1)
SODIUM: 137 mmol/L (ref 135–145)

## 2016-09-07 LAB — APTT: APTT: 28 s (ref 24–36)

## 2016-09-07 LAB — CBC
HEMATOCRIT: 34 % — AB (ref 35.0–47.0)
HEMOGLOBIN: 11.4 g/dL — AB (ref 12.0–16.0)
MCH: 33.3 pg (ref 26.0–34.0)
MCHC: 33.6 g/dL (ref 32.0–36.0)
MCV: 99 fL (ref 80.0–100.0)
Platelets: 272 10*3/uL (ref 150–440)
RBC: 3.43 MIL/uL — ABNORMAL LOW (ref 3.80–5.20)
RDW: 13.4 % (ref 11.5–14.5)
WBC: 12.9 10*3/uL — AB (ref 3.6–11.0)

## 2016-09-07 LAB — TROPONIN I

## 2016-09-07 LAB — PROTIME-INR
INR: 0.87
PROTHROMBIN TIME: 11.8 s (ref 11.4–15.2)

## 2016-09-07 LAB — TYPE AND SCREEN
ABO/RH(D): O POS
ANTIBODY SCREEN: NEGATIVE

## 2016-09-07 LAB — SURGICAL PCR SCREEN
MRSA, PCR: NEGATIVE
Staphylococcus aureus: NEGATIVE

## 2016-09-07 SURGERY — HEMIARTHROPLASTY, HIP, DIRECT ANTERIOR APPROACH, FOR FRACTURE
Anesthesia: Spinal | Site: Hip | Laterality: Left | Wound class: Clean

## 2016-09-07 MED ORDER — PROPOFOL 500 MG/50ML IV EMUL
INTRAVENOUS | Status: DC | PRN
  Administered 2016-09-07: 25 ug/kg/min via INTRAVENOUS

## 2016-09-07 MED ORDER — ACETAMINOPHEN 10 MG/ML IV SOLN
INTRAVENOUS | Status: DC | PRN
  Administered 2016-09-07: 1000 mg via INTRAVENOUS

## 2016-09-07 MED ORDER — MORPHINE SULFATE (PF) 2 MG/ML IV SOLN
0.5000 mg | INTRAVENOUS | Status: DC | PRN
Start: 2016-09-07 — End: 2016-09-07

## 2016-09-07 MED ORDER — CEFAZOLIN SODIUM-DEXTROSE 2-4 GM/100ML-% IV SOLN
2.0000 g | INTRAVENOUS | Status: AC
  Administered 2016-09-07: 2 g via INTRAVENOUS

## 2016-09-07 MED ORDER — ONDANSETRON HCL 4 MG/2ML IJ SOLN: 4.0000 mg | Freq: Four times a day (QID) | INTRAMUSCULAR | Status: DC | PRN

## 2016-09-07 MED ORDER — ONDANSETRON HCL 4 MG/2ML IJ SOLN: 4.0000 mg | Freq: Once | INTRAMUSCULAR | Status: DC | PRN

## 2016-09-07 MED ORDER — PROPOFOL 10 MG/ML IV BOLUS
INTRAVENOUS | Status: DC | PRN
  Administered 2016-09-07: 10 mg via INTRAVENOUS

## 2016-09-07 MED ORDER — FENTANYL CITRATE (PF) 100 MCG/2ML IJ SOLN
INTRAMUSCULAR | Status: AC
  Administered 2016-09-07: 25 ug via INTRAVENOUS
  Filled 2016-09-07: qty 2

## 2016-09-07 MED ORDER — MELATONIN 3 MG PO TABS: 1.0000 | ORAL_TABLET | Freq: Every day | ORAL | Status: DC

## 2016-09-07 MED ORDER — ACETAMINOPHEN 650 MG RE SUPP: 650.0000 mg | Freq: Four times a day (QID) | RECTAL | Status: DC | PRN

## 2016-09-07 MED ORDER — KETAMINE HCL-SODIUM CHLORIDE 100-0.9 MG/10ML-% IV SOSY
PREFILLED_SYRINGE | INTRAVENOUS | Status: AC
  Filled 2016-09-07: qty 10

## 2016-09-07 MED ORDER — METOPROLOL SUCCINATE ER 50 MG PO TB24
50.0000 mg | ORAL_TABLET | Freq: Every day | ORAL | Status: DC
  Administered 2016-09-07 – 2016-09-10 (×4): 50 mg via ORAL
  Filled 2016-09-07 (×4): qty 1

## 2016-09-07 MED ORDER — SODIUM CHLORIDE 0.9 % IV SOLN
75.0000 mL/h | INTRAVENOUS | Status: DC
  Administered 2016-09-07: 75 mL/h via INTRAVENOUS

## 2016-09-07 MED ORDER — LACTATED RINGERS IV SOLN
INTRAVENOUS | Status: DC
Start: 2016-09-07 — End: 2016-09-07
  Administered 2016-09-07: 13:00:00 via INTRAVENOUS

## 2016-09-07 MED ORDER — PROPOFOL 10 MG/ML IV BOLUS
INTRAVENOUS | Status: AC
  Filled 2016-09-07: qty 20

## 2016-09-07 MED ORDER — SENNA 8.6 MG PO TABS
2.0000 | ORAL_TABLET | Freq: Two times a day (BID) | ORAL | Status: DC
  Administered 2016-09-07 – 2016-09-10 (×7): 17.2 mg via ORAL
  Filled 2016-09-07 (×7): qty 2

## 2016-09-07 MED ORDER — ACETAMINOPHEN 325 MG PO TABS
650.0000 mg | ORAL_TABLET | Freq: Four times a day (QID) | ORAL | Status: DC | PRN
  Administered 2016-09-09 – 2016-09-10 (×5): 650 mg via ORAL
  Filled 2016-09-07 (×6): qty 2

## 2016-09-07 MED ORDER — ONDANSETRON HCL 4 MG PO TABS: 4.0000 mg | ORAL_TABLET | Freq: Four times a day (QID) | ORAL | Status: DC | PRN

## 2016-09-07 MED ORDER — HYDRALAZINE HCL 25 MG PO TABS
25.0000 mg | ORAL_TABLET | Freq: Four times a day (QID) | ORAL | Status: DC
  Administered 2016-09-07 – 2016-09-09 (×10): 25 mg via ORAL
  Filled 2016-09-07 (×11): qty 1

## 2016-09-07 MED ORDER — BISACODYL 10 MG RE SUPP: 10.0000 mg | Freq: Every day | RECTAL | Status: DC | PRN

## 2016-09-07 MED ORDER — VITAMIN B-12 1000 MCG PO TABS
1000.0000 ug | ORAL_TABLET | Freq: Every day | ORAL | Status: DC
  Administered 2016-09-08 – 2016-09-10 (×3): 1000 ug via ORAL
  Filled 2016-09-07 (×3): qty 1

## 2016-09-07 MED ORDER — EPHEDRINE SULFATE 50 MG/ML IJ SOLN
INTRAMUSCULAR | Status: DC | PRN
  Administered 2016-09-07: 10 mg via INTRAVENOUS

## 2016-09-07 MED ORDER — ADULT MULTIVITAMIN W/MINERALS CH
1.0000 | ORAL_TABLET | Freq: Every day | ORAL | Status: DC
  Administered 2016-09-08 – 2016-09-10 (×3): 1 via ORAL
  Filled 2016-09-07 (×3): qty 1

## 2016-09-07 MED ORDER — SODIUM CHLORIDE 0.9 % IV SOLN
INTRAVENOUS | Status: DC
  Administered 2016-09-07: 04:00:00 via INTRAVENOUS

## 2016-09-07 MED ORDER — DOCUSATE SODIUM 100 MG PO CAPS
100.0000 mg | ORAL_CAPSULE | Freq: Two times a day (BID) | ORAL | Status: DC
  Administered 2016-09-07 – 2016-09-10 (×6): 100 mg via ORAL
  Filled 2016-09-07 (×6): qty 1

## 2016-09-07 MED ORDER — ONDANSETRON HCL 4 MG/2ML IJ SOLN
INTRAMUSCULAR | Status: AC
  Filled 2016-09-07: qty 2

## 2016-09-07 MED ORDER — TETRACAINE HCL 1 % IJ SOLN
INTRAMUSCULAR | Status: DC | PRN
  Administered 2016-09-07: 4 mg via INTRASPINAL

## 2016-09-07 MED ORDER — HYDROMORPHONE HCL 1 MG/ML IJ SOLN
0.5000 mg | INTRAMUSCULAR | Status: DC | PRN
  Administered 2016-09-07 (×2): 0.5 mg via INTRAVENOUS
  Filled 2016-09-07 (×2): qty 1

## 2016-09-07 MED ORDER — MAGNESIUM CITRATE PO SOLN
1.0000 | Freq: Once | ORAL | Status: DC | PRN
  Filled 2016-09-07 (×3): qty 296

## 2016-09-07 MED ORDER — ENOXAPARIN SODIUM 40 MG/0.4ML ~~LOC~~ SOLN: 40.0000 mg | SUBCUTANEOUS | Status: DC

## 2016-09-07 MED ORDER — CEFAZOLIN SODIUM-DEXTROSE 2-4 GM/100ML-% IV SOLN
INTRAVENOUS | Status: AC
  Filled 2016-09-07: qty 100

## 2016-09-07 MED ORDER — KETAMINE HCL 10 MG/ML IJ SOLN
INTRAMUSCULAR | Status: DC | PRN
  Administered 2016-09-07 (×4): 10 mg via INTRAVENOUS

## 2016-09-07 MED ORDER — CEFAZOLIN IN D5W 1 GM/50ML IV SOLN
1.0000 g | Freq: Four times a day (QID) | INTRAVENOUS | Status: AC
  Administered 2016-09-08: 1 g via INTRAVENOUS
  Filled 2016-09-07 (×2): qty 50

## 2016-09-07 MED ORDER — NEOMYCIN-POLYMYXIN B GU 40-200000 IR SOLN
Status: DC | PRN
  Administered 2016-09-07: 16 mL

## 2016-09-07 MED ORDER — POLYETHYLENE GLYCOL 3350 17 G PO PACK
17.0000 g | PACK | Freq: Every day | ORAL | Status: DC
  Administered 2016-09-08 – 2016-09-10 (×3): 17 g via ORAL
  Filled 2016-09-07 (×3): qty 1

## 2016-09-07 MED ORDER — OXYCODONE HCL 5 MG PO TABS
5.0000 mg | ORAL_TABLET | ORAL | Status: DC | PRN
  Administered 2016-09-07: 10 mg via ORAL
  Administered 2016-09-07: 5 mg via ORAL
  Administered 2016-09-08 (×3): 10 mg via ORAL
  Administered 2016-09-08: 5 mg via ORAL
  Administered 2016-09-08: 10 mg via ORAL
  Administered 2016-09-09: 5 mg via ORAL
  Filled 2016-09-07 (×2): qty 2
  Filled 2016-09-07: qty 1
  Filled 2016-09-07 (×2): qty 2
  Filled 2016-09-07 (×2): qty 1
  Filled 2016-09-07: qty 2

## 2016-09-07 MED ORDER — ACETAMINOPHEN 10 MG/ML IV SOLN
INTRAVENOUS | Status: AC
  Filled 2016-09-07: qty 100

## 2016-09-07 MED ORDER — AMLODIPINE BESYLATE 10 MG PO TABS
10.0000 mg | ORAL_TABLET | Freq: Every day | ORAL | Status: DC
  Administered 2016-09-07 – 2016-09-10 (×4): 10 mg via ORAL
  Filled 2016-09-07 (×4): qty 1

## 2016-09-07 MED ORDER — MELATONIN 5 MG PO TABS
2.5000 mg | ORAL_TABLET | Freq: Every day | ORAL | Status: DC
  Administered 2016-09-07 – 2016-09-08 (×2): 2.5 mg via ORAL
  Filled 2016-09-07 (×4): qty 0.5

## 2016-09-07 MED ORDER — FENTANYL CITRATE (PF) 100 MCG/2ML IJ SOLN
25.0000 ug | INTRAMUSCULAR | Status: DC | PRN
  Administered 2016-09-07 (×4): 25 ug via INTRAVENOUS

## 2016-09-07 MED ORDER — METHOCARBAMOL 500 MG PO TABS
500.0000 mg | ORAL_TABLET | Freq: Four times a day (QID) | ORAL | Status: DC | PRN
  Administered 2016-09-08: 500 mg via ORAL
  Filled 2016-09-07: qty 1

## 2016-09-07 MED ORDER — GLYCOPYRROLATE 0.2 MG/ML IJ SOLN
INTRAMUSCULAR | Status: AC
  Filled 2016-09-07: qty 1

## 2016-09-07 MED ORDER — POLYETHYLENE GLYCOL 3350 17 G PO PACK: 17.0000 g | PACK | Freq: Every day | ORAL | Status: DC | PRN

## 2016-09-07 MED ORDER — ENOXAPARIN SODIUM 30 MG/0.3ML ~~LOC~~ SOLN
30.0000 mg | SUBCUTANEOUS | Status: DC
  Administered 2016-09-08 – 2016-09-10 (×3): 30 mg via SUBCUTANEOUS
  Filled 2016-09-07 (×3): qty 0.3

## 2016-09-07 MED ORDER — PROPOFOL 500 MG/50ML IV EMUL
INTRAVENOUS | Status: AC
  Filled 2016-09-07: qty 50

## 2016-09-07 MED ORDER — EPHEDRINE SULFATE 50 MG/ML IJ SOLN
INTRAMUSCULAR | Status: AC
  Filled 2016-09-07: qty 1

## 2016-09-07 MED ORDER — HEPARIN SODIUM (PORCINE) 5000 UNIT/ML IJ SOLN: 5000.0000 [IU] | Freq: Three times a day (TID) | INTRAMUSCULAR | Status: DC

## 2016-09-07 MED ORDER — METHOCARBAMOL 1000 MG/10ML IJ SOLN
500.0000 mg | Freq: Four times a day (QID) | INTRAVENOUS | Status: DC | PRN
  Filled 2016-09-07: qty 5

## 2016-09-07 MED ORDER — VITAMIN D 1000 UNITS PO TABS
1000.0000 [IU] | ORAL_TABLET | Freq: Every day | ORAL | Status: DC
  Administered 2016-09-08 – 2016-09-10 (×3): 1000 [IU] via ORAL
  Filled 2016-09-07 (×3): qty 1

## 2016-09-07 MED ORDER — BUPIVACAINE HCL (PF) 0.5 % IJ SOLN
INTRAMUSCULAR | Status: DC | PRN
  Administered 2016-09-07: 2 mL

## 2016-09-07 MED ORDER — PHENYLEPHRINE HCL 10 MG/ML IJ SOLN
INTRAMUSCULAR | Status: DC | PRN
  Administered 2016-09-07 (×2): 100 ug via INTRAVENOUS

## 2016-09-07 MED ORDER — LOSARTAN POTASSIUM 50 MG PO TABS
50.0000 mg | ORAL_TABLET | Freq: Every day | ORAL | Status: DC
  Administered 2016-09-07 – 2016-09-10 (×4): 50 mg via ORAL
  Filled 2016-09-07 (×4): qty 1

## 2016-09-07 MED ORDER — OXYCODONE-ACETAMINOPHEN 5-325 MG PO TABS
1.0000 | ORAL_TABLET | Freq: Four times a day (QID) | ORAL | Status: DC | PRN
  Administered 2016-09-07: 1 via ORAL
  Filled 2016-09-07 (×2): qty 1

## 2016-09-07 MED ORDER — ATORVASTATIN CALCIUM 20 MG PO TABS
80.0000 mg | ORAL_TABLET | Freq: Every day | ORAL | Status: DC
  Administered 2016-09-07 – 2016-09-09 (×3): 80 mg via ORAL
  Filled 2016-09-07 (×3): qty 4

## 2016-09-07 SURGICAL SUPPLY — 53 items
BLADE SAGITTAL WIDE XTHICK NO (BLADE) ×2 IMPLANT
BLADE SURG SZ10 CARB STEEL (BLADE) ×2 IMPLANT
BNDG COHESIVE 4X5 TAN STRL (GAUZE/BANDAGES/DRESSINGS) ×2 IMPLANT
CABLE SET DALL-MILES (Hips) IMPLANT
CANISTER SUCT 1200ML W/VALVE (MISCELLANEOUS) ×2 IMPLANT
CANISTER SUCT 3000ML PPV (MISCELLANEOUS) ×4 IMPLANT
CAPT HIP HEMI 2 ×2 IMPLANT
DRAPE IMP U-DRAPE 54X76 (DRAPES) ×2 IMPLANT
DRAPE INCISE IOBAN 66X60 STRL (DRAPES) ×2 IMPLANT
DRAPE SHEET LG 3/4 BI-LAMINATE (DRAPES) ×4 IMPLANT
DRAPE SURG 17X11 SM STRL (DRAPES) ×2 IMPLANT
DRAPE TABLE BACK 80X90 (DRAPES) ×2 IMPLANT
DRSG OPSITE POSTOP 4X10 (GAUZE/BANDAGES/DRESSINGS) ×2 IMPLANT
DURAPREP 26ML APPLICATOR (WOUND CARE) ×8 IMPLANT
ELECT BLADE 6.5 EXT (BLADE) ×2 IMPLANT
ELECT CAUTERY BLADE 6.4 (BLADE) ×2 IMPLANT
ELECT REM PT RETURN 9FT ADLT (ELECTROSURGICAL) ×2
ELECTRODE REM PT RTRN 9FT ADLT (ELECTROSURGICAL) ×1 IMPLANT
GAUZE PETRO XEROFOAM 1X8 (MISCELLANEOUS) ×4 IMPLANT
GAUZE SPONGE 4X4 12PLY STRL (GAUZE/BANDAGES/DRESSINGS) ×2 IMPLANT
GLOVE BIOGEL PI IND STRL 9 (GLOVE) ×1 IMPLANT
GLOVE BIOGEL PI INDICATOR 9 (GLOVE) ×1
GLOVE SURG 9.0 ORTHO LTXF (GLOVE) ×4 IMPLANT
GOWN STRL REUS TWL 2XL XL LVL4 (GOWN DISPOSABLE) ×2 IMPLANT
GOWN STRL REUS W/ TWL LRG LVL3 (GOWN DISPOSABLE) ×1 IMPLANT
GOWN STRL REUS W/TWL LRG LVL3 (GOWN DISPOSABLE) ×1
HEMOVAC 400ML (MISCELLANEOUS) ×2
HIP CAPITATED HEMI 2 ×1 IMPLANT
KIT DRAIN HEMOVAC JP 7FR 400ML (MISCELLANEOUS) ×1 IMPLANT
KIT RM TURNOVER STRD PROC AR (KITS) ×2 IMPLANT
NDL SAFETY 18GX1.5 (NEEDLE) ×2 IMPLANT
NEEDLE FILTER BLUNT 18X 1/2SAF (NEEDLE) ×1
NEEDLE FILTER BLUNT 18X1 1/2 (NEEDLE) ×1 IMPLANT
NEEDLE MAYO CATGUT SZ4 (NEEDLE) ×2 IMPLANT
NS IRRIG 1000ML POUR BTL (IV SOLUTION) ×2 IMPLANT
PACK HIP PROSTHESIS (MISCELLANEOUS) ×2 IMPLANT
PILLOW ABDUC SM (MISCELLANEOUS) ×2 IMPLANT
PULSAVAC PLUS IRRIG FAN TIP (DISPOSABLE) ×2
RETRIEVER SUT HEWSON (MISCELLANEOUS) ×2 IMPLANT
SLEEVE CABLE 2MM VT (Orthopedic Implant) ×2 IMPLANT
SOL .9 NS 3000ML IRR  AL (IV SOLUTION) ×1
SOL .9 NS 3000ML IRR UROMATIC (IV SOLUTION) ×1 IMPLANT
STAPLER SKIN PROX 35W (STAPLE) ×2 IMPLANT
SUT MNCRL 3 0 RB1 (SUTURE) ×1 IMPLANT
SUT MONOCRYL 3 0 RB1 (SUTURE) ×1
SUT TICRON 2-0 30IN 311381 (SUTURE) ×8 IMPLANT
SUT VIC AB 0 CT1 36 (SUTURE) ×2 IMPLANT
SUT VIC AB 2-0 CT2 27 (SUTURE) ×4 IMPLANT
SYRINGE 10CC LL (SYRINGE) ×2 IMPLANT
TAPE MICROFOAM 4IN (TAPE) ×2 IMPLANT
TAPE TRANSPORE STRL 2 31045 (GAUZE/BANDAGES/DRESSINGS) ×2 IMPLANT
TIP BRUSH PULSAVAC PLUS 24.33 (MISCELLANEOUS) ×2 IMPLANT
TIP FAN IRRIG PULSAVAC PLUS (DISPOSABLE) ×1 IMPLANT

## 2016-09-07 NOTE — Progress Notes (Signed)
Notified Dr. Estanislado Pandy that patient is NPO and does not have any fluids ordered. Order received for normal saline at 60 ml/hr

## 2016-09-07 NOTE — Progress Notes (Signed)
Pt transferred to the OR for surgery. AAOx4. Foley in place. IV patent. Consents signed.

## 2016-09-07 NOTE — NC FL2 (Signed)
[place] LEVEL OF CARE SCREENING TOOL     IDENTIFICATION  Patient Name: Jacqueline Sanchez Birthdate: 1920/05/12 Sex: female Admission Date (Current Location): 09/06/2016  [place] and [place] Number:  Engineering geologist and Address:  [facility], [address] [place], [place] [address]      Provider Number: [id number]  Attending Physician Name and Address:  Vaughan Basta, MD  Relative Name and Phone Number:       Current Level of Care: Hospital Recommended Level of Care: [place] Prior Approval Number:    Date Approved/Denied:   PASRR Number:  [id number])  Discharge Plan: SNF    Current Diagnoses: Patient Active Problem List   Diagnosis Date Noted  . Fracture of femoral neck, left ([place]) 09/07/2016  . Essential hypertension, benign 06/23/2016  . Generalized osteoarthritis of multiple sites 06/23/2016  . Sleep disturbance 06/23/2016  . Aphasia 04/21/2016  . CKD (chronic kidney disease), stage III 04/21/2016    Orientation RESPIRATION BLADDER Height & Weight     Self, Time, Situation, Place  O2 (2 Liters Oxygen ) Incontinent Weight: 139 lb 4.8 oz (63.2 kg) Height:  5\' 4"  (162.6 cm)  BEHAVIORAL SYMPTOMS/MOOD NEUROLOGICAL BOWEL NUTRITION STATUS   (none)  (none) Continent Diet (NPO for surgery )  AMBULATORY STATUS COMMUNICATION OF NEEDS Skin   Extensive Assist Verbally Other (Comment), Surgical wounds (Laceration Left Eye)                       Personal Care Assistance Level of Assistance  Bathing, Feeding, Dressing Bathing Assistance: Limited assistance Feeding assistance: Independent Dressing Assistance: Limited assistance     Functional Limitations Info  Sight, Hearing, Speech Sight Info: Adequate Hearing Info: Adequate Speech Info: Adequate    SPECIAL CARE FACTORS FREQUENCY  PT (By licensed PT), OT (By licensed OT)     PT Frequency:  (5) OT Frequency:  (5)             Contractures      Additional Factors Info  Code Status, Allergies Code Status Info:  (DNR ) Allergies Info:  (Morphine And Related)           Current Medications (09/07/2016):  This is the current hospital active medication list Current Facility-Administered Medications  Medication Dose Route Frequency Provider Last Rate Last Dose  . 0.9 %  sodium chloride infusion   Intravenous Continuous Saundra Shelling, MD 60 mL/hr at 09/07/16 0354    . amLODipine (NORVASC) tablet 10 mg  10 mg Oral Daily Alexis Hugelmeyer, DO   10 mg at 09/07/16 7939  . atorvastatin (LIPITOR) tablet 80 mg  80 mg Oral Daily Alexis Hugelmeyer, DO      . cholecalciferol (VITAMIN D) tablet 1,000 Units  1,000 Units Oral Daily Alexis Hugelmeyer, DO      . heparin injection 5,000 Units  5,000 Units Subcutaneous Q8H Alexis Hugelmeyer, DO      . hydrALAZINE (APRESOLINE) tablet 25 mg  25 mg Oral QID Alexis Hugelmeyer, DO   25 mg at 09/07/16 0300  . HYDROmorphone (DILAUDID) injection 0.5 mg  0.5 mg Intravenous Q4H PRN Saundra Shelling, MD   0.5 mg at 09/07/16 0328  . losartan (COZAAR) tablet 50 mg  50 mg Oral Daily Alexis Hugelmeyer, DO   50 mg at 09/07/16 9233  . Melatonin TABS 2.5 mg  2.5 mg Oral QHS Alexis Hugelmeyer, DO      . methocarbamol (ROBAXIN) tablet 500 mg  500 mg  Oral Q6H PRN [place], MD       Or  . methocarbamol (ROBAXIN) 500 mg in dextrose 5 % 50 mL IVPB  500 mg Intravenous Q6H PRN [place], MD      . metoprolol succinate (TOPROL-XL) 24 hr tablet 50 mg  50 mg Oral Daily Alexis Hugelmeyer, DO   50 mg at 09/07/16 7622  . multivitamin with minerals tablet 1 tablet  1 tablet Oral Daily Alexis Hugelmeyer, DO      . oxyCODONE-acetaminophen (PERCOCET/ROXICET) 5-325 MG per tablet 1 tablet  1 tablet Oral Q6H PRN Saundra Shelling, MD   1 tablet at 09/07/16 0554  . polyethylene glycol (MIRALAX / GLYCOLAX) packet 17 g  17 g Oral Daily Alexis Hugelmeyer, DO      . senna (SENOKOT) tablet 17.2 mg  2 tablet Oral BID  Alexis Hugelmeyer, DO   17.2 mg at 09/07/16 6333  . vitamin B-12 (CYANOCOBALAMIN) tablet 1,000 mcg  1,000 mcg Oral Daily Alexis Hugelmeyer, DO         Discharge Medications: Please see discharge summary for a list of discharge medications.  Relevant Imaging Results:  Relevant Lab Results:   Additional Information  (SSN: [id number])  Sample, Veronia Beets, LCSW

## 2016-09-07 NOTE — Progress Notes (Signed)
Patient informed me that she cannot take morphine. Informed Dr. Estanislado Pandy. Order received to order Dilaudid 0.5 mg IV every 4 hrs as needed for severe pain.

## 2016-09-07 NOTE — ED Notes (Signed)
Patient was 87% room air. Placed on 2 L [place].

## 2016-09-07 NOTE — H&P (Addendum)
History and Physical   [facility] - [place] @ [facility] Admission History and Physical [employer], D.O.    Patient Name: Jacqueline Sanchez MR#: [id number] Date of Birth: 12/05/1919 Date of Admission: 09/06/2016  Referring MD/NP/PA: Dr. Beather Arbour Primary Care Physician: Kirk Ruths., MD Patient coming from: [place]  Chief Complaint:  Chief Complaint  Patient presents with  . Fall  . Leg Pain    HPI: Jacqueline Sanchez is a 83 y.o. female with a known history of Asthma, CKD 3, GERD, hypertension, hyperlipidemia, osteoporosis, TIA presents to the emergency department for evaluation of left hip pain following a mechanical  fall.  Patient was in a usual state of health until  this afternoon when she tripped on her bed linens landing on her left hip and side. She denies any head trauma, loss of consciousness or preceding symptoms such as dizziness, lightheadedness, chest pain, palpitations..  Patient lives independently and has excellent functional capacity  EMS/ED Course: Patient received fentanyl, Zofran.  Review of Systems:  CONSTITUTIONAL: No fever/chills, fatigue, weakness, weight gain/loss, headache. EYES: No blurry or double vision. ENT: No tinnitus, postnasal drip, redness or soreness of the oropharynx. RESPIRATORY: No cough, dyspnea, wheeze.  No hemoptysis.  CARDIOVASCULAR: No chest pain, palpitations, syncope, orthopnea. No lower extremity edema.  GASTROINTESTINAL: No nausea, vomiting, abdominal pain, diarrhea, constipation.  No hematemesis, melena or hematochezia. GENITOURINARY: No dysuria, frequency, hematuria. ENDOCRINE: No polyuria or nocturia. No heat or cold intolerance. HEMATOLOGY: No anemia, bruising, bleeding. INTEGUMENTARY: No rashes, ulcers, lesions. MUSCULOSKELETAL: No arthritis, gout, dyspnea. Left hip pain.  NEUROLOGIC: No numbness, tingling, ataxia, seizure-type activity, weakness. PSYCHIATRIC: No anxiety, depression,  insomnia.   Past Medical History:  Diagnosis Date  . Aphasia 04/2016   Transient---??TIA  . Asthma   . CKD (chronic kidney disease)   . Edema   . GERD (gastroesophageal reflux disease)   . Hyperlipidemia   . Hypertension   . Osteoporosis     Surgical History   Surgery Date Laterality Comments  HYSTERECTOMY     MULTIPLE BENIGN BREAST SURGERIES     CATARACT EXTRACTION  Bilateral   BACK SURGERY 06/02/2011 Left LEFT L5-S1 MICRODISKECTOMY  BACK SURGERY         reports that she has never smoked. She has never used smokeless tobacco. She reports that she does not drink alcohol. Her drug history is not on file.  Allergies  Allergen Reactions  . Morphine And Related Nausea Only    Family History   Medical History Relation Name Comments  Coronary Artery Disease (Blocked arteries around heart) Mother    Myocardial Infarction (Heart attack) Mother       Prior to Admission medications   Medication Sig Start Date End Date Taking? Authorizing Provider  amLODipine (NORVASC) 10 MG tablet Take 1 tablet (10 mg total) by mouth daily. 04/23/16   Fritzi Mandes, MD  aspirin EC 81 MG tablet Take 81 mg by mouth every other day.    Historical Provider, MD  atorvastatin (LIPITOR) 80 MG tablet Take 1 tablet by mouth daily.    Historical Provider, MD  hydrALAZINE (APRESOLINE) 25 MG tablet Take 1 tablet (25 mg total) by mouth 4 (four) times daily. 04/23/16   Fritzi Mandes, MD  losartan (COZAAR) 50 MG tablet Take 1 tablet by mouth daily. 03/08/16   Historical Provider, MD  Melatonin 3 MG TABS Take 1 tablet by mouth at bedtime.    Historical Provider, MD  metoprolol succinate (TOPROL-XL) 50 MG 24 hr tablet  Take 1 tablet (50 mg total) by mouth daily. Take with or immediately following a meal. 04/23/16   Fritzi Mandes, MD  Multiple Vitamin (MULTI-VITAMINS) TABS Take 1 tablet by mouth daily.    Historical Provider, MD  oxyCODONE-acetaminophen (PERCOCET/ROXICET) 5-325 MG tablet Take 1 tablet by mouth  2 (two) times daily. And BID prn    Historical Provider, MD  polyethylene glycol (MIRALAX / GLYCOLAX) packet Take 17 g by mouth daily.    Historical Provider, MD  senna (SENOKOT) 8.6 MG tablet Take 2 tablets by mouth 2 (two) times daily.     Historical Provider, MD  vitamin B-12 (CYANOCOBALAMIN) 1000 MCG tablet Take 1,000 mcg by mouth daily.    Historical Provider, MD  Vitamin D, Cholecalciferol, 1000 units CAPS Take 1 capsule by mouth daily.    Historical Provider, MD    Physical Exam: Vitals:   09/06/16 2227 09/06/16 2232 09/06/16 2355  BP:  (!) 181/73 (!) 164/64  Pulse:  (!) 102 (!) 102  Resp:  18 18  Temp:  98 F (36.7 C)   TempSrc:  Oral   SpO2:  92% 93%  Weight: 60.8 kg (134 lb)    Height: 5\' 4"  (1.626 m)      GENERAL: 83 y.o.-year-old female patient, well-developed, well-nourished lying in the bed in no acute distress.  Pleasant and cooperative.   HEENT: Head atraumatic, normocephalic. Pupils equal, round, reactive to light and accommodation. No scleral icterus. Extraocular muscles intact. Nares are patent. Oropharynx is clear. Mucus membranes moist. NECK: Supple, full range of motion. No JVD, no bruit heard. No thyroid enlargement, no tenderness, no cervical lymphadenopathy. CHEST: Normal breath sounds bilaterally. No wheezing, rales, rhonchi or crackles. No use of accessory muscles of respiration.  No reproducible chest wall tenderness.  CARDIOVASCULAR: S1, S2 normal. No murmurs, rubs, or gallops. Cap refill <2 seconds. Pulses intact distally.  ABDOMEN: Soft, nondistended, nontender. No rebound, guarding, rigidity. Normoactive bowel sounds present in all four quadrants. No organomegaly or mass. EXTREMITIES: Left lef shortened and externally rotated.  Tender to palpation.  No pedal edema, cyanosis, or clubbing. No calf tenderness or Homan's sign.  NEUROLOGIC: The patient is alert and oriented x 3. Cranial nerves II through XII are grossly intact with no focal sensorimotor deficit.  Muscle strength 5/5 in all extremities. Sensation intact. Gait not checked. PSYCHIATRIC:  Normal affect, mood, thought content. SKIN: Warm, dry, and intact without obvious rash, lesion, or ulcer.    Labs on Admission:  CBC:  Recent Labs Lab 09/06/16 2338  WBC 15.3*  NEUTROABS 13.5*  HGB 11.5*  HCT 35.8  MCV 98.3  PLT 694   Basic Metabolic Panel: No results for input(s): NA, K, CL, CO2, GLUCOSE, BUN, CREATININE, CALCIUM, MG, PHOS in the last 168 hours. GFR: CrCl cannot be calculated (Patient's most recent lab result is older than the maximum 21 days allowed.). Liver Function Tests: No results for input(s): AST, ALT, ALKPHOS, BILITOT, PROT, ALBUMIN in the last 168 hours. No results for input(s): LIPASE, AMYLASE in the last 168 hours. No results for input(s): AMMONIA in the last 168 hours. Coagulation Profile: No results for input(s): INR, PROTIME in the last 168 hours. Cardiac Enzymes: No results for input(s): CKTOTAL, CKMB, CKMBINDEX, TROPONINI in the last 168 hours. BNP (last 3 results) No results for input(s): PROBNP in the last 8760 hours. HbA1C: No results for input(s): HGBA1C in the last 72 hours. CBG: No results for input(s): GLUCAP in the last 168 hours. Lipid Profile: No results  for input(s): CHOL, HDL, LDLCALC, TRIG, CHOLHDL, LDLDIRECT in the last 72 hours. Thyroid Function Tests: No results for input(s): TSH, T4TOTAL, FREET4, T3FREE, THYROIDAB in the last 72 hours. Anemia Panel: No results for input(s): VITAMINB12, FOLATE, FERRITIN, TIBC, IRON, RETICCTPCT in the last 72 hours. Urine analysis:    Component Value Date/Time   COLORURINE Amber 05/30/2011 1042   APPEARANCEUR Cloudy 05/30/2011 1042   LABSPEC 1.013 05/30/2011 1042   PHURINE 5.0 05/30/2011 1042   GLUCOSEU Negative 05/30/2011 1042   HGBUR Negative 05/30/2011 1042   BILIRUBINUR Negative 05/30/2011 1042   KETONESUR Negative 05/30/2011 1042   PROTEINUR Negative 05/30/2011 1042   NITRITE Negative  05/30/2011 1042   LEUKOCYTESUR 3+ 05/30/2011 1042   Sepsis Labs: @LABRCNTIP (procalcitonin:4,lacticidven:4) )No results found for this or any previous visit (from the past 240 hour(s)).   Radiological Exams on Admission: Dg Chest 1 View  Result Date: 09/06/2016 CLINICAL DATA:  Fall.  Preoperative. EXAM: CHEST 1 VIEW COMPARISON:  05/30/2011 FINDINGS: Mild cardiac enlargement. Mild emphysematous changes in the lungs. No focal airspace disease or consolidation. Calcified granulomas in the right lung base. No blunting of costophrenic angles. No pneumothorax. Mediastinal contours appear intact. Calcified and tortuous aorta. Old left rib fractures. IMPRESSION: Emphysematous changes in the lungs. Cardiac enlargement. No evidence of active pulmonary disease. Electronically Signed   By: Lucienne Capers M.D.   On: 09/06/2016 23:44   Ct Head Wo Contrast  Result Date: 09/06/2016 CLINICAL DATA:  Mechanical fall today. No loss of consciousness. Laceration to the left eye. EXAM: CT HEAD WITHOUT CONTRAST TECHNIQUE: Contiguous axial images were obtained from the base of the skull through the vertex without intravenous contrast. COMPARISON:  MRI brain 04/20/2016.  CT head 04/20/2016. FINDINGS: Brain: Well defined cystic lesion in the right frontal lobe measuring 3.2 x 3.9 cm in diameter. Mild calcification. This is unchanged since previous study. Mild diffuse cerebral atrophy. Mild ventricular dilatation consistent with central atrophy. No mass effect or midline shift. No abnormal extra-axial fluid collections. Gray-white matter junctions are distinct. Basal cisterns are not effaced. No acute intracranial hemorrhage. Vascular: Vascular calcifications are present. Skull: Calvarium appears intact. Sinuses/Orbits: Paranasal sinuses and mastoid air cells are clear. Other: No significant changes since previous study. IMPRESSION: No acute intracranial abnormalities. Chronic atrophy. Cystic lesion in the right anterior frontal  lobe without change since previous study, likely benign. Electronically Signed   By: Lucienne Capers M.D.   On: 09/06/2016 23:05   Dg Foot Complete Left  Result Date: 09/06/2016 CLINICAL DATA:  Left foot pain and bruising after a fall. EXAM: LEFT FOOT - COMPLETE 3+ VIEW COMPARISON:  None. FINDINGS: Diffuse bone demineralization. No evidence of acute fracture or dislocation. Soft tissue swelling over the dorsum of the foot. No destructive or expansile bone lesions are appreciated. IMPRESSION: Diffuse bone demineralization. Dorsal soft tissue swelling. No acute fractures identified. Electronically Signed   By: Lucienne Capers M.D.   On: 09/06/2016 23:42   Dg Hip Unilat W Or Wo Pelvis 2-3 Views Left  Result Date: 09/06/2016 CLINICAL DATA:  Left hip pain after fall. EXAM: DG HIP (WITH OR WITHOUT PELVIS) 2-3V LEFT COMPARISON:  None. FINDINGS: Diffuse bone demineralization. There is a transverse fracture at the base of the left femoral neck with probable focal extension into the lesser trochanter. There is varus angulation of the hip with impaction of fracture fragments and mild anterior displacement of the distal fracture fragment with posterior rotation of the femoral head. No dislocation at the hip joint. The  pelvis appears intact. Degenerative changes in the lower lumbar spine and both hips. Post kyphoplasty changes in the lower lumbar spine. Vascular calcifications are present. IMPRESSION: Acute transverse fracture of the base of the left femoral neck with probable extension into the lesser trochanteric region. Varus angulation of the fracture fragments. Electronically Signed   By: Lucienne Capers M.D.   On: 09/06/2016 23:41    EKG: Sinus tachycardia at 100 bpm with normal axis, PACs and nonspecific ST-T wave changes.   ECHO: 12/17 Study Conclusions  - Left ventricle: The cavity size was normal. There was mild   concentric hypertrophy. Systolic function was vigorous. The   estimated ejection  fraction was in the range of 65% to 70%. Wall   motion was normal; there were no regional wall motion   abnormalities. Features are consistent with a pseudonormal left   ventricular filling pattern, with concomitant abnormal relaxation   and increased filling pressure (grade 2 diastolic dysfunction). - Aortic valve: There was mild stenosis. There was mild   regurgitation. Mean gradient (S): 14 mm Hg. Valve area (VTI):   2.41 cm^2. Valve area (Vmax): 2.06 cm^2. Valve area (Vmean): 2.14   cm^2. - Mitral valve: Calcified annulus. Severely calcified leaflets .   The findings are consistent with mild stenosis. Mean gradient   (D): 7 mm Hg. Valve area by continuity equation (using LVOT   flow): 2.41 cm^2. - Left atrium: The atrium was mildly dilated. - Pulmonary arteries: Systolic pressure could not be accurately   estimated.  Assessment/Plan  This is a 83 y.o. female with a history of Asthma, CKD 3, GERD, hypertension, hyperlipidemia, osteoporosis, TIA  now being admitted with:  #. Left femoral neck fracture -Admit inpatient -Pain control -Nothing by mouth for OR -Orthopedics with Dr. Mack Guise -hold aspirin  #.  History of hypertension -Continue Norvasc, hydralazine, Cozaar, metoprolol  #.  History of hyperlipidemia -Continue Lipitor  #. History of  TIA - Continue  hold aspirin  #. History of CKD, stable at baseline  Admission status:  Inpatient IV Fluids:  Normal saline  Diet/Nutrition:  Nothing by mouth Consults called:  Ortho  DVT Px: Heparin SCDs and early ambulation. Code Status: DNR, yellow form in chart Disposition Plan: To be determined in 3-4 days  All the records are reviewed and case discussed with ED provider. Management plans discussed with the patient and/or family who express understanding and agree with plan of care.  Quinten Allerton D.O. on 09/07/2016 at 12:44 AM Between 7am to 6pm - Pager - [phone] After 6pm go to [email or web address] - [facility] [place] [facility] Office [phone] CC: Primary care physician; Kirk Ruths., MD   09/07/2016, 12:44 AM

## 2016-09-07 NOTE — Anesthesia Procedure Notes (Signed)
Date/Time: 09/07/2016 1:15 PM Performed by: Darlyne Russian Pre-anesthesia Checklist: Patient identified, Emergency Drugs available, Suction available, Patient being monitored and Timeout performed Oxygen Delivery Method: Nasal cannula Placement Confirmation: positive ETCO2

## 2016-09-07 NOTE — Anesthesia Post-op Follow-up Note (Cosign Needed)
Anesthesia QCDR form completed.        

## 2016-09-07 NOTE — Progress Notes (Signed)
Shift assessment completed at 0920. Pt c/o pain 8/10 and received dilauldid ivp, also received bp medicines per conversation with Dr. Mack Guise.Pt is alert and oriented x4, pt is on o2 at [facility], lungs are clear bilat, Hr is regular, abdomen is soft, bs heard. Foley draining clear yellow urine, ppp, bruising noted to l foot and l hip as well.piv #20 intact to r fa with iv nsin fusing at 33mls/hr, site is free of redness and swelling. Pt has had CT scans completed. Pt has also spoken to her son Ronalee Belts regarding time of surgery after Dr. Mack Guise rounded, and pt stated that no family members are able to be with her at this time. Report given to [place], RN at [phone], care of pt is released.

## 2016-09-07 NOTE — Anesthesia Preprocedure Evaluation (Addendum)
Anesthesia Evaluation  Patient identified by MRN, date of birth, ID band Patient awake and Patient confused    Reviewed: Allergy & Precautions, NPO status , Patient's Chart, lab work & pertinent test results, reviewed documented beta blocker date and time   Airway Mallampati: III  TM Distance: >3 FB     Dental  (+) Chipped, Caps   Pulmonary asthma ,    Pulmonary exam normal        Cardiovascular hypertension, Pt. on medications and Pt. on home beta blockers Normal cardiovascular exam     Neuro/Psych Hx of aphasia TIA   GI/Hepatic Neg liver ROS, GERD  ,  Endo/Other  negative endocrine ROS  Renal/GU Renal InsufficiencyRenal disease  negative genitourinary   Musculoskeletal  (+) Arthritis , Osteoarthritis,    Abdominal Normal abdominal exam  (+)   Peds negative pediatric ROS (+)  Hematology negative hematology ROS (+)   Anesthesia Other Findings   Reproductive/Obstetrics                            Anesthesia Physical Anesthesia Plan  ASA: III  Anesthesia Plan: Spinal   Post-op Pain Management:    Induction: Intravenous  Airway Management Planned: Nasal Cannula  Additional Equipment:   Intra-op Plan:   Post-operative Plan:   Informed Consent: I have reviewed the patients History and Physical, chart, labs and discussed the procedure including the risks, benefits and alternatives for the proposed anesthesia with the patient or authorized representative who has indicated his/her understanding and acceptance.   Dental advisory given  Plan Discussed with: CRNA and Surgeon  Anesthesia Plan Comments:        Anesthesia Quick Evaluation

## 2016-09-07 NOTE — Consult Note (Signed)
I have seen patient.  CT of left hip ordered to further evaluate fracture. Full consult note to follow.

## 2016-09-07 NOTE — Clinical Social Work Note (Signed)
Clinical Social Work Assessment  Patient Details  Name: Jacqueline Sanchez MRN: [id number] Date of Birth: 19-Jan-1920  Date of referral:  09/07/16               Reason for consult:  Facility Placement                Permission sought to share information with:  Chartered certified accountant granted to share information::  Yes, Verbal Permission Granted  Name::      Retail buyer::   [place]   Relationship::     Contact Information:     Housing/Transportation Living arrangements for the past 2 months:  [place] of Information:  Patient Patient Interpreter Needed:  None Criminal Activity/Legal Involvement Pertinent to Current Situation/Hospitalization:  No - Comment as needed Significant Relationships:  Adult Children Lives with:  Facility Resident Do you feel safe going back to the place where you live?  Yes Need for family participation in patient care:  Yes (Comment)  Care giving concerns:  Patient is a long term care resident at [facility].    Social Worker assessment / plan:  Holiday representative ([place]) reviewed chart and noted that patient is from [facility] and will have surgery today. CSW met with patient alone prior to surgery. Patient was alert and oriented X4. CSW introduced self and explained role of CSW department. Patient reported that she lives at [facility] and has 2 adult sons, 17 grandsons and 5 great grandchildren. Patient reported that her spouse is deceased. CSW explained that patient will need to go to [facility] side after surgery. CSW explained that medicare requires a 3 night qualifying inpatient stay at a hospital in order to pay for SNF. Patient was admitted to inpatient on 09/07/16. Patient verbalized her understanding and is agreeable to go to [facility] on the SNF side. Per Seth Bake admissions coordinator at [facility] patient can come to the SNF side on Saturday 09/10/16. CSW will continue to  follow and assist as needed.    Employment status:  Retired Forensic scientist:  Medicare PT Recommendations:  Not assessed at this time [place] / Referral to community resources:  [place] [facility]  Patient/Family's Response to care:  Patient is agreeable to go to [facility].   Patient/Family's Understanding of and Emotional Response to Diagnosis, Current Treatment, and Prognosis:  Patient was very pleasant and thanked CSW for assistance.   Emotional Assessment Appearance:  Appears stated age Attitude/Demeanor/Rapport:    Affect (typically observed):  Accepting, Adaptable, Pleasant Orientation:  Oriented to Self, Oriented to Place, Oriented to  Time, Oriented to Situation Alcohol / Substance use:  Not Applicable Psych involvement (Current and /or in the community):  No (Comment)  Discharge Needs  Concerns to be addressed:  Discharge Planning Concerns Readmission within the last 30 days:  No Current discharge risk:  Dependent with Mobility Barriers to Discharge:  Continued Medical Work up   [employer], Veronia Beets, LCSW 09/07/2016, 3:51 PM

## 2016-09-07 NOTE — Progress Notes (Signed)
Pt returned from surgery AAOx4. Pain controlled 3/10. Began clear liquid diet. vss. Foley in place.

## 2016-09-07 NOTE — Progress Notes (Signed)
[facility] NAME: Jacqueline Sanchez    MR#:  [id number]  DATE OF BIRTH:  Dec 23, 1919  SUBJECTIVE:  CHIEF COMPLAINT:   Chief Complaint  Patient presents with  . Fall  . Leg Pain     Came with a fall, left hip fracture. Denies any complains, fall was accidental.  REVIEW OF SYSTEMS:  CONSTITUTIONAL: No fever, fatigue or weakness.  EYES: No blurred or double vision.  EARS, NOSE, AND THROAT: No tinnitus or ear pain.  RESPIRATORY: No cough, shortness of breath, wheezing or hemoptysis.  CARDIOVASCULAR: No chest pain, orthopnea, edema.  GASTROINTESTINAL: No nausea, vomiting, diarrhea or abdominal pain.  GENITOURINARY: No dysuria, hematuria.  ENDOCRINE: No polyuria, nocturia,  HEMATOLOGY: No anemia, easy bruising or bleeding SKIN: No rash or lesion. MUSCULOSKELETAL: left hip joint pain .   NEUROLOGIC: No tingling, numbness, weakness.  PSYCHIATRY: No anxiety or depression.   ROS  DRUG ALLERGIES:   Allergies  Allergen Reactions  . Morphine And Related Nausea Only    VITALS:  Blood pressure (!) 145/53, pulse 85, temperature 98.5 F (36.9 C), temperature source Oral, resp. rate 18, height 5\' 4"  (1.626 m), weight 63 kg (139 lb), SpO2 100 %.  PHYSICAL EXAMINATION:  GENERAL:  83 y.o.-year-old patient lying in the bed with no acute distress.  EYES: Pupils equal, round, reactive to light and accommodation. No scleral icterus. Extraocular muscles intact.  HEENT: Head atraumatic, normocephalic. Oropharynx and nasopharynx clear.  NECK:  Supple, no jugular venous distention. No thyroid enlargement, no tenderness.  LUNGS: Normal breath sounds bilaterally, no wheezing, rales,rhonchi or crepitation. No use of accessory muscles of respiration.  CARDIOVASCULAR: S1, S2 normal. Systolic murmurs, no rubs, or gallops.  ABDOMEN: Soft, nontender, nondistended. Bowel sounds present. No organomegaly or mass.  EXTREMITIES: No pedal edema, cyanosis, or  clubbing.  NEUROLOGIC: Cranial nerves II through XII are intact. Muscle strength 5/5 in all extremities, except left lower- not moving due to pain. Sensation intact. Gait not checked.  PSYCHIATRIC: The patient is alert and oriented x 3.  SKIN: No obvious rash, lesion, or ulcer.   Physical Exam LABORATORY PANEL:   CBC  Recent Labs Lab 09/07/16 0523  WBC 12.9*  HGB 11.4*  HCT 34.0*  PLT 272   ------------------------------------------------------------------------------------------------------------------  Chemistries   Recent Labs Lab 09/06/16 2338 09/07/16 0523  NA 136 137  K 4.1 3.9  CL 107 108  CO2 20* 25  GLUCOSE 125* 133*  BUN 22* 19  CREATININE 1.16* 1.06*  CALCIUM 10.0 10.0  AST 22  --   ALT 17  --   ALKPHOS 74  --   BILITOT 0.8  --    ------------------------------------------------------------------------------------------------------------------  Cardiac Enzymes  Recent Labs Lab 09/06/16 2338  TROPONINI <0.03   ------------------------------------------------------------------------------------------------------------------  RADIOLOGY:  Dg Chest 1 View  Result Date: 09/06/2016 CLINICAL DATA:  Fall.  Preoperative. EXAM: CHEST 1 VIEW COMPARISON:  05/30/2011 FINDINGS: Mild cardiac enlargement. Mild emphysematous changes in the lungs. No focal airspace disease or consolidation. Calcified granulomas in the right lung base. No blunting of costophrenic angles. No pneumothorax. Mediastinal contours appear intact. Calcified and tortuous aorta. Old left rib fractures. IMPRESSION: Emphysematous changes in the lungs. Cardiac enlargement. No evidence of active pulmonary disease. Electronically Signed   By: Lucienne Capers M.D.   On: 09/06/2016 23:44   Dg Pelvis 1-2 Views  Result Date: 09/07/2016 CLINICAL DATA:  Patient undergoing left total hip arthroplasty. Intraoperative image. EXAM: PELVIS - 1-2 VIEW COMPARISON:  2 single views of the left hip earlier today.  FINDINGS: Reaming device is in place in the left femur. Single cerclage wire just proximal to the lesser trochanter is seen. The left femoral head has been removed. No acute abnormality is identified. IMPRESSION: Left hip replacement in progress.  No acute abnormality. Electronically Signed   By: Inge Rise M.D.   On: 09/07/2016 15:23   Dg Pelvis 1-2 Views  Result Date: 09/07/2016 CLINICAL DATA:  Intraoperative hardware evaluation. EXAM: PELVIS - 1-2 VIEW COMPARISON:  None. FINDINGS: Single portable x-ray of the pelvis is provided. Left hip arthroplasty with the femoral stem component identified with a cerclage wire at the level of the lesser trochanter. Femoral neck component protrudes through the collar or prominently compared with the prior exam. IMPRESSION: Intraoperative x-ray for arthroplasty placement. Electronically Signed   By: Kathreen Devoid   On: 09/07/2016 15:23   Dg Pelvis 1-2 Views  Result Date: 09/07/2016 CLINICAL DATA:  Intraoperative hardware check EXAM: PELVIS - 1-2 VIEW COMPARISON:  None. FINDINGS: Single portable x-ray of the pelvis is provided. Left hip arthroplasty with the femoral stem component partially visualize. Single cerclage wire is partially visualized at the level of the lesser trochanter. No other radiopaque foreign bodies. IMPRESSION: Intraoperative localization as above. Electronically Signed   By: Kathreen Devoid   On: 09/07/2016 15:21   Dg Knee 1-2 Views Left  Result Date: 09/07/2016 CLINICAL DATA:  Left knee pain since a fall yesterday. Initial encounter. EXAM: LEFT KNEE - 1-2 VIEW COMPARISON:  None. FINDINGS: There is no acute bony or joint abnormality. Mild patellofemoral degenerative change is noted. Atherosclerotic calcifications are seen. No joint effusion. IMPRESSION: No acute abnormality. Mild patellofemoral degenerative disease. Atherosclerosis. Electronically Signed   By: Inge Rise M.D.   On: 09/07/2016 17:07   Ct Head Wo Contrast  Result Date:  09/06/2016 CLINICAL DATA:  Mechanical fall today. No loss of consciousness. Laceration to the left eye. EXAM: CT HEAD WITHOUT CONTRAST TECHNIQUE: Contiguous axial images were obtained from the base of the skull through the vertex without intravenous contrast. COMPARISON:  MRI brain 04/20/2016.  CT head 04/20/2016. FINDINGS: Brain: Well defined cystic lesion in the right frontal lobe measuring 3.2 x 3.9 cm in diameter. Mild calcification. This is unchanged since previous study. Mild diffuse cerebral atrophy. Mild ventricular dilatation consistent with central atrophy. No mass effect or midline shift. No abnormal extra-axial fluid collections. Gray-white matter junctions are distinct. Basal cisterns are not effaced. No acute intracranial hemorrhage. Vascular: Vascular calcifications are present. Skull: Calvarium appears intact. Sinuses/Orbits: Paranasal sinuses and mastoid air cells are clear. Other: No significant changes since previous study. IMPRESSION: No acute intracranial abnormalities. Chronic atrophy. Cystic lesion in the right anterior frontal lobe without change since previous study, likely benign. Electronically Signed   By: Lucienne Capers M.D.   On: 09/06/2016 23:05   Ct Hip Left Wo Contrast  Result Date: 09/07/2016 CLINICAL DATA:  Left hip fracture due to a fall yesterday. Treatment planning study. Initial encounter. EXAM: CT OF THE LEFT HIP WITHOUT CONTRAST TECHNIQUE: Multidetector CT imaging of the left hip was performed according to the standard protocol. Multiplanar CT image reconstructions were also generated. COMPARISON:  Plain films left hip 09/06/2016. FINDINGS: Bones/Joint/Cartilage The patient has an acute fracture of the left hip. The fracture extends from the subcapital aspect of the left hip superiorly in an inferior orientation through the base of the neck. There is no trochanteric extension. The femoral head remains located. No evidence  of pathologic fracture is identified.  Degenerative change about the symphysis pubis is noted. Ligaments Suboptimally assessed by CT. Muscles and Tendons Intact. Soft tissues Atherosclerosis is seen. Mild infiltration of subcutaneous fat over the lateral aspect of the left hip is consistent with contusion. IMPRESSION: Left hip fracture extends from the subcapital aspect of the hip superiorly to the base of the femoral neck of inferiorly. The greater and lesser trochanters and trochanteric ridge are spared. Electronically Signed   By: Inge Rise M.D.   On: 09/07/2016 10:53   Ct 3d Recon At Scanner  Result Date: 09/07/2016 CLINICAL DATA:  Nonspecific (abnormal) findings on radiological and other examination of musculoskeletal system. Preoperative planning study for patient with a left hip fracture suffered in a fall 09/06/2016. Initial encounter. EXAM: 3-DIMENSIONAL CT IMAGE RENDERING ON ACQUISITION WORKSTATION TECHNIQUE: 3-dimensional CT images were rendered by post-processing of the original CT data on an acquisition workstation. The 3-dimensional CT images were interpreted and findings were reported in the accompanying complete CT report for this study COMPARISON:  Plain films of the left hip 09/06/2016. FINDINGS: Surface rendered 3D imaging confirms finding dictated under accession [id number]. IMPRESSION: As above. Electronically Signed   By: Inge Rise M.D.   On: 09/07/2016 11:46   Dg Foot 2 Views Left  Result Date: 09/07/2016 CLINICAL DATA:  Left foot pain since a fall yesterday. Initial encounter. EXAM: LEFT FOOT - 2 VIEW COMPARISON:  Plain films left foot 09/06/2016. FINDINGS: Soft tissue swelling is seen over the dorsum of the foot as on yesterday's examination. No acute bony or joint abnormality is identified. Bones are osteopenic. No evidence of arthropathy. IMPRESSION: Soft tissue swelling without underlying acute bony or joint abnormality. Osteopenia. Electronically Signed   By: Inge Rise M.D.   On: 09/07/2016 17:06    Dg Foot Complete Left  Result Date: 09/06/2016 CLINICAL DATA:  Left foot pain and bruising after a fall. EXAM: LEFT FOOT - COMPLETE 3+ VIEW COMPARISON:  None. FINDINGS: Diffuse bone demineralization. No evidence of acute fracture or dislocation. Soft tissue swelling over the dorsum of the foot. No destructive or expansile bone lesions are appreciated. IMPRESSION: Diffuse bone demineralization. Dorsal soft tissue swelling. No acute fractures identified. Electronically Signed   By: Lucienne Capers M.D.   On: 09/06/2016 23:42   Dg Hip Port Unilat With Pelvis 1v Left  Result Date: 09/07/2016 CLINICAL DATA:  Status post left hip replacement due to a fracture suffered in a fall yesterday. Postoperative imaging. Initial encounter. EXAM: DG HIP (WITH OR WITHOUT PELVIS) 1V PORT LEFT COMPARISON:  Plain films left hip 09/06/2016. FINDINGS: Bipolar left hip hemiarthroplasty is in place with a single cerclage wire about the proximal femur. The device is located. No acute fracture is seen. Gas in the soft tissues from surgery and surgical staples are noted. IMPRESSION: Status post left hip replacement.  No acute abnormality. Electronically Signed   By: Inge Rise M.D.   On: 09/07/2016 17:08   Dg Hip Unilat W Or Wo Pelvis 2-3 Views Left  Result Date: 09/06/2016 CLINICAL DATA:  Left hip pain after fall. EXAM: DG HIP (WITH OR WITHOUT PELVIS) 2-3V LEFT COMPARISON:  None. FINDINGS: Diffuse bone demineralization. There is a transverse fracture at the base of the left femoral neck with probable focal extension into the lesser trochanter. There is varus angulation of the hip with impaction of fracture fragments and mild anterior displacement of the distal fracture fragment with posterior rotation of the femoral head. No dislocation at the  hip joint. The pelvis appears intact. Degenerative changes in the lower lumbar spine and both hips. Post kyphoplasty changes in the lower lumbar spine. Vascular calcifications are  present. IMPRESSION: Acute transverse fracture of the base of the left femoral neck with probable extension into the lesser trochanteric region. Varus angulation of the fracture fragments. Electronically Signed   By: Lucienne Capers M.D.   On: 09/06/2016 23:41    ASSESSMENT AND PLAN:   Active Problems:   Fracture of femoral neck, left (HCC)  #. Left femoral neck fracture -Pain control -Orthopedics consult with Dr. Mack Guise -hold aspirin - s/p hemiarthroplasty 09/07/16, tolerated well. - Check UA.  #.  History of hypertension -Continue Norvasc, hydralazine, Cozaar, metoprolol  #.  History of hyperlipidemia -Continue Lipitor  #. History of  TIA - Continue  hold aspirin  #. History of CKD, stable at baseline   All the records are reviewed and case discussed with Care Management/Social Workerr. Management plans discussed with the patient, family and they are in agreement.  CODE STATUS: DNR  TOTAL TIME TAKING CARE OF THIS PATIENT: 35 minutes.   POSSIBLE D/C IN 2-3 DAYS, DEPENDING ON CLINICAL CONDITION.   Vaughan Basta M.D on 09/07/2016   Between 7am to 6pm - Pager - [phone]  After 6pm go to [email or web address] - password [email or web address] [facility]  Office  [phone]  CC: Primary care physician; Kirk Ruths., MD  Note: This dictation was prepared with Dragon dictation along with smaller phrase technology. Any transcriptional errors that result from this process are unintentional.

## 2016-09-07 NOTE — Transfer of Care (Signed)
Immediate Anesthesia Transfer of Care Note  Patient: Jacqueline Sanchez  Procedure(s) Performed: Procedure(s): ARTHROPLASTY BIPOLAR HIP (HEMIARTHROPLASTY) (Left)  Patient Location: PACU  Anesthesia Type:Spinal  Level of Consciousness: awake, alert  and oriented  Airway & Oxygen Therapy: Patient Spontanous Breathing and Patient connected to nasal cannula oxygen  Post-op Assessment: Report given to RN and Post -op Vital signs reviewed and stable  Post vital signs: Reviewed and stable  Last Vitals:  Vitals:   09/07/16 1610 09/07/16 1611  BP: (!) 151/109 (!) 174/72  Pulse: 81 83  Resp: 20 17  Temp:      Last Pain:  Vitals:   09/07/16 1249  TempSrc: Tympanic  PainSc: 7          Complications: No apparent anesthesia complications

## 2016-09-07 NOTE — Progress Notes (Signed)
ANTICOAGULATION CONSULT NOTE - Initial Consult  Pharmacy Consult for enoxaparin dosing Indication: VTE prophylaxis after hemiarthroplasty  Allergies  Allergen Reactions  . Morphine And Related Nausea Only    Patient Measurements: Height: 5\' 4"  (162.6 cm) Weight: 139 lb (63 kg) IBW/kg (Calculated) : 54.7  Vital Signs: Temp: 99 F (37.2 C) (04/18 1608) Temp Source: Tympanic (04/18 1249) BP: 158/59 (04/18 1626) Pulse Rate: 80 (04/18 1626)  Labs:  Recent Labs  09/06/16 2338 09/07/16 0523  HGB 11.5* 11.4*  HCT 35.8 34.0*  PLT 292 272  APTT  --  28  LABPROT  --  11.8  INR  --  0.87  CREATININE 1.16* 1.06*  TROPONINI <0.03  --     Estimated Creatinine Clearance: 28 mL/min (A) (by C-G formula based on SCr of 1.06 mg/dL (H)).   Medical History: Past Medical History:  Diagnosis Date  . Aphasia 04/2016   Transient---??TIA  . Asthma   . CKD (chronic kidney disease)   . Edema   . Femur fracture, left ([place]) 09/06/2016  . GERD (gastroesophageal reflux disease)   . Hyperlipidemia   . Hypertension   . Osteoporosis     Medications:  Scheduled:  . [MAR Hold] amLODipine  10 mg Oral Daily  . [MAR Hold] atorvastatin  80 mg Oral Daily  . [MAR Hold] cholecalciferol  1,000 Units Oral Daily  . [START ON 09/08/2016] enoxaparin (LOVENOX) injection  30 mg Subcutaneous Q24H  . [MAR Hold] hydrALAZINE  25 mg Oral QID  . [MAR Hold] losartan  50 mg Oral Daily  . [MAR Hold] Melatonin  2.5 mg Oral QHS  . [MAR Hold] metoprolol succinate  50 mg Oral Daily  . [MAR Hold] multivitamin with minerals  1 tablet Oral Daily  . [MAR Hold] polyethylene glycol  17 g Oral Daily  . [MAR Hold] senna  2 tablet Oral BID  . [MAR Hold] vitamin B-12  1,000 mcg Oral Daily   Infusions:  . sodium chloride 60 mL/hr at 09/07/16 0354  . lactated ringers 50 mL/hr at 09/07/16 1259  . [MAR Hold] methocarbamol (ROBAXIN)  IV      Assessment: Pharmacy consulted to dose enoxaparin for VTE prophylaxis s/p  hemiarthoplasty in this 10 yoF with left femoral neck hip fracture. Baseline labs ordered. Current CrCl = 28 mL/min  Goal of Therapy:  Monitor platelets by anticoagulation protocol: Yes   Plan:  Due to CrCl < 30 mL/min will begin enoxaparin 30 mg daily for hip replacement ~16 hours after surgery. Will continue to monitor renal function and CBC  Darrow Bussing, PharmD Pharmacy Resident 09/07/2016 4:48 PM

## 2016-09-07 NOTE — Clinical Social Work Placement (Signed)
   CLINICAL SOCIAL WORK PLACEMENT  NOTE  Date:  09/07/2016  Patient Details  Name: Jacqueline Sanchez MRN: [id number] Date of Birth: August 08, 1919  Clinical Social Work is seeking post-discharge placement for this patient at the [place] level of care (*CSW will initial, date and re-position this form in  chart as items are completed):  Yes   Patient/family provided with [facility] Work Department's list of facilities offering this level of care within the geographic area requested by the patient (or if unable, by the patient's family).  Yes   Patient/family informed of their freedom to choose among providers that offer the needed level of care, that participate in Medicare, Medicaid or managed care program needed by the patient, have an available bed and are willing to accept the patient.  Yes   Patient/family informed of [facility]'s ownership interest in [facility] and [facility], as well as of the fact that they are under no obligation to receive care at these facilities.  PASRR submitted to EDS on       PASRR number received on       Existing PASRR number confirmed on 09/07/16     FL2 transmitted to all facilities in geographic area requested by pt/family on 09/07/16     FL2 transmitted to all facilities within larger geographic area on       Patient informed that his/her managed care company has contracts with or will negotiate with certain facilities, including the following:        Yes   Patient/family informed of bed offers received.  Patient chooses bed at  [facility] (SNF) )     Physician recommends and patient chooses bed at      Patient to be transferred to   on  .  Patient to be transferred to facility by       Patient family notified on   of transfer.  Name of family member notified:        PHYSICIAN       Additional Comment:    _______________________________________________ Keiasia Christianson, Veronia Beets, LCSW 09/07/2016,  3:50 PM

## 2016-09-07 NOTE — Op Note (Signed)
09/06/2016 - 09/07/2016  4:27 PM  PATIENT:  Jacqueline Sanchez   MRN: [id number]  PRE-OPERATIVE DIAGNOSIS:  Left femoral neck hip fracture  POST-OPERATIVE DIAGNOSIS:  left femoral neck hip fracture  PROCEDURE:  Procedure(s): LEFT HIP HEMIARTHROPLASTY   PREOPERATIVE INDICATIONS:    Jacqueline Sanchez is an 83 y.o. female who was admitted with a diagnosis a left femoral neck hip fracture  I have recommended surgical fixation with hemiarthroplasty for this injury. I have explained the surgery and the postoperative course to the patient and their son, Legrand Como, who agreed with surgical management of this fracture.    The risks benefits and alternatives were discussed with the patient and their family including but not limited to the risks of  infection requiring removal of the prosthesis, bleeding requiring blood transfusion, nerve injury especially to the sciatic nerve leading to foot drop or lower extremity numbness, periprosthetic fracture, dislocation leg length discrepancy, change in lower extremity rotation persistent hip pain, loosening or failure of the components and the need for revision surgery. Medical risks include but are not limited to DVT and pulmonary embolism, myocardial infarction, stroke, pneumonia, respiratory failure and death.  OPERATIVE REPORT     SURGEON:  Thornton Park, MD    ASSISTANT:  Surgical Tech    ANESTHESIA:  Spinal    COMPLICATIONS:  None.   SPECIMEN: Femoral head to pathology    COMPONENTS:  St[employer]ker Accolade Hfx femoral component size 4 with a 47 mm +0 neck adjustment sleeve.    PROCEDURE IN DETAIL:   The patient was met in the holding area and  identified.  The appropriate hip was identified and marked at the operative site after verbally confirming with the patient that this was the correct site of surgery.  The patient was then transported to the OR  and  underwent spinal anesthesia.  The patient was then placed in the lateral decubitus position with  the operative side up and secured on the operating room table with a pegboard and all bony prominences were adequately padded. This included an axillary roll and additional padding around the nonoperative leg to prevent compression to the common peroneal nerve.    The operative lower extremity was prepped and draped in a sterile fashion.  A time out was performed prior to incision to verify patient's name, date of birth, medical record number, correct site of surgery correct procedure to be performed. The timeout was also used to verify the patient received antibiotics now appropriate instruments, implants and radiographic studies were available in the room. Once all in attendance were in agreement case began.    A posterolateral approach was utilized via sharp dissection  carried down to the subcutaneous tissue.  Bleeding vessels were coagulated using electrocautery.  The fascia lata was identified and incised along the length of the skin incision.  The gluteus maximus muscle was then split in line with its fibers. Self-retaining retractors were  inserted.  With the hip internally rotated, the short external rotators  were identified and removed from the posterior attachment from the greater trochanter. The piriformis was tagged for later repair. The capsule was identified and a T-shaped capsulotomy was performed. The capsule was tagged with #2 Tycron for later repair.  The femoral neck fracture was exposed, and the femoral head was removed using a corkscrew device. This was measured to be 47 mm in diameter. The attention was then turned to proximal femur preparation.  An oscillating saw was used to perform a proximal femoral osteotomy 1  fingerbreadth above the lesser trochanter. The trial 47 mm femoral head was placed into the acetabulum and had an excellent suction fit. The attention was then turned back to femoral preparation.  The patient's femoral neck hip fracture extended medially to the level of the  lesser trochanter. To avoid the risk for splitting the femur during femoral stem implantation, the decision was made to place a Dall-Miles cable around the femoral neck.  A cable passer was placed around the proximal femur. The Dall-Miles cable was then brought down to approximate the femoral neck. The position of the wire was checked. It was then tensioned and crimped. The excess wire was cut.  Attention was then turned back to femoral canal preparation.  A femoral skid and Cobra retractor were placed under the femoral neck to allow for adequate visualization. A box osteotome was used to make the initial entry into the proximal femur. A single hand reamer was used to prepare the femoral canal. A T-shaped femoral canal sounder was then used to ensure no penetration femoral cortex had occurred during reaming. The proximal femur was then sequentially broached by hand. A size 4 femoral trial broach was found to have best medial to lateral canal fit. Once adequate mediolateral canal fill was achieved the trial femoral broach, neck, and head was assembled and the hip was reduced. With the trial components in place, intra-operative pelvis x-rays were taken with a standard, +4 and +12 trial inner head components in place. The leg lengths were found to be equivalent by radiograph with a +0 trial head. Patient had good stability and full range of motion with this trial construct. The trial components were then removed.  I copiously irrigated the femoral canal and then impacted the real femoral prosthesis into place into the appropriate version, slightly anteverted to the normal anatomy, and I impacted the actual 47 mm Unitrax femoral component with a standard neck adjustment sleeve into place. The hip was then reduced and taken through functional range of motion and found to have excellent stability. Leg lengths were restored. The hip joint was copiously irrigated.   A soft tissue repair of the capsule and external  rotators was performed using #2 Tycron Excellent posterior capsular repair was achieved. The fascia lata was then closed with interrupted 0 Vicryl suture. The subcutaneous tissues were closed with 2-0 Vicryl and the skin approximated with staples.   The patient was then placed supine on the operative table. Leg lengths were checked clinically and found to be equivalent. An abduction pillow was placed between the lower extremities. The patient was then transferred to a hospital bed and brought to the PACU in stable condition. I was scrubbed and present the entire case and all sharp and instrument counts were correct at the conclusion of the case. I spoke with the patient's son, Legrand Como,  by phone from the PACU to let him know the case was performed without complication patient was stable in recovery room.   Timoteo Gaul, MD Orthopedic Surgeon

## 2016-09-07 NOTE — Progress Notes (Signed)
Also notified Dr. Estanislado Pandy that Dr. Margarito Courser note states patient is DNR but order states full code. Order received for DNR.

## 2016-09-07 NOTE — Progress Notes (Signed)
I have seen the pt, she have hip fracture. Medically she is stabilized to undergo surgery with mild to moderate risk. We can proceed to surgery as planned today afternoon.

## 2016-09-07 NOTE — Consult Note (Signed)
ORTHOPAEDIC CONSULTATION  REQUESTING PHYSICIAN: Vaughan Basta, MD  Chief Complaint: Left hip pain status post fall  HPI: Jacqueline Sanchez is a 83 y.o. female was admitted overnight after sustaining a fall at home. Patient lives at [facility].  Patient believes she tripped over the comforter on her bed. She uses a cane at baseline. Patient was unable to stand or ambulate following her injury. Plain x-rays and CT scan of the left hip at [facility] have revealed a displaced femoral neck hip fracture. Patient is complaining of left hip pain but denies numbness or tingling or focal motor deficits.  Past Medical History:  Diagnosis Date  . Aphasia 04/2016   Transient---??TIA  . Asthma   . CKD (chronic kidney disease)   . Edema   . Femur fracture, left ([place]) 09/06/2016  . GERD (gastroesophageal reflux disease)   . Hyperlipidemia   . Hypertension   . Osteoporosis    Past Surgical History:  Procedure Laterality Date  . KYPHOPLASTY     Social History   Social History  . Marital status: Widowed    Spouse name: N/A  . Number of children: 2  . Years of education: N/A   Occupational History  . 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 Smoking status: Never Smoker  . Smokeless tobacco: Never Used  . Alcohol use No  . Drug use: Unknown  . Sexual activity: No   Other Topics Concern  . None   Social History Narrative   Widowed 2000   Has living will   Sons/DILs are health care POAs (mostly Mike/Jan)   Has DNR   No tube feeds if cognitively unaware   Family History  Problem Relation Age of Onset  . Hypertension Other    Allergies  Allergen Reactions  . Morphine And Related Nausea Only   Prior to Admission medications   Medication Sig Start Date End Date Taking? Authorizing Provider  amLODipine (NORVASC) 10 MG tablet Take 1 tablet (10 mg total) by mouth daily. 04/23/16  Yes Fritzi Mandes, MD  aspirin EC 81 MG tablet Take 81 mg by mouth every other day.    Yes Historical Provider, MD  atorvastatin (LIPITOR) 80 MG tablet Take 1 tablet by mouth daily.   Yes Historical Provider, MD  hydrALAZINE (APRESOLINE) 25 MG tablet Take 1 tablet (25 mg total) by mouth 4 (four) times daily. 04/23/16  Yes Fritzi Mandes, MD  losartan (COZAAR) 50 MG tablet Take 1 tablet by mouth daily. 03/08/16  Yes Historical Provider, MD  Melatonin 3 MG TABS Take 1 tablet by mouth at bedtime.   Yes Historical Provider, MD  metoprolol succinate (TOPROL-XL) 50 MG 24 hr tablet Take 1 tablet (50 mg total) by mouth daily. Take with or immediately following a meal. 04/23/16  Yes Fritzi Mandes, MD  Multiple Vitamin (MULTI-VITAMINS) TABS Take 1 tablet by mouth daily.   Yes Historical Provider, MD  oxyCODONE-acetaminophen (PERCOCET/ROXICET) 5-325 MG tablet Take 1 tablet by mouth 2 (two) times daily. And BID prn   Yes Historical Provider, MD  polyethylene glycol (MIRALAX / GLYCOLAX) packet Take 17 g by mouth daily.   Yes Historical Provider, MD  senna (SENOKOT) 8.6 MG tablet Take 2 tablets by mouth 2 (two) times daily.    Yes Historical Provider, MD  vitamin B-12 (CYANOCOBALAMIN) 1000 MCG tablet Take 1,000 mcg by mouth daily.   Yes Historical Provider, MD  Vitamin D, Cholecalciferol, 1000 units CAPS Take 1 capsule by mouth daily.  Yes Historical Provider, MD   Dg Chest 1 View  Result Date: 09/06/2016 CLINICAL DATA:  Fall.  Preoperative. EXAM: CHEST 1 VIEW COMPARISON:  05/30/2011 FINDINGS: Mild cardiac enlargement. Mild emphysematous changes in the lungs. No focal airspace disease or consolidation. Calcified granulomas in the right lung base. No blunting of costophrenic angles. No pneumothorax. Mediastinal contours appear intact. Calcified and tortuous aorta. Old left rib fractures. IMPRESSION: Emphysematous changes in the lungs. Cardiac enlargement. No evidence of active pulmonary disease. Electronically Signed   By: Lucienne Capers M.D.   On: 09/06/2016 23:44   Ct Head Wo Contrast  Result Date:  09/06/2016 CLINICAL DATA:  Mechanical fall today. No loss of consciousness. Laceration to the left eye. EXAM: CT HEAD WITHOUT CONTRAST TECHNIQUE: Contiguous axial images were obtained from the base of the skull through the vertex without intravenous contrast. COMPARISON:  MRI brain 04/20/2016.  CT head 04/20/2016. FINDINGS: Brain: Well defined cystic lesion in the right frontal lobe measuring 3.2 x 3.9 cm in diameter. Mild calcification. This is unchanged since previous study. Mild diffuse cerebral atrophy. Mild ventricular dilatation consistent with central atrophy. No mass effect or midline shift. No abnormal extra-axial fluid collections. Gray-white matter junctions are distinct. Basal cisterns are not effaced. No acute intracranial hemorrhage. Vascular: Vascular calcifications are present. Skull: Calvarium appears intact. Sinuses/Orbits: Paranasal sinuses and mastoid air cells are clear. Other: No significant changes since previous study. IMPRESSION: No acute intracranial abnormalities. Chronic atrophy. Cystic lesion in the right anterior frontal lobe without change since previous study, likely benign. Electronically Signed   By: Lucienne Capers M.D.   On: 09/06/2016 23:05   Ct Hip Left Wo Contrast  Result Date: 09/07/2016 CLINICAL DATA:  Left hip fracture due to a fall yesterday. Treatment planning study. Initial encounter. EXAM: CT OF THE LEFT HIP WITHOUT CONTRAST TECHNIQUE: Multidetector CT imaging of the left hip was performed according to the standard protocol. Multiplanar CT image reconstructions were also generated. COMPARISON:  Plain films left hip 09/06/2016. FINDINGS: Bones/Joint/Cartilage The patient has an acute fracture of the left hip. The fracture extends from the subcapital aspect of the left hip superiorly in an inferior orientation through the base of the neck. There is no trochanteric extension. The femoral head remains located. No evidence of pathologic fracture is identified.  Degenerative change about the symphysis pubis is noted. Ligaments Suboptimally assessed by CT. Muscles and Tendons Intact. Soft tissues Atherosclerosis is seen. Mild infiltration of subcutaneous fat over the lateral aspect of the left hip is consistent with contusion. IMPRESSION: Left hip fracture extends from the subcapital aspect of the hip superiorly to the base of the femoral neck of inferiorly. The greater and lesser trochanters and trochanteric ridge are spared. Electronically Signed   By: Inge Rise M.D.   On: 09/07/2016 10:53   Ct 3d Recon At Scanner  Result Date: 09/07/2016 CLINICAL DATA:  Nonspecific (abnormal) findings on radiological and other examination of musculoskeletal system. Preoperative planning study for patient with a left hip fracture suffered in a fall 09/06/2016. Initial encounter. EXAM: 3-DIMENSIONAL CT IMAGE RENDERING ON ACQUISITION WORKSTATION TECHNIQUE: 3-dimensional CT images were rendered by post-processing of the original CT data on an acquisition workstation. The 3-dimensional CT images were interpreted and findings were reported in the accompanying complete CT report for this study COMPARISON:  Plain films of the left hip 09/06/2016. FINDINGS: Surface rendered 3D imaging confirms finding dictated under accession [id number]. IMPRESSION: As above. Electronically Signed   By: Inge Rise M.D.   On:  09/07/2016 11:46   Dg Foot Complete Left  Result Date: 09/06/2016 CLINICAL DATA:  Left foot pain and bruising after a fall. EXAM: LEFT FOOT - COMPLETE 3+ VIEW COMPARISON:  None. FINDINGS: Diffuse bone demineralization. No evidence of acute fracture or dislocation. Soft tissue swelling over the dorsum of the foot. No destructive or expansile bone lesions are appreciated. IMPRESSION: Diffuse bone demineralization. Dorsal soft tissue swelling. No acute fractures identified. Electronically Signed   By: Lucienne Capers M.D.   On: 09/06/2016 23:42   Dg Hip Unilat W Or Wo  Pelvis 2-3 Views Left  Result Date: 09/06/2016 CLINICAL DATA:  Left hip pain after fall. EXAM: DG HIP (WITH OR WITHOUT PELVIS) 2-3V LEFT COMPARISON:  None. FINDINGS: Diffuse bone demineralization. There is a transverse fracture at the base of the left femoral neck with probable focal extension into the lesser trochanter. There is varus angulation of the hip with impaction of fracture fragments and mild anterior displacement of the distal fracture fragment with posterior rotation of the femoral head. No dislocation at the hip joint. The pelvis appears intact. Degenerative changes in the lower lumbar spine and both hips. Post kyphoplasty changes in the lower lumbar spine. Vascular calcifications are present. IMPRESSION: Acute transverse fracture of the base of the left femoral neck with probable extension into the lesser trochanteric region. Varus angulation of the fracture fragments. Electronically Signed   By: Lucienne Capers M.D.   On: 09/06/2016 23:41    Positive ROS: All other systems have been reviewed and were otherwise negative with the exception of those mentioned in the HPI and as above.  Physical Exam: General: Alert, no acute distress  MUSCULOSKELETAL: Left lower extremity: Patient's skin is intact. There is no erythema or ecchymosis or edema. Patient's thigh compartments are soft and compressible. She has palpable pedal pulses, intact sensation light touch and intact motor function.  Assessment: Displaced left femoral neck hip fracture  Plan: I explained to the patient that she has a displaced fracture of the femoral neck. I am recommending a left hip hemiarthroplasty as treatment for this injury. I personally reviewed the plain films and the CT scan of the left hip for surgical planning. I reviewed the details of the procedure as well as postoperative course with both the patient and her son, Legrand Como, by phone.    They understand the risks of surgery include infection requiring removal  of prosthesis, bleeding requiring blood transfusion, nerve or blood vessel injury including injury to the sciatic nerve leading to foot drop, leg length discrepancy, change in lower extremity rotation, dislocation, fracture, persistent left hip pain and the need for further surgery including conversion to a total hip arthroplasty. They also understand the medical risks include DVT and pulmonary embolism, myocardial infarction, stroke, pneumonia, respiratory failure and death. They understood these risks and wished to proceed with surgery.  I reviewed all the patient's laboratory studies. She has been cleared by the hospitalist service for surgery. I've spoken to Dr. Anselm Jungling from the hospitalist service regarding this patient.    Thornton Park, MD    09/07/2016 12:55 PM

## 2016-09-07 NOTE — Anesthesia Procedure Notes (Signed)
Spinal  Patient location during procedure: OR Start time: 09/07/2016 1:30 PM End time: 09/07/2016 1:37 PM Staffing Anesthesiologist: Alvin Critchley Performed: anesthesiologist  Preanesthetic Checklist Completed: patient identified, site marked, surgical consent, pre-op evaluation, timeout performed, IV checked, risks and benefits discussed and monitors and equipment checked Spinal Block Patient position: sitting Prep: Betadine and site prepped and draped Patient monitoring: heart rate, cardiac monitor, continuous pulse ox and blood pressure Approach: midline Location: L3-4 Injection technique: single-shot Needle Needle type: Whitacre  Needle gauge: 24 G Assessment Sensory level: T8 Additional Notes Time out called.  Patient placed in sitting position after sedation.  Back prepped and draped in sterile fashion.  A skin wheal was made in the L3-L4 interspace with 1% Lidocaine plain.  A 24 G whitacre needle was advanced with the return of clear, colorless CSF in all 4 quadrants.  No paresthesias and no blood.  10mg  of marcaine + 4mg  of  Tetracaine were injected.  Patient tolerated the procedure well.

## 2016-09-08 ENCOUNTER — Encounter: Payer: Self-pay | Admitting: Orthopedic Surgery

## 2016-09-08 LAB — CBC
HEMATOCRIT: 27.7 % — AB (ref 35.0–47.0)
Hemoglobin: 9.2 g/dL — ABNORMAL LOW (ref 12.0–16.0)
MCH: 32.9 pg (ref 26.0–34.0)
MCHC: 33.3 g/dL (ref 32.0–36.0)
MCV: 99 fL (ref 80.0–100.0)
Platelets: 208 10*3/uL (ref 150–440)
RBC: 2.8 MIL/uL — ABNORMAL LOW (ref 3.80–5.20)
RDW: 13.8 % (ref 11.5–14.5)
WBC: 8.6 10*3/uL (ref 3.6–11.0)

## 2016-09-08 LAB — URINALYSIS, COMPLETE (UACMP) WITH MICROSCOPIC
Bilirubin Urine: NEGATIVE
Glucose, UA: NEGATIVE mg/dL
HGB URINE DIPSTICK: NEGATIVE
Ketones, ur: NEGATIVE mg/dL
LEUKOCYTES UA: NEGATIVE
Nitrite: NEGATIVE
PROTEIN: 30 mg/dL — AB
Specific Gravity, Urine: 1.017 (ref 1.005–1.030)
pH: 5 (ref 5.0–8.0)

## 2016-09-08 LAB — BASIC METABOLIC PANEL
Anion gap: 4 — ABNORMAL LOW (ref 5–15)
BUN: 14 mg/dL (ref 6–20)
CALCIUM: 9.1 mg/dL (ref 8.9–10.3)
CO2: 26 mmol/L (ref 22–32)
CREATININE: 1.08 mg/dL — AB (ref 0.44–1.00)
Chloride: 108 mmol/L (ref 101–111)
GFR calc Af Amer: 49 mL/min — ABNORMAL LOW (ref >60)
GFR calc non Af Amer: 43 mL/min — ABNORMAL LOW (ref >60)
GLUCOSE: 123 mg/dL — AB (ref 65–99)
Potassium: 3.9 mmol/L (ref 3.5–5.1)
Sodium: 138 mmol/L (ref 135–145)

## 2016-09-08 NOTE — Progress Notes (Signed)
Subjective:  Postoperative day #1 status post left hip hemiarthroplasty. Patient reports left hip pain as mild to moderate.  She was up out of bed to a chair. He is currently working with occupational therapy. Patient has no pain in the left hip at rest but feels moderate pain with movement.  Objective:   VITALS:   Vitals:   09/08/16 0101 09/08/16 0200 09/08/16 0300 09/08/16 0755  BP: (!) 141/51 (!) 130/49 (!) 139/57 (!) 147/53  Pulse: 84 86 82 94  Resp:    18  Temp:    98.9 F (37.2 C)  TempSrc:    Oral  SpO2: 97%   97%  Weight:      Height:        PHYSICAL EXAM:  Left lower extremity: Neurovascular intact Sensation intact distally Intact pulses distally Dorsiflexion/Plantar flexion intact Incision: dressing C/D/I No cellulitis present Compartment soft  LABS  Results for orders placed or performed during the hospital encounter of 09/06/16 (from the past 24 hour(s))  Urinalysis, Complete w Microscopic     Status: Abnormal   Collection Time: 09/08/16  5:00 AM  Result Value Ref Range   Color, Urine YELLOW (A) YELLOW   APPearance CLEAR (A) CLEAR   Specific Gravity, Urine 1.017 1.005 - 1.030   pH 5.0 5.0 - 8.0   Glucose, UA NEGATIVE NEGATIVE mg/dL   Hgb urine dipstick NEGATIVE NEGATIVE   Bilirubin Urine NEGATIVE NEGATIVE   Ketones, ur NEGATIVE NEGATIVE mg/dL   Protein, ur 30 (A) NEGATIVE mg/dL   Nitrite NEGATIVE NEGATIVE   Leukocytes, UA NEGATIVE NEGATIVE   RBC / HPF 0-5 0 - 5 RBC/hpf   WBC, UA 6-30 0 - 5 WBC/hpf   Bacteria, UA RARE (A) NONE SEEN   Squamous Epithelial / LPF 0-5 (A) NONE SEEN   Mucous PRESENT    Hyaline Casts, UA PRESENT   CBC     Status: Abnormal   Collection Time: 09/08/16  5:02 AM  Result Value Ref Range   WBC 8.6 3.6 - 11.0 K/uL   RBC 2.80 (L) 3.80 - 5.20 MIL/uL   Hemoglobin 9.2 (L) 12.0 - 16.0 g/dL   HCT 27.7 (L) 35.0 - 47.0 %   MCV 99.0 80.0 - 100.0 fL   MCH 32.9 26.0 - 34.0 pg   MCHC 33.3 32.0 - 36.0 g/dL   RDW 13.8 11.5 - 14.5 %    Platelets 208 150 - 440 K/uL  Basic metabolic panel     Status: Abnormal   Collection Time: 09/08/16  5:02 AM  Result Value Ref Range   Sodium 138 135 - 145 mmol/L   Potassium 3.9 3.5 - 5.1 mmol/L   Chloride 108 101 - 111 mmol/L   CO2 26 22 - 32 mmol/L   Glucose, Bld 123 (H) 65 - 99 mg/dL   BUN 14 6 - 20 mg/dL   Creatinine, Ser 1.08 (H) 0.44 - 1.00 mg/dL   Calcium 9.1 8.9 - 10.3 mg/dL   GFR calc non Af Amer 43 (L) >60 mL/min   GFR calc Af Amer 49 (L) >60 mL/min   Anion gap 4 (L) 5 - 15    Dg Chest 1 View  Result Date: 09/06/2016 CLINICAL DATA:  Fall.  Preoperative. EXAM: CHEST 1 VIEW COMPARISON:  05/30/2011 FINDINGS: Mild cardiac enlargement. Mild emphysematous changes in the lungs. No focal airspace disease or consolidation. Calcified granulomas in the right lung base. No blunting of costophrenic angles. No pneumothorax. Mediastinal contours appear intact. Calcified and tortuous  aorta. Old left rib fractures. IMPRESSION: Emphysematous changes in the lungs. Cardiac enlargement. No evidence of active pulmonary disease. Electronically Signed   By: Lucienne Capers M.D.   On: 09/06/2016 23:44   Dg Pelvis 1-2 Views  Result Date: 09/07/2016 CLINICAL DATA:  Patient undergoing left total hip arthroplasty. Intraoperative image. EXAM: PELVIS - 1-2 VIEW COMPARISON:  2 single views of the left hip earlier today. FINDINGS: Reaming device is in place in the left femur. Single cerclage wire just proximal to the lesser trochanter is seen. The left femoral head has been removed. No acute abnormality is identified. IMPRESSION: Left hip replacement in progress.  No acute abnormality. Electronically Signed   By: Inge Rise M.D.   On: 09/07/2016 15:23   Dg Pelvis 1-2 Views  Result Date: 09/07/2016 CLINICAL DATA:  Intraoperative hardware evaluation. EXAM: PELVIS - 1-2 VIEW COMPARISON:  None. FINDINGS: Single portable x-ray of the pelvis is provided. Left hip arthroplasty with the femoral stem component  identified with a cerclage wire at the level of the lesser trochanter. Femoral neck component protrudes through the collar or prominently compared with the prior exam. IMPRESSION: Intraoperative x-ray for arthroplasty placement. Electronically Signed   By: Kathreen Devoid   On: 09/07/2016 15:23   Dg Pelvis 1-2 Views  Result Date: 09/07/2016 CLINICAL DATA:  Intraoperative hardware check EXAM: PELVIS - 1-2 VIEW COMPARISON:  None. FINDINGS: Single portable x-ray of the pelvis is provided. Left hip arthroplasty with the femoral stem component partially visualize. Single cerclage wire is partially visualized at the level of the lesser trochanter. No other radiopaque foreign bodies. IMPRESSION: Intraoperative localization as above. Electronically Signed   By: Kathreen Devoid   On: 09/07/2016 15:21   Dg Knee 1-2 Views Left  Result Date: 09/07/2016 CLINICAL DATA:  Left knee pain since a fall yesterday. Initial encounter. EXAM: LEFT KNEE - 1-2 VIEW COMPARISON:  None. FINDINGS: There is no acute bony or joint abnormality. Mild patellofemoral degenerative change is noted. Atherosclerotic calcifications are seen. No joint effusion. IMPRESSION: No acute abnormality. Mild patellofemoral degenerative disease. Atherosclerosis. Electronically Signed   By: Inge Rise M.D.   On: 09/07/2016 17:07   Ct Head Wo Contrast  Result Date: 09/06/2016 CLINICAL DATA:  Mechanical fall today. No loss of consciousness. Laceration to the left eye. EXAM: CT HEAD WITHOUT CONTRAST TECHNIQUE: Contiguous axial images were obtained from the base of the skull through the vertex without intravenous contrast. COMPARISON:  MRI brain 04/20/2016.  CT head 04/20/2016. FINDINGS: Brain: Well defined cystic lesion in the right frontal lobe measuring 3.2 x 3.9 cm in diameter. Mild calcification. This is unchanged since previous study. Mild diffuse cerebral atrophy. Mild ventricular dilatation consistent with central atrophy. No mass effect or midline  shift. No abnormal extra-axial fluid collections. Gray-white matter junctions are distinct. Basal cisterns are not effaced. No acute intracranial hemorrhage. Vascular: Vascular calcifications are present. Skull: Calvarium appears intact. Sinuses/Orbits: Paranasal sinuses and mastoid air cells are clear. Other: No significant changes since previous study. IMPRESSION: No acute intracranial abnormalities. Chronic atrophy. Cystic lesion in the right anterior frontal lobe without change since previous study, likely benign. Electronically Signed   By: Lucienne Capers M.D.   On: 09/06/2016 23:05   Ct Hip Left Wo Contrast  Result Date: 09/07/2016 CLINICAL DATA:  Left hip fracture due to a fall yesterday. Treatment planning study. Initial encounter. EXAM: CT OF THE LEFT HIP WITHOUT CONTRAST TECHNIQUE: Multidetector CT imaging of the left hip was performed according to the standard  protocol. Multiplanar CT image reconstructions were also generated. COMPARISON:  Plain films left hip 09/06/2016. FINDINGS: Bones/Joint/Cartilage The patient has an acute fracture of the left hip. The fracture extends from the subcapital aspect of the left hip superiorly in an inferior orientation through the base of the neck. There is no trochanteric extension. The femoral head remains located. No evidence of pathologic fracture is identified. Degenerative change about the symphysis pubis is noted. Ligaments Suboptimally assessed by CT. Muscles and Tendons Intact. Soft tissues Atherosclerosis is seen. Mild infiltration of subcutaneous fat over the lateral aspect of the left hip is consistent with contusion. IMPRESSION: Left hip fracture extends from the subcapital aspect of the hip superiorly to the base of the femoral neck of inferiorly. The greater and lesser trochanters and trochanteric ridge are spared. Electronically Signed   By: Inge Rise M.D.   On: 09/07/2016 10:53   Ct 3d Recon At Scanner  Result Date: 09/07/2016 CLINICAL  DATA:  Nonspecific (abnormal) findings on radiological and other examination of musculoskeletal system. Preoperative planning study for patient with a left hip fracture suffered in a fall 09/06/2016. Initial encounter. EXAM: 3-DIMENSIONAL CT IMAGE RENDERING ON ACQUISITION WORKSTATION TECHNIQUE: 3-dimensional CT images were rendered by post-processing of the original CT data on an acquisition workstation. The 3-dimensional CT images were interpreted and findings were reported in the accompanying complete CT report for this study COMPARISON:  Plain films of the left hip 09/06/2016. FINDINGS: Surface rendered 3D imaging confirms finding dictated under accession [id number]. IMPRESSION: As above. Electronically Signed   By: Inge Rise M.D.   On: 09/07/2016 11:46   Dg Foot 2 Views Left  Result Date: 09/07/2016 CLINICAL DATA:  Left foot pain since a fall yesterday. Initial encounter. EXAM: LEFT FOOT - 2 VIEW COMPARISON:  Plain films left foot 09/06/2016. FINDINGS: Soft tissue swelling is seen over the dorsum of the foot as on yesterday's examination. No acute bony or joint abnormality is identified. Bones are osteopenic. No evidence of arthropathy. IMPRESSION: Soft tissue swelling without underlying acute bony or joint abnormality. Osteopenia. Electronically Signed   By: Inge Rise M.D.   On: 09/07/2016 17:06   Dg Foot Complete Left  Result Date: 09/06/2016 CLINICAL DATA:  Left foot pain and bruising after a fall. EXAM: LEFT FOOT - COMPLETE 3+ VIEW COMPARISON:  None. FINDINGS: Diffuse bone demineralization. No evidence of acute fracture or dislocation. Soft tissue swelling over the dorsum of the foot. No destructive or expansile bone lesions are appreciated. IMPRESSION: Diffuse bone demineralization. Dorsal soft tissue swelling. No acute fractures identified. Electronically Signed   By: Lucienne Capers M.D.   On: 09/06/2016 23:42   Dg Hip Port Unilat With Pelvis 1v Left  Result Date:  09/07/2016 CLINICAL DATA:  Status post left hip replacement due to a fracture suffered in a fall yesterday. Postoperative imaging. Initial encounter. EXAM: DG HIP (WITH OR WITHOUT PELVIS) 1V PORT LEFT COMPARISON:  Plain films left hip 09/06/2016. FINDINGS: Bipolar left hip hemiarthroplasty is in place with a single cerclage wire about the proximal femur. The device is located. No acute fracture is seen. Gas in the soft tissues from surgery and surgical staples are noted. IMPRESSION: Status post left hip replacement.  No acute abnormality. Electronically Signed   By: Inge Rise M.D.   On: 09/07/2016 17:08   Dg Hip Unilat W Or Wo Pelvis 2-3 Views Left  Result Date: 09/06/2016 CLINICAL DATA:  Left hip pain after fall. EXAM: DG HIP (WITH OR WITHOUT PELVIS) 2-3V  LEFT COMPARISON:  None. FINDINGS: Diffuse bone demineralization. There is a transverse fracture at the base of the left femoral neck with probable focal extension into the lesser trochanter. There is varus angulation of the hip with impaction of fracture fragments and mild anterior displacement of the distal fracture fragment with posterior rotation of the femoral head. No dislocation at the hip joint. The pelvis appears intact. Degenerative changes in the lower lumbar spine and both hips. Post kyphoplasty changes in the lower lumbar spine. Vascular calcifications are present. IMPRESSION: Acute transverse fracture of the base of the left femoral neck with probable extension into the lesser trochanteric region. Varus angulation of the fracture fragments. Electronically Signed   By: Lucienne Capers M.D.   On: 09/06/2016 23:41    Assessment/Plan: 1 Day Post-Op   Active Problems:   Fracture of femoral neck, left ([place])  Patient is doing well postop. She is completed 24 hours of postop antibiotics. Her Foley catheter was removed. Hemoglobin and hematocrit are stable. Vital signs are stable. Continue with physical and occupational therapy. Patient  will be discharged back to [facility] when cleared by medicine.  She'll follow up with me in 10-14 days in the office after discharge.  Patient will be on posterior hip precautions. She may weight-bear as tolerated on the left lower extremity. Continue TED stockings until follow-up. Recommend Lovenox 30 mg daily for 4 weeks postop for DVT prophylaxis.   Thornton Park , MD 09/08/2016, 11:37 AM

## 2016-09-08 NOTE — Anesthesia Postprocedure Evaluation (Signed)
Anesthesia Post Note  Patient: Jacqueline Sanchez  Procedure(s) Performed: Procedure(s) (LRB): ARTHROPLASTY BIPOLAR HIP (HEMIARTHROPLASTY) (Left)  Patient location during evaluation: Nursing Unit Anesthesia Type: Spinal Level of consciousness: awake, awake and alert and oriented Pain management: pain level controlled Vital Signs Assessment: post-procedure vital signs reviewed and stable Respiratory status: spontaneous breathing, nonlabored ventilation and respiratory function stable Cardiovascular status: stable Postop Assessment: no headache and no backache Anesthetic complications: no     Last Vitals:  Vitals:   09/08/16 0200 09/08/16 0300  BP: (!) 130/49 (!) 139/57  Pulse: 86 82  Resp:    Temp:      Last Pain:  Vitals:   09/08/16 0634  TempSrc:   PainSc: 5                  Lunden Stieber,  Malaysha Arlen R

## 2016-09-08 NOTE — Progress Notes (Signed)
Plan is for patient to D/C to [facility] Saturday 09/10/16 pending medical clearance. Patient is in the bundle program. Seth Bake admissions coordinator at [facility] is aware of above. Clinical Social Worker (CSW) will continue to follow and assist as needed.   [employer], LCSW [phone]

## 2016-09-08 NOTE — Evaluation (Signed)
Physical Therapy Evaluation Patient Details Name: Jacqueline Sanchez MRN: [id number] DOB: [birth year] Today's Date: 09/08/2016   History of Present Illness  83 y.o. female with a known history of Asthma, CKD 3, GERD, hypertension, hyperlipidemia, osteoporosis, TIA presents to the emergency department for evaluation of left hip pain following a mechanical  fall.  Patient was in a usual state of health until  this afternoon when she tripped on her bed linens landing on her left hip and side.  She suffered a hip fx and had subsequent hip replacement sx 4/18.  Clinical Impression  Pt was eager to participate with PT and showed good effort but ultimately she was limited with how much she could do/tolerate.  She was able to do ~10 minutes of supine exercises with mostly AAROM w/o excessive c/o pain.  She did need considerable assist with all mobility - in bed, getting to stand and during very labored and effortful ambulation.     Follow Up Recommendations SNF    Equipment Recommendations       Recommendations for Other Services       Precautions / Restrictions Precautions Precautions: Posterior Hip;Fall Restrictions Weight Bearing Restrictions: Yes LLE Weight Bearing: Weight bearing as tolerated      Mobility  Bed Mobility Overal bed mobility: Needs Assistance Bed Mobility: Supine to Sit     Supine to sit: Mod assist     General bed mobility comments: Pt showed good effort, needed considerable assist to get hips/LEs to EOB as well as to fully lift trunk to upright position  Transfers Overall transfer level: Needs assistance   Transfers: Sit to/from Stand Sit to Stand: Min assist;Mod assist         General transfer comment: Pt initially hesitant to get up to standing and needed heavy cuing.  However, once we we positioned appropriately and cues given to sequencing she was able to rise w/o heavy assist - did need help to attain upright posture and use walker  appropriately  Ambulation/Gait Ambulation/Gait assistance: Mod assist Ambulation Distance (Feet): 5 Feet Assistive device: Rolling walker (2 wheeled)       General Gait Details: Pt with very cautious/hesitant gait.  She showed good effort but struggled to take much weight though the L LE and was heavily reliant on the walker, needed some PT assist to [facility] and was unable to ever advance R LE past L   Stairs            Wheelchair Mobility    Modified Rankin (Stroke Patients Only)       Balance Overall balance assessment: Needs assistance Sitting-balance support: Bilateral upper extremity supported Sitting balance-Leahy Scale: Fair     Standing balance support: Bilateral upper extremity supported Standing balance-Leahy Scale: Poor                               Pertinent Vitals/Pain Pain Assessment: 0-10 Pain Score: 2  (up to 5/10 during activity)    Home Living Family/patient expects to be discharged to:: Skilled nursing facility                      Prior Function Level of Independence: Independent with assistive device(s)         Comments: Pt is able to walk down to dining area with walker, out in the community some     Hand Dominance        Extremity/Trunk Assessment  Upper Extremity Assessment Upper Extremity Assessment: Defer to OT evaluation (age appropriate deficits)    Lower Extremity Assessment Lower Extremity Assessment: Generalized weakness;LLE deficits/detail LLE Deficits / Details: Pt with expected post fx/sx pain and weakness in the L LE, though she showed good effort with testing and did have some AAROM in the hip       Communication   Communication: No difficulties  Cognition Arousal/Alertness: Awake/alert Behavior During Therapy: WFL for tasks assessed/performed Overall Cognitive Status: Within Functional Limits for tasks assessed                                        General Comments       Exercises Total Joint Exercises Ankle Circles/Pumps: AROM;10 reps Quad Sets: Strengthening;10 reps Gluteal Sets: Strengthening;10 reps Heel Slides: AAROM;5 reps Hip ABduction/ADduction: AAROM;5 reps   Assessment/Plan    PT Assessment Patient needs continued PT services  PT Problem List Decreased strength;Decreased range of motion;Decreased activity tolerance;Decreased balance;Decreased mobility;Decreased coordination;Decreased knowledge of use of DME;Decreased safety awareness;Decreased knowledge of precautions;Pain;Cardiopulmonary status limiting activity       PT Treatment Interventions DME instruction;Gait training;Functional mobility training;Therapeutic activities;Therapeutic exercise;Balance training;Neuromuscular re-education;Cognitive remediation;Patient/family education    PT Goals (Current goals can be found in the Care Plan section)  Acute Rehab PT Goals Patient Stated Goal: get back to walking well PT Goal Formulation: With patient Time For Goal Achievement: 09/19/16 Potential to Achieve Goals: Fair    Frequency BID   Barriers to discharge        Co-evaluation               End of Session Equipment Utilized During Treatment: Gait belt;Oxygen Activity Tolerance: Patient limited by pain;Patient limited by fatigue Patient left: with chair alarm set;with call bell/phone within reach;with family/visitor present Nurse Communication: Mobility status PT Visit Diagnosis: Difficulty in walking, not elsewhere classified (R26.2);Muscle weakness (generalized) (M62.81)    Time: 1008-1040 PT Time Calculation (min) (ACUTE ONLY): 32 min   Charges:   PT Evaluation $PT Eval Low Complexity: 1 Procedure PT Treatments $Therapeutic Exercise: 8-22 mins   PT G Codes:        Kreg Shropshire, DPT 09/08/2016, 11:47 AM

## 2016-09-08 NOTE — Progress Notes (Signed)
Physical Therapy Treatment Patient Details Name: Jacqueline Sanchez MRN: [id number] DOB: 10/13/1919 Today's Date: 09/08/2016    History of Present Illness 83 y.o. female s/p Hemiarthroplasty repair of a Left Femur Fracture following a fall when making her bed. Pt. PMHx includes: Asthma, CKD 3, GERD, HTN, Hyperlidiemia, Osteoporosis, and TIA.    PT Comments    Pt showed good effort with all tasks but is considerable limited with mobility and general functional activities.  Pt struggled to get to either LE moving in standing and was heavily reliant on the walker and needing constant assist from the PT to stay upright and unweight L>R LE to take even very small, cautious steps.     Follow Up Recommendations  SNF     Equipment Recommendations       Recommendations for Other Services       Precautions / Restrictions Precautions Precautions: Posterior Hip Restrictions Weight Bearing Restrictions: Yes LLE Weight Bearing: Weight bearing as tolerated    Mobility  Bed Mobility Overal bed mobility: Needs Assistance Bed Mobility: Sit to Supine     Supine to sit: Mod assist Sit to supine: Max assist;Mod assist   General bed mobility comments: Pt unable to lift LEs up into bed, needed significant assist in doing this  Transfers Overall transfer level: Needs assistance Equipment used: Rolling walker (2 wheeled) Transfers: Sit to/from Stand Sit to Stand: Mod assist;Max assist         General transfer comment: Pt had a harder time getting to standing from lower recliner height.  She was able to initiate some lift, but ultimately needed heavy assist to get to fully upright and keep weight forward on the walker  Ambulation/Gait Ambulation/Gait assistance: Mod assist Ambulation Distance (Feet): 3 Feet Assistive device: Rolling walker (2 wheeled)       General Gait Details: Again very limited ability to take steps, shift weight, etc with "ambulation"  She was able to take some very  small shuffling steps, but needed PT to help unweight L LE in order to move R LE more than 1-2 cm at a time.    Stairs            Wheelchair Mobility    Modified Rankin (Stroke Patients Only)       Balance Overall balance assessment: Needs assistance Sitting-balance support: Bilateral upper extremity supported Sitting balance-Leahy Scale: Fair     Standing balance support: Bilateral upper extremity supported Standing balance-Leahy Scale: Poor                              Cognition Arousal/Alertness: Awake/alert Behavior During Therapy: WFL for tasks assessed/performed Overall Cognitive Status: Within Functional Limits for tasks assessed                                        Exercises Total Joint Exercises Ankle Circles/Pumps: AROM;10 reps Quad Sets: Strengthening;10 reps Gluteal Sets: Strengthening;10 reps Short Arc Quad: AROM;10 reps;AAROM Heel Slides: AAROM;10 reps Hip ABduction/ADduction: AAROM;10 reps    General Comments        Pertinent Vitals/Pain Pain Assessment: 0-10 Pain Score: 8  Pain Descriptors / Indicators: Aching Pain Intervention(s): Limited activity within patient's tolerance;Monitored during session    Home Living Family/patient expects to be discharged to:: Skilled nursing facility Living Arrangements: Alone Available Help at Discharge: Available 24 hours/day Type of Home:  Assisted living [facility]) Home Access: Level entry     Home Equipment: Walker - 2 wheels;Cane - single point;Grab bars - toilet;Grab bars - tub/shower;Hand held shower head      Prior Function Level of Independence: Independent with assistive device(s)      Comments: Pt. ate 2 meals a day in a common dining area. Pt. independently fixed a light breakfast in her room. Facility assists with meds.   PT Goals (current goals can now be found in the care plan section) Acute Rehab PT Goals Patient Stated Goal: To return to [facility]. PT Goal Formulation: With patient Time For Goal Achievement: 09/19/16 Potential to Achieve Goals: Fair Progress towards PT goals: Progressing toward goals    Frequency    BID      PT Plan Current plan remains appropriate    Co-evaluation             End of Session Equipment Utilized During Treatment: Gait belt;Oxygen Activity Tolerance: Patient limited by pain;Patient limited by fatigue Patient left: with chair alarm set;with call bell/phone within reach;with family/visitor present Nurse Communication: Mobility status PT Visit Diagnosis: Difficulty in walking, not elsewhere classified (R26.2);Muscle weakness (generalized) (M62.81)     Time: 6203-5597 PT Time Calculation (min) (ACUTE ONLY): 25 min  Charges:  $Gait Training: 8-22 mins $Therapeutic Exercise: 8-22 mins                    G Codes:       Kreg Shropshire, DPT 09/08/2016, 2:57 PM

## 2016-09-08 NOTE — Evaluation (Signed)
Occupational Therapy Evaluation Patient Details Name: Jacqueline Sanchez MRN: [id number] DOB: 1920/03/21 Today's Date: 09/08/2016    History of Present Illness Pt. is a 83 y.o. female who was admitted to Hemiarthroplasty repair of a Left Femur Fracture following a fall when making her bed. Pt. PMHx includes: Asthma, CKD 3, GERD, HTN, Hyperlidiemia, Osteoporosis, and TIA.   Clinical Impression   Pt. is a 83 y.o. female who was admitted to [facility] for a Hemiarthroplasty repair of a Left Hip Fracture following a fall while making her bed. Pt. presents with weakness, pain, and impaired functional mobility which hinder her ability to complete ADL, and IADL tasks. Pt. SO2 was 98% on 2.5LO2. Pt. requires visual demonstration,verbal cues, and tactile cues for assist with A/E use for LE dressing. Pt. Education was provided about A/E use for LE ADLs. Pt. Could benefit from skilled OT services for ADL training, A/E training, UE there. Ex, functional mobility, energy conservation, and work simplification skills in order to work towards retrurning to her PLOF at ALF level of care.    Follow Up Recommendations  SNF    Equipment Recommendations       Recommendations for Other Services       Precautions / Restrictions Precautions Precautions: Posterior Hip Restrictions Weight Bearing Restrictions: Yes LLE Weight Bearing: Weight bearing as tolerated         Balance Overall balance assessment: Needs assistance Sitting-balance support: Bilateral upper extremity supported Sitting balance-Leahy Scale: Fair     Standing balance support: Bilateral upper extremity supported Standing balance-Leahy Scale: Poor                             ADL either performed or assessed with clinical judgement   ADL Overall ADL's : Needs assistance/impaired Eating/Feeding: Set up;Independent   Grooming: Independent;Set up;Sitting       Lower Body Bathing: Maximal assistance       Lower Body  Dressing: Maximal assistance               Functional mobility during ADLs: Moderate assistance General ADL Comments: Pt. education was provided about A/E use for LE ADLs.     Vision  Wears glasses, No change from baseline       Perception     Praxis      Pertinent Vitals/Pain Pain Assessment: 0-10 Pain Score: 8  Pain Descriptors / Indicators: Aching Pain Intervention(s): Limited activity within patient's tolerance;Monitored during session     Hand Dominance Right   Extremity/Trunk Assessment Upper Extremity Assessment Upper Extremity Assessment: Generalized weakness (Pt. has a history of right rotator cuff injury.)     Communication Communication Communication: No difficulties   Cognition Arousal/Alertness: Awake/alert Behavior During Therapy: WFL for tasks assessed/performed Overall Cognitive Status: Within Functional Limits for tasks assessed                                     General Comments       Exercises   Shoulder Instructions      Home Living Family/patient expects to be discharged to:: Skilled nursing facility Living Arrangements: Alone Available Help at Discharge: Available 24 hours/day Type of Home: Assisted living [facility]) Home Access: Level entry           Bathroom Shower/Tub: Walk-in shower         Home Equipment: Environmental consultant - 2 wheels;Cane - single  point;Grab bars - toilet;Grab bars - tub/shower;Hand held shower head          Prior Functioning/Environment Level of Independence: Independent with assistive device(s) with basic ADL care, and light homemaking in her room.       Comments: Pt. ate 2 meals a day in a common dining area. Pt. independently fixed a light breakfast in her room. Facility assists with meds. Required assist with showers.        OT Problem List: Decreased strength;Decreased activity tolerance;Decreased knowledge of use of DME or AE      OT Treatment/Interventions: Self-care/ADL  training;Therapeutic exercise;Patient/family education;Therapeutic activities;Energy conservation;DME and/or AE instruction    OT Goals(Current goals can be found in the care plan section) Acute Rehab OT Goals Patient Stated Goal: To return to [facility]. OT Goal Formulation: With patient Potential to Achieve Goals: Good  OT Frequency: Min 1X/week   Barriers to D/C:            Co-evaluation              End of Session    Activity Tolerance: Patient tolerated treatment well Patient left: in chair;with call bell/phone within reach;with chair alarm set  OT Visit Diagnosis: Muscle weakness (generalized) (M62.81)                Time: 1030-1314 OT Time Calculation (min): 27 min Charges:  OT General Charges $OT Visit: 1 Procedure OT Evaluation $OT Eval Moderate Complexity: 1 Procedure G-Codes:     Harrel Carina, MS, OTR/L  Harrel Carina, MS, OTR/L 09/08/2016, 12:08 PM

## 2016-09-08 NOTE — Progress Notes (Signed)
[place] [facility] [place] NAME: Jacqueline Sanchez    MR#:  [id number]  DATE OF BIRTH:  [birth year]  SUBJECTIVE:   Patient here after a fall and noted to have a left hip fracture. Status post left hip hemiarthroplasty postop day #1. Sitting at bedside, complaining of some left hip pain but no other complaints.  REVIEW OF SYSTEMS:    Review of Systems  Constitutional: Negative for chills and fever.  HENT: Negative for congestion and tinnitus.   Eyes: Negative for blurred vision and double vision.  Respiratory: Negative for cough, shortness of breath and wheezing.   Cardiovascular: Negative for chest pain, orthopnea and PND.  Gastrointestinal: Negative for abdominal pain, diarrhea, nausea and vomiting.  Genitourinary: Negative for dysuria and hematuria.  Neurological: Negative for dizziness, sensory change and focal weakness.  All other systems reviewed and are negative.   Nutrition: Regular Tolerating Diet: Yes Tolerating PT: Yes     DRUG ALLERGIES:   Allergies  Allergen Reactions  . Morphine And Related Nausea Only    VITALS:  Blood pressure (!) 147/53, pulse 94, temperature 98.9 F (37.2 C), temperature source Oral, resp. rate 18, height 5\' 4"  (1.626 m), weight 63 kg (139 lb), SpO2 97 %.  PHYSICAL EXAMINATION:   Physical Exam  GENERAL:  83 y.o.-year-old patient sitting up in chair in no acute distress.  EYES: Pupils equal, round, reactive to light and accommodation. No scleral icterus. Extraocular muscles intact.  HEENT: Head atraumatic, normocephalic. Oropharynx and nasopharynx clear.  NECK:  Supple, no jugular venous distention. No thyroid enlargement, no tenderness.  LUNGS: Normal breath sounds bilaterally, no wheezing, rales, rhonchi. No use of accessory muscles of respiration.  CARDIOVASCULAR: S1, S2 normal. No murmurs, rubs, or gallops.  ABDOMEN: Soft, nontender, nondistended. Bowel sounds present. No organomegaly or mass.   EXTREMITIES: No cyanosis, clubbing or edema b/l.   Left Hip dressing from hip surgery.  NEUROLOGIC: Cranial nerves II through XII are intact. No focal Motor or sensory deficits b/l.   PSYCHIATRIC: The patient is alert and oriented x 3.  SKIN: No obvious rash, lesion, or ulcer.    LABORATORY PANEL:   CBC  Recent Labs Lab 09/08/16 0502  WBC 8.6  HGB 9.2*  HCT 27.7*  PLT 208   ------------------------------------------------------------------------------------------------------------------  Chemistries   Recent Labs Lab 09/06/16 2338  09/08/16 0502  NA 136  < > 138  K 4.1  < > 3.9  CL 107  < > 108  CO2 20*  < > 26  GLUCOSE 125*  < > 123*  BUN 22*  < > 14  CREATININE 1.16*  < > 1.08*  CALCIUM 10.0  < > 9.1  AST 22  --   --   ALT 17  --   --   ALKPHOS 74  --   --   BILITOT 0.8  --   --   < > = values in this interval not displayed. ------------------------------------------------------------------------------------------------------------------  Cardiac Enzymes  Recent Labs Lab 09/06/16 2338  TROPONINI <0.03   ------------------------------------------------------------------------------------------------------------------  RADIOLOGY:  Dg Chest 1 View  Result Date: 09/06/2016 CLINICAL DATA:  Fall.  Preoperative. EXAM: CHEST 1 VIEW COMPARISON:  05/30/2011 FINDINGS: Mild cardiac enlargement. Mild emphysematous changes in the lungs. No focal airspace disease or consolidation. Calcified granulomas in the right lung base. No blunting of costophrenic angles. No pneumothorax. Mediastinal contours appear intact. Calcified and tortuous aorta. Old left rib fractures. IMPRESSION: Emphysematous changes in the lungs. Cardiac enlargement.  No evidence of active pulmonary disease. Electronically Signed   By: Lucienne Capers M.D.   On: 09/06/2016 23:44   Dg Pelvis 1-2 Views  Result Date: 09/07/2016 CLINICAL DATA:  Patient undergoing left total hip arthroplasty. Intraoperative  image. EXAM: PELVIS - 1-2 VIEW COMPARISON:  2 single views of the left hip earlier today. FINDINGS: Reaming device is in place in the left femur. Single cerclage wire just proximal to the lesser trochanter is seen. The left femoral head has been removed. No acute abnormality is identified. IMPRESSION: Left hip replacement in progress.  No acute abnormality. Electronically Signed   By: Inge Rise M.D.   On: 09/07/2016 15:23   Dg Pelvis 1-2 Views  Result Date: 09/07/2016 CLINICAL DATA:  Intraoperative hardware evaluation. EXAM: PELVIS - 1-2 VIEW COMPARISON:  None. FINDINGS: Single portable x-ray of the pelvis is provided. Left hip arthroplasty with the femoral stem component identified with a cerclage wire at the level of the lesser trochanter. Femoral neck component protrudes through the collar or prominently compared with the prior exam. IMPRESSION: Intraoperative x-ray for arthroplasty placement. Electronically Signed   By: Kathreen Devoid   On: 09/07/2016 15:23   Dg Pelvis 1-2 Views  Result Date: 09/07/2016 CLINICAL DATA:  Intraoperative hardware check EXAM: PELVIS - 1-2 VIEW COMPARISON:  None. FINDINGS: Single portable x-ray of the pelvis is provided. Left hip arthroplasty with the femoral stem component partially visualize. Single cerclage wire is partially visualized at the level of the lesser trochanter. No other radiopaque foreign bodies. IMPRESSION: Intraoperative localization as above. Electronically Signed   By: Kathreen Devoid   On: 09/07/2016 15:21   Dg Knee 1-2 Views Left  Result Date: 09/07/2016 CLINICAL DATA:  Left knee pain since a fall yesterday. Initial encounter. EXAM: LEFT KNEE - 1-2 VIEW COMPARISON:  None. FINDINGS: There is no acute bony or joint abnormality. Mild patellofemoral degenerative change is noted. Atherosclerotic calcifications are seen. No joint effusion. IMPRESSION: No acute abnormality. Mild patellofemoral degenerative disease. Atherosclerosis. Electronically Signed    By: Inge Rise M.D.   On: 09/07/2016 17:07   Ct Head Wo Contrast  Result Date: 09/06/2016 CLINICAL DATA:  Mechanical fall today. No loss of consciousness. Laceration to the left eye. EXAM: CT HEAD WITHOUT CONTRAST TECHNIQUE: Contiguous axial images were obtained from the base of the skull through the vertex without intravenous contrast. COMPARISON:  MRI brain 04/20/2016.  CT head 04/20/2016. FINDINGS: Brain: Well defined cystic lesion in the right frontal lobe measuring 3.2 x 3.9 cm in diameter. Mild calcification. This is unchanged since previous study. Mild diffuse cerebral atrophy. Mild ventricular dilatation consistent with central atrophy. No mass effect or midline shift. No abnormal extra-axial fluid collections. Gray-white matter junctions are distinct. Basal cisterns are not effaced. No acute intracranial hemorrhage. Vascular: Vascular calcifications are present. Skull: Calvarium appears intact. Sinuses/Orbits: Paranasal sinuses and mastoid air cells are clear. Other: No significant changes since previous study. IMPRESSION: No acute intracranial abnormalities. Chronic atrophy. Cystic lesion in the right anterior frontal lobe without change since previous study, likely benign. Electronically Signed   By: Lucienne Capers M.D.   On: 09/06/2016 23:05   Ct Hip Left Wo Contrast  Result Date: 09/07/2016 CLINICAL DATA:  Left hip fracture due to a fall yesterday. Treatment planning study. Initial encounter. EXAM: CT OF THE LEFT HIP WITHOUT CONTRAST TECHNIQUE: Multidetector CT imaging of the left hip was performed according to the standard protocol. Multiplanar CT image reconstructions were also generated. COMPARISON:  Plain films left  hip 09/06/2016. FINDINGS: Bones/Joint/Cartilage The patient has an acute fracture of the left hip. The fracture extends from the subcapital aspect of the left hip superiorly in an inferior orientation through the base of the neck. There is no trochanteric extension. The  femoral head remains located. No evidence of pathologic fracture is identified. Degenerative change about the symphysis pubis is noted. Ligaments Suboptimally assessed by CT. Muscles and Tendons Intact. Soft tissues Atherosclerosis is seen. Mild infiltration of subcutaneous fat over the lateral aspect of the left hip is consistent with contusion. IMPRESSION: Left hip fracture extends from the subcapital aspect of the hip superiorly to the base of the femoral neck of inferiorly. The greater and lesser trochanters and trochanteric ridge are spared. Electronically Signed   By: Inge Rise M.D.   On: 09/07/2016 10:53   Ct 3d Recon At Scanner  Result Date: 09/07/2016 CLINICAL DATA:  Nonspecific (abnormal) findings on radiological and other examination of musculoskeletal system. Preoperative planning study for patient with a left hip fracture suffered in a fall 09/06/2016. Initial encounter. EXAM: 3-DIMENSIONAL CT IMAGE RENDERING ON ACQUISITION WORKSTATION TECHNIQUE: 3-dimensional CT images were rendered by post-processing of the original CT data on an acquisition workstation. The 3-dimensional CT images were interpreted and findings were reported in the accompanying complete CT report for this study COMPARISON:  Plain films of the left hip 09/06/2016. FINDINGS: Surface rendered 3D imaging confirms finding dictated under accession [id number]. IMPRESSION: As above. Electronically Signed   By: Inge Rise M.D.   On: 09/07/2016 11:46   Dg Foot 2 Views Left  Result Date: 09/07/2016 CLINICAL DATA:  Left foot pain since a fall yesterday. Initial encounter. EXAM: LEFT FOOT - 2 VIEW COMPARISON:  Plain films left foot 09/06/2016. FINDINGS: Soft tissue swelling is seen over the dorsum of the foot as on yesterday's examination. No acute bony or joint abnormality is identified. Bones are osteopenic. No evidence of arthropathy. IMPRESSION: Soft tissue swelling without underlying acute bony or joint abnormality.  Osteopenia. Electronically Signed   By: Inge Rise M.D.   On: 09/07/2016 17:06   Dg Foot Complete Left  Result Date: 09/06/2016 CLINICAL DATA:  Left foot pain and bruising after a fall. EXAM: LEFT FOOT - COMPLETE 3+ VIEW COMPARISON:  None. FINDINGS: Diffuse bone demineralization. No evidence of acute fracture or dislocation. Soft tissue swelling over the dorsum of the foot. No destructive or expansile bone lesions are appreciated. IMPRESSION: Diffuse bone demineralization. Dorsal soft tissue swelling. No acute fractures identified. Electronically Signed   By: Lucienne Capers M.D.   On: 09/06/2016 23:42   Dg Hip Port Unilat With Pelvis 1v Left  Result Date: 09/07/2016 CLINICAL DATA:  Status post left hip replacement due to a fracture suffered in a fall yesterday. Postoperative imaging. Initial encounter. EXAM: DG HIP (WITH OR WITHOUT PELVIS) 1V PORT LEFT COMPARISON:  Plain films left hip 09/06/2016. FINDINGS: Bipolar left hip hemiarthroplasty is in place with a single cerclage wire about the proximal femur. The device is located. No acute fracture is seen. Gas in the soft tissues from surgery and surgical staples are noted. IMPRESSION: Status post left hip replacement.  No acute abnormality. Electronically Signed   By: Inge Rise M.D.   On: 09/07/2016 17:08   Dg Hip Unilat W Or Wo Pelvis 2-3 Views Left  Result Date: 09/06/2016 CLINICAL DATA:  Left hip pain after fall. EXAM: DG HIP (WITH OR WITHOUT PELVIS) 2-3V LEFT COMPARISON:  None. FINDINGS: Diffuse bone demineralization. There is a transverse fracture  at the base of the left femoral neck with probable focal extension into the lesser trochanter. There is varus angulation of the hip with impaction of fracture fragments and mild anterior displacement of the distal fracture fragment with posterior rotation of the femoral head. No dislocation at the hip joint. The pelvis appears intact. Degenerative changes in the lower lumbar spine and both  hips. Post kyphoplasty changes in the lower lumbar spine. Vascular calcifications are present. IMPRESSION: Acute transverse fracture of the base of the left femoral neck with probable extension into the lesser trochanteric region. Varus angulation of the fracture fragments. Electronically Signed   By: Lucienne Capers M.D.   On: 09/06/2016 23:41     ASSESSMENT AND PLAN:   83 year old female with past medical history of acute process, hypertension, hyperlipidemia, GERD, who presented to the hospital with a fall and noted to have Left hip fracture.   1. Status post fall and left hip fracture-status post left hip arthroplasty postop day #1. -Continue pain control and physical therapy as tolerated. Likely discharge to rehabilitation next 1-2 days.  2. Essential hypertension-continue Norvasc, losartan, Metoprolol, Hydralazine  3. Osteoporosis - cont. Vit. D supplements.   4. Hyperlipidemia - cont. ATorvastatin.   All the records are reviewed and case discussed with Care Management/Social Worker. Management plans discussed with the patient, family and they are in agreement.  CODE STATUS: DO NOT RESUSCITATE  DVT Prophylaxis: Lovenox  TOTAL TIME TAKING CARE OF THIS PATIENT: 25 minutes.   POSSIBLE D/C IN 1-2 DAYS, DEPENDING ON CLINICAL CONDITION.   Henreitta Leber M.D on 09/08/2016 at 3:49 PM  Between 7am to 6pm - Pager - [phone]  After 6pm go to [email or web address] - Proofreader  [facility] [place] [facility]  Office  [phone]  CC: Primary care physician; Kirk Ruths., MD

## 2016-09-09 LAB — BASIC METABOLIC PANEL
Anion gap: 5 (ref 5–15)
BUN: 21 mg/dL — AB (ref 6–20)
CHLORIDE: 104 mmol/L (ref 101–111)
CO2: 26 mmol/L (ref 22–32)
CREATININE: 1.24 mg/dL — AB (ref 0.44–1.00)
Calcium: 9.1 mg/dL (ref 8.9–10.3)
GFR calc Af Amer: 42 mL/min — ABNORMAL LOW (ref >60)
GFR calc non Af Amer: 36 mL/min — ABNORMAL LOW (ref >60)
GLUCOSE: 102 mg/dL — AB (ref 65–99)
Potassium: 4.2 mmol/L (ref 3.5–5.1)
Sodium: 135 mmol/L (ref 135–145)

## 2016-09-09 LAB — CBC
HEMATOCRIT: 25.4 % — AB (ref 35.0–47.0)
HEMOGLOBIN: 8.7 g/dL — AB (ref 12.0–16.0)
MCH: 33.7 pg (ref 26.0–34.0)
MCHC: 34 g/dL (ref 32.0–36.0)
MCV: 99 fL (ref 80.0–100.0)
Platelets: 178 10*3/uL (ref 150–440)
RBC: 2.57 MIL/uL — ABNORMAL LOW (ref 3.80–5.20)
RDW: 13.3 % (ref 11.5–14.5)
WBC: 11.2 10*3/uL — ABNORMAL HIGH (ref 3.6–11.0)

## 2016-09-09 LAB — SURGICAL PATHOLOGY

## 2016-09-09 MED ORDER — ASPIRIN EC 81 MG PO TBEC
81.0000 mg | DELAYED_RELEASE_TABLET | Freq: Every day | ORAL | Status: DC
  Administered 2016-09-09 – 2016-09-10 (×2): 81 mg via ORAL
  Filled 2016-09-09 (×2): qty 1

## 2016-09-09 MED ORDER — OXYCODONE-ACETAMINOPHEN 5-325 MG PO TABS: 1.0000 | ORAL_TABLET | Freq: Four times a day (QID) | ORAL | 0 refills | Status: DC | PRN

## 2016-09-09 MED ORDER — ENOXAPARIN SODIUM 30 MG/0.3ML ~~LOC~~ SOLN: 30.0000 mg | SUBCUTANEOUS | Status: DC

## 2016-09-09 NOTE — Discharge Summary (Addendum)
[facility] [place] NAME: Jacqueline Sanchez    MR#:  [id number]  DATE OF BIRTH:  10/27/1919  DATE OF ADMISSION:  09/06/2016 ADMITTING PHYSICIAN: Harvie Bridge, DO  DATE OF DISCHARGE: 09/10/16  PRIMARY CARE PHYSICIAN: Kirk Ruths., MD    ADMISSION DIAGNOSIS:  Left leg pain [M79.605] Fall, initial encounter [W19.XXXA] Closed fracture of neck of left femur, initial encounter ([place]) [S72.002A]  DISCHARGE DIAGNOSIS:  Active Problems:   Fracture of femoral neck, left ([place])   SECONDARY DIAGNOSIS:   Past Medical History:  Diagnosis Date  . Aphasia 04/2016   Transient---??TIA  . Asthma   . CKD (chronic kidney disease)   . Edema   . Femur fracture, left ([place]) 09/06/2016  . GERD (gastroesophageal reflux disease)   . Hyperlipidemia   . Hypertension   . Osteoporosis     HOSPITAL COURSE:   83 year old female with past medical history of acute process, hypertension, hyperlipidemia, GERD, who presented to the hospital with a fall and noted to have Left hip fracture.   1. Status post fall and left hip fracture-status post left hip arthroplasty postop day # 3. - doing well post-op and pain well controlled and tolerating PT.  Follow up with Ortho as outpatient.  - d/c to SNF for ongoing rehab.  - Lovenox for DVT Prophylaxis.   2. Essential hypertension-continue Norvasc, losartan, Metoprolol, Hydralazine - BP stable while in hospital.   3. Osteoporosis - cont. Vit. D supplements.   4. Hyperlipidemia - cont. ATorvastatin.   5. Anemia - acute blood loss anemia due to recent Hip Surgery. No acute indication for transfusion.  - hemodynamically stable. Follow Hg. As outpatient.   DISCHARGE CONDITIONS:   Stable.   CONSULTS OBTAINED:  Treatment Team:  Thornton Park, MD  DRUG ALLERGIES:   Allergies  Allergen Reactions  . Morphine And Related Nausea Only    DISCHARGE MEDICATIONS:   Allergies as of 09/09/2016       Reactions   Morphine And Related Nausea Only      Medication List    TAKE these medications   amLODipine 10 MG tablet Commonly known as:  NORVASC Take 1 tablet (10 mg total) by mouth daily.   aspirin EC 81 MG tablet Take 81 mg by mouth every other day.   atorvastatin 80 MG tablet Commonly known as:  LIPITOR Take 1 tablet by mouth daily.   enoxaparin 30 MG/0.3ML injection Commonly known as:  LOVENOX Inject 0.3 mLs (30 mg total) into the skin daily. Start taking on:  09/10/2016   hydrALAZINE 25 MG tablet Commonly known as:  APRESOLINE Take 1 tablet (25 mg total) by mouth 4 (four) times daily.   losartan 50 MG tablet Commonly known as:  COZAAR Take 1 tablet by mouth daily.   Melatonin 3 MG Tabs Take 1 tablet by mouth at bedtime.   metoprolol succinate 50 MG 24 hr tablet Commonly known as:  TOPROL-XL Take 1 tablet (50 mg total) by mouth daily. Take with or immediately following a meal.   MULTI-VITAMINS Tabs Take 1 tablet by mouth daily.   oxyCODONE-acetaminophen 5-325 MG tablet Commonly known as:  PERCOCET/ROXICET Take 1 tablet by mouth every 6 (six) hours as needed for severe pain. And BID prn What changed:  when to take this  reasons to take this   polyethylene glycol packet Commonly known as:  MIRALAX / GLYCOLAX Take 17 g by mouth daily.   senna 8.6 MG tablet Commonly known as:  SENOKOT Take 2 tablets by mouth 2 (two) times daily.   vitamin B-12 1000 MCG tablet Commonly known as:  CYANOCOBALAMIN Take 1,000 mcg by mouth daily.   Vitamin D (Cholecalciferol) 1000 units Caps Take 1 capsule by mouth daily.         DISCHARGE INSTRUCTIONS:   DIET:  Cardiac diet  DISCHARGE CONDITION:  Stable  ACTIVITY:  Activity as tolerated  OXYGEN:  Home Oxygen: No.   Oxygen Delivery: room air  DISCHARGE LOCATION:  nursing home   If you experience worsening of your admission symptoms, develop shortness of breath, life threatening emergency, suicidal or  homicidal thoughts you must seek medical attention immediately by calling 911 or calling your MD immediately  if symptoms less severe.  You Must read complete instructions/literature along with all the possible adverse reactions/side effects for all the Medicines you take and that have been prescribed to you. Take any new Medicines after you have completely understood and accpet all the possible adverse reactions/side effects.   Please note  You were cared for by a hospitalist during your hospital stay. If you have any questions about your discharge medications or the care you received while you were in the hospital after you are discharged, you can call the unit and asked to speak with the hospitalist on call if the hospitalist that took care of you is not available. Once you are discharged, your primary care physician will handle any further medical issues. Please note that NO REFILLS for any discharge medications will be authorized once you are discharged, as it is imperative that you return to your primary care physician (or establish a relationship with a primary care physician if you do not have one) for your aftercare needs so that they can reassess your need for medications and monitor your lab values.     Today   No acute complaints presently and no events overnight.   VITAL SIGNS:  Blood pressure (!) 162/55, pulse (!) 105, temperature 98.4 F (36.9 C), temperature source Oral, resp. rate 18, height 5\' 4"  (1.626 m), weight 63 kg (139 lb), SpO2 95 %.  I/O:   Intake/Output Summary (Last 24 hours) at 09/09/16 1407 Last data filed at 09/09/16 1018  Gross per 24 hour  Intake           1032.5 ml  Output              250 ml  Net            782.5 ml    PHYSICAL EXAMINATION:   GENERAL:  83 y.o.-year-old patient sitting up in chair in no acute distress.  EYES: Pupils equal, round, reactive to light and accommodation. No scleral icterus. Extraocular muscles intact.  HEENT: Head  atraumatic, normocephalic. Oropharynx and nasopharynx clear.  NECK:  Supple, no jugular venous distention. No thyroid enlargement, no tenderness.  LUNGS: Normal breath sounds bilaterally, no wheezing, rales, rhonchi. No use of accessory muscles of respiration.  CARDIOVASCULAR: S1, S2 normal. No murmurs, rubs, or gallops.  ABDOMEN: Soft, nontender, nondistended. Bowel sounds present. No organomegaly or mass.  EXTREMITIES: No cyanosis, clubbing or edema b/l.   Left Hip dressing from hip surgery.  NEUROLOGIC: Cranial nerves II through XII are intact. No focal Motor or sensory deficits b/l.   PSYCHIATRIC: The patient is alert and oriented x 3.  SKIN: No obvious rash, lesion, or ulcer.   DATA REVIEW:   CBC  Recent Labs Lab 09/09/16 0420  WBC 11.2*  HGB 8.7*  HCT 25.4*  PLT 178    Chemistries   Recent Labs Lab 09/06/16 2338  09/09/16 0420  NA 136  < > 135  K 4.1  < > 4.2  CL 107  < > 104  CO2 20*  < > 26  GLUCOSE 125*  < > 102*  BUN 22*  < > 21*  CREATININE 1.16*  < > 1.24*  CALCIUM 10.0  < > 9.1  AST 22  --   --   ALT 17  --   --   ALKPHOS 74  --   --   BILITOT 0.8  --   --   < > = values in this interval not displayed.  Cardiac Enzymes  Recent Labs Lab 09/06/16 2338  TROPONINI <0.03    Microbiology Results  Results for orders placed or performed during the hospital encounter of 09/06/16  Surgical PCR screen     Status: None   Collection Time: 09/07/16  9:40 AM  Result Value Ref Range Status   MRSA, PCR NEGATIVE NEGATIVE Final   Staphylococcus aureus NEGATIVE NEGATIVE Final    Comment:        The Xpert SA Assay (FDA approved for NASAL specimens in patients over 6 years of age), is one component of a comprehensive surveillance program.  Test performance has been validated by [facility] for patients greater than or equal to 41 year old. It is not intended to diagnose infection nor to guide or monitor treatment.     RADIOLOGY:  Dg Pelvis 1-2  Views  Result Date: 09/07/2016 CLINICAL DATA:  Patient undergoing left total hip arthroplasty. Intraoperative image. EXAM: PELVIS - 1-2 VIEW COMPARISON:  2 single views of the left hip earlier today. FINDINGS: Reaming device is in place in the left femur. Single cerclage wire just proximal to the lesser trochanter is seen. The left femoral head has been removed. No acute abnormality is identified. IMPRESSION: Left hip replacement in progress.  No acute abnormality. Electronically Signed   By: Inge Rise M.D.   On: 09/07/2016 15:23   Dg Pelvis 1-2 Views  Result Date: 09/07/2016 CLINICAL DATA:  Intraoperative hardware evaluation. EXAM: PELVIS - 1-2 VIEW COMPARISON:  None. FINDINGS: Single portable x-ray of the pelvis is provided. Left hip arthroplasty with the femoral stem component identified with a cerclage wire at the level of the lesser trochanter. Femoral neck component protrudes through the collar or prominently compared with the prior exam. IMPRESSION: Intraoperative x-ray for arthroplasty placement. Electronically Signed   By: Kathreen Devoid   On: 09/07/2016 15:23   Dg Pelvis 1-2 Views  Result Date: 09/07/2016 CLINICAL DATA:  Intraoperative hardware check EXAM: PELVIS - 1-2 VIEW COMPARISON:  None. FINDINGS: Single portable x-ray of the pelvis is provided. Left hip arthroplasty with the femoral stem component partially visualize. Single cerclage wire is partially visualized at the level of the lesser trochanter. No other radiopaque foreign bodies. IMPRESSION: Intraoperative localization as above. Electronically Signed   By: Kathreen Devoid   On: 09/07/2016 15:21   Dg Knee 1-2 Views Left  Result Date: 09/07/2016 CLINICAL DATA:  Left knee pain since a fall yesterday. Initial encounter. EXAM: LEFT KNEE - 1-2 VIEW COMPARISON:  None. FINDINGS: There is no acute bony or joint abnormality. Mild patellofemoral degenerative change is noted. Atherosclerotic calcifications are seen. No joint effusion.  IMPRESSION: No acute abnormality. Mild patellofemoral degenerative disease. Atherosclerosis. Electronically Signed   By: Inge Rise M.D.   On: 09/07/2016 17:07  Dg Foot 2 Views Left  Result Date: 09/07/2016 CLINICAL DATA:  Left foot pain since a fall yesterday. Initial encounter. EXAM: LEFT FOOT - 2 VIEW COMPARISON:  Plain films left foot 09/06/2016. FINDINGS: Soft tissue swelling is seen over the dorsum of the foot as on yesterday's examination. No acute bony or joint abnormality is identified. Bones are osteopenic. No evidence of arthropathy. IMPRESSION: Soft tissue swelling without underlying acute bony or joint abnormality. Osteopenia. Electronically Signed   By: Inge Rise M.D.   On: 09/07/2016 17:06   Dg Hip Port Unilat With Pelvis 1v Left  Result Date: 09/07/2016 CLINICAL DATA:  Status post left hip replacement due to a fracture suffered in a fall yesterday. Postoperative imaging. Initial encounter. EXAM: DG HIP (WITH OR WITHOUT PELVIS) 1V PORT LEFT COMPARISON:  Plain films left hip 09/06/2016. FINDINGS: Bipolar left hip hemiarthroplasty is in place with a single cerclage wire about the proximal femur. The device is located. No acute fracture is seen. Gas in the soft tissues from surgery and surgical staples are noted. IMPRESSION: Status post left hip replacement.  No acute abnormality. Electronically Signed   By: Inge Rise M.D.   On: 09/07/2016 17:08      Management plans discussed with the patient, family and they are in agreement.  CODE STATUS:     Code Status Orders        Start     Ordered   09/07/16 0736  Do not attempt resuscitation (DNR)  Continuous    Question Answer Comment  In the event of cardiac or respiratory ARREST Do not call a "code blue"   In the event of cardiac or respiratory ARREST Do not perform Intubation, CPR, defibrillation or ACLS   In the event of cardiac or respiratory ARREST Use medication by any route, position, wound care, and other  measures to relive pain and suffering. May use oxygen, suction and manual treatment of airway obstruction as needed for comfort.      09/07/16 0736    Code Status History    Date Active Date Inactive Code Status Order ID Comments User Context   09/07/2016  3:17 AM 09/07/2016  7:36 AM DNR [id number]  Melvern Sample, RN Inpatient   09/07/2016  3:00 AM 09/07/2016  3:17 AM Full Code [id number]  Harvie Bridge, DO Inpatient   09/07/2016  2:59 AM 09/07/2016  3:00 AM DNR [id number]  Harvie Bridge, DO Inpatient   04/20/2016  6:54 PM 04/23/2016  4:13 PM Full Code [id number]  Lytle Butte, MD ED    Advance Directive Documentation     Most Recent Value  Type of Advance Directive  Out of facility DNR (pink MOST or yellow form)  Pre-existing out of facility DNR order (yellow form or pink MOST form)  Yellow form placed in chart (order not valid for inpatient use)  "MOST" Form in Place?  -      TOTAL TIME TAKING CARE OF THIS PATIENT: 40 minutes.    Henreitta Leber M.D on 09/09/2016 at 2:07 PM  Between 7am to 6pm - Pager - [phone]  After 6pm go to [email or web address] - Proofreader  [facility]  Office  [phone]  CC: Primary care physician; Kirk Ruths., MD

## 2016-09-09 NOTE — Plan of Care (Signed)
Problem: Safety: Goal: Ability to remain free from injury will improve Outcome: Progressing Bed in lowest position call bell within reach and bed alarm

## 2016-09-09 NOTE — Progress Notes (Signed)
Subjective: 2 Days Post-Op Procedure(s) (LRB): ARTHROPLASTY BIPOLAR HIP (HEMIARTHROPLASTY) (Left)    Patient reports pain as mild.  Objective:   VITALS:   Vitals:   09/09/16 0500 09/09/16 0751  BP:  (!) 162/55  Pulse:  (!) 105  Resp:  18  Temp: 98.4 F (36.9 C) 98.4 F (36.9 C)    Neurologically intact ABD soft Neurovascular intact Sensation intact distally Intact pulses distally Dorsiflexion/Plantar flexion intact Incision: no drainage  LABS  Recent Labs  09/07/16 0523 09/08/16 0502 09/09/16 0420  HGB 11.4* 9.2* 8.7*  HCT 34.0* 27.7* 25.4*  WBC 12.9* 8.6 11.2*  PLT 272 208 178     Recent Labs  09/07/16 0523 09/08/16 0502 09/09/16 0420  NA 137 138 135  K 3.9 3.9 4.2  BUN 19 14 21*  CREATININE 1.06* 1.08* 1.24*  GLUCOSE 133* 123* 102*     Recent Labs  09/07/16 0523  INR 0.87     Assessment/Plan: 2 Days Post-Op Procedure(s) (LRB): ARTHROPLASTY BIPOLAR HIP (HEMIARTHROPLASTY) (Left)   Advance diet Up with therapy D/C IV fluids Discharge to SNF

## 2016-09-09 NOTE — Progress Notes (Signed)
Plan is for patient to D/C to [facility] tomorrow pending medical clearance. Per Seth Bake admissions coordinator at [facility] patient can come to room 219. Clinical Education officer, museum (CSW) sent D/C summary to [facility] today via [place]. Clinical Social Worker (CSW) will continue to follow and assist as needed.   [employer], LCSW [phone]

## 2016-09-09 NOTE — Progress Notes (Signed)
Physical Therapy Treatment Patient Details Name: Jacqueline Sanchez MRN: [id number] DOB: 01/24/1920 Today's Date: 09/09/2016    History of Present Illness 83 y.o. female s/p Hemiarthroplasty repair of a Left Femur Fracture following a fall when making her bed. Pt. PMHx includes: Asthma, CKD 3, GERD, HTN, Hyperlidiemia, Osteoporosis, and TIA.    PT Comments    Pt did better with ambulation today and was able to walk ~10 ft w/o direct assist to keep her weight forward and use the walker appropriate.  She was still hesitant to fully trust L LE but had no buckling and managed to get R foot in front of L with a few strides.  Overall pt is still quite limited but did show improvement with walking, pt still unable to do much bed mobility w/o heavy assist.  Pt still having some difficulty following/processing instructions.    Follow Up Recommendations  SNF     Equipment Recommendations       Recommendations for Other Services       Precautions / Restrictions Precautions Precautions: Posterior Hip Restrictions LLE Weight Bearing: Weight bearing as tolerated    Mobility  Bed Mobility Overal bed mobility: Needs Assistance Bed Mobility: Sit to Supine     Supine to sit: Max assist;Mod assist     General bed mobility comments: Pt needed heavy cuing to initiate even minimal effort getting to EOB, needed heavy assist to get to sitting  Transfers Overall transfer level: Needs assistance Equipment used: Rolling walker (2 wheeled) Transfers: Sit to/from Stand Sit to Stand: Mod assist         General transfer comment: Pt seemed better able to assist with UEs and use walker to maintain balance, but still needed considerable help to shift weight forward  Ambulation/Gait Ambulation/Gait assistance: Min assist;Min guard Ambulation Distance (Feet): 20 Feet Assistive device: Rolling walker (2 wheeled)       General Gait Details: Pt initially needing help to unweight pressure during R  step (L WBing) but after a few steps and some heavy cuing/encouragement she was able to take a few steps with only CGA and despite being slow and hesitant did actually manage to walk a few feet w/o direct assist   Stairs            Wheelchair Mobility    Modified Rankin (Stroke Patients Only)       Balance Overall balance assessment: Needs assistance Sitting-balance support: Bilateral upper extremity supported Sitting balance-Leahy Scale: Fair     Standing balance support: Bilateral upper extremity supported Standing balance-Leahy Scale: Fair                              Cognition Arousal/Alertness: Awake/alert Behavior During Therapy: WFL for tasks assessed/performed Overall Cognitive Status: Difficult to assess                                 General Comments: Pt very slow to follow instructions, needing multiple bouts fo cuing, unsure how much was hearing issues but appeared to have some processing limitations that were not as prevelant yesterday      Exercises Total Joint Exercises Ankle Circles/Pumps: AROM;10 reps Quad Sets: Strengthening;15 reps Gluteal Sets: Strengthening;15 reps Short Arc Quad: AROM;10 reps;AAROM Heel Slides: AAROM;10 reps Hip ABduction/ADduction: AAROM;10 reps    General Comments        Pertinent Vitals/Pain Pain Score: 6  Home Living                      Prior Function            PT Goals (current goals can now be found in the care plan section) Progress towards PT goals: Progressing toward goals    Frequency    BID      PT Plan Current plan remains appropriate    Co-evaluation             End of Session Equipment Utilized During Treatment: Gait belt;Oxygen Activity Tolerance: Patient limited by pain;Patient limited by fatigue Patient left: with chair alarm set;with call bell/phone within reach;with family/visitor present   PT Visit Diagnosis: Difficulty in walking, not  elsewhere classified (R26.2);Muscle weakness (generalized) (M62.81)     Time: 8022-3361 PT Time Calculation (min) (ACUTE ONLY): 28 min  Charges:  $Gait Training: 8-22 mins $Therapeutic Exercise: 8-22 mins                    G Codes:       Kreg Shropshire, DPT 09/09/2016, 11:26 AM

## 2016-09-09 NOTE — Progress Notes (Signed)
OT Cancellation Note  Patient Details Name: Jacqueline Sanchez MRN: [id number] DOB: 01/01/1920   Cancelled Treatment:    Reason Eval/Treat Not Completed: Patient declined, no reason specified   Attempted to see patient for OT session but she was on bedpan and just finished with PT and was fatigued.  Will continue OT tomorrow.  Chrys Racer, OTR/L ascom [phone] 09/09/16, 2:41 PM

## 2016-09-09 NOTE — Progress Notes (Signed)
[facility] NAME: Drishti Pepperman    MR#:  [id number]  DATE OF BIRTH:  03/19/1920  SUBJECTIVE:   Patient here after a fall and noted to have a left hip fracture. Status post left hip hemiarthroplasty postop day #2.  Sitting up in the chair with some mild left hip pain but tolerating PT well. No other acute events overnight.  REVIEW OF SYSTEMS:    Review of Systems  Constitutional: Negative for chills and fever.  HENT: Negative for congestion and tinnitus.   Eyes: Negative for blurred vision and double vision.  Respiratory: Negative for cough, shortness of breath and wheezing.   Cardiovascular: Negative for chest pain, orthopnea and PND.  Gastrointestinal: Negative for abdominal pain, diarrhea, nausea and vomiting.  Genitourinary: Negative for dysuria and hematuria.  Neurological: Negative for dizziness, sensory change and focal weakness.  All other systems reviewed and are negative.   Nutrition: Regular Tolerating Diet: Yes Tolerating PT: Yes     DRUG ALLERGIES:   Allergies  Allergen Reactions  . Morphine And Related Nausea Only    VITALS:  Blood pressure (!) 162/55, pulse (!) 105, temperature 98.4 F (36.9 C), temperature source Oral, resp. rate 18, height 5\' 4"  (1.626 m), weight 63 kg (139 lb), SpO2 95 %.  PHYSICAL EXAMINATION:   Physical Exam  GENERAL:  83 y.o.-year-old patient sitting up in chair in no acute distress.  EYES: Pupils equal, round, reactive to light and accommodation. No scleral icterus. Extraocular muscles intact.  HEENT: Head atraumatic, normocephalic. Oropharynx and nasopharynx clear.  NECK:  Supple, no jugular venous distention. No thyroid enlargement, no tenderness.  LUNGS: Normal breath sounds bilaterally, no wheezing, rales, rhonchi. No use of accessory muscles of respiration.  CARDIOVASCULAR: S1, S2 normal. No murmurs, rubs, or gallops.  ABDOMEN: Soft, nontender, nondistended. Bowel  sounds present. No organomegaly or mass.  EXTREMITIES: No cyanosis, clubbing or edema b/l.   Left Hip dressing from hip surgery.  NEUROLOGIC: Cranial nerves II through XII are intact. No focal Motor or sensory deficits b/l.   PSYCHIATRIC: The patient is alert and oriented x 3.  SKIN: No obvious rash, lesion, or ulcer.    LABORATORY PANEL:   CBC  Recent Labs Lab 09/09/16 0420  WBC 11.2*  HGB 8.7*  HCT 25.4*  PLT 178   ------------------------------------------------------------------------------------------------------------------  Chemistries   Recent Labs Lab 09/06/16 2338  09/09/16 0420  NA 136  < > 135  K 4.1  < > 4.2  CL 107  < > 104  CO2 20*  < > 26  GLUCOSE 125*  < > 102*  BUN 22*  < > 21*  CREATININE 1.16*  < > 1.24*  CALCIUM 10.0  < > 9.1  AST 22  --   --   ALT 17  --   --   ALKPHOS 74  --   --   BILITOT 0.8  --   --   < > = values in this interval not displayed. ------------------------------------------------------------------------------------------------------------------  Cardiac Enzymes  Recent Labs Lab 09/06/16 2338  TROPONINI <0.03   ------------------------------------------------------------------------------------------------------------------  RADIOLOGY:  Dg Pelvis 1-2 Views  Result Date: 09/07/2016 CLINICAL DATA:  Patient undergoing left total hip arthroplasty. Intraoperative image. EXAM: PELVIS - 1-2 VIEW COMPARISON:  2 single views of the left hip earlier today. FINDINGS: Reaming device is in place in the left femur. Single cerclage wire just proximal to the lesser trochanter is seen. The left femoral head has been  removed. No acute abnormality is identified. IMPRESSION: Left hip replacement in progress.  No acute abnormality. Electronically Signed   By: Inge Rise M.D.   On: 09/07/2016 15:23   Dg Pelvis 1-2 Views  Result Date: 09/07/2016 CLINICAL DATA:  Intraoperative hardware evaluation. EXAM: PELVIS - 1-2 VIEW COMPARISON:   None. FINDINGS: Single portable x-ray of the pelvis is provided. Left hip arthroplasty with the femoral stem component identified with a cerclage wire at the level of the lesser trochanter. Femoral neck component protrudes through the collar or prominently compared with the prior exam. IMPRESSION: Intraoperative x-ray for arthroplasty placement. Electronically Signed   By: Kathreen Devoid   On: 09/07/2016 15:23   Dg Pelvis 1-2 Views  Result Date: 09/07/2016 CLINICAL DATA:  Intraoperative hardware check EXAM: PELVIS - 1-2 VIEW COMPARISON:  None. FINDINGS: Single portable x-ray of the pelvis is provided. Left hip arthroplasty with the femoral stem component partially visualize. Single cerclage wire is partially visualized at the level of the lesser trochanter. No other radiopaque foreign bodies. IMPRESSION: Intraoperative localization as above. Electronically Signed   By: Kathreen Devoid   On: 09/07/2016 15:21   Dg Knee 1-2 Views Left  Result Date: 09/07/2016 CLINICAL DATA:  Left knee pain since a fall yesterday. Initial encounter. EXAM: LEFT KNEE - 1-2 VIEW COMPARISON:  None. FINDINGS: There is no acute bony or joint abnormality. Mild patellofemoral degenerative change is noted. Atherosclerotic calcifications are seen. No joint effusion. IMPRESSION: No acute abnormality. Mild patellofemoral degenerative disease. Atherosclerosis. Electronically Signed   By: Inge Rise M.D.   On: 09/07/2016 17:07   Dg Foot 2 Views Left  Result Date: 09/07/2016 CLINICAL DATA:  Left foot pain since a fall yesterday. Initial encounter. EXAM: LEFT FOOT - 2 VIEW COMPARISON:  Plain films left foot 09/06/2016. FINDINGS: Soft tissue swelling is seen over the dorsum of the foot as on yesterday's examination. No acute bony or joint abnormality is identified. Bones are osteopenic. No evidence of arthropathy. IMPRESSION: Soft tissue swelling without underlying acute bony or joint abnormality. Osteopenia. Electronically Signed   By:  Inge Rise M.D.   On: 09/07/2016 17:06   Dg Hip Port Unilat With Pelvis 1v Left  Result Date: 09/07/2016 CLINICAL DATA:  Status post left hip replacement due to a fracture suffered in a fall yesterday. Postoperative imaging. Initial encounter. EXAM: DG HIP (WITH OR WITHOUT PELVIS) 1V PORT LEFT COMPARISON:  Plain films left hip 09/06/2016. FINDINGS: Bipolar left hip hemiarthroplasty is in place with a single cerclage wire about the proximal femur. The device is located. No acute fracture is seen. Gas in the soft tissues from surgery and surgical staples are noted. IMPRESSION: Status post left hip replacement.  No acute abnormality. Electronically Signed   By: Inge Rise M.D.   On: 09/07/2016 17:08     ASSESSMENT AND PLAN:   83 year old female with past medical history of acute process, hypertension, hyperlipidemia, GERD, who presented to the hospital with a fall and noted to have Left hip fracture.   1. Status post fall and left hip fracture-status post left hip arthroplasty postop day #2. -Continue pain control and tolerating PT well. Likely discharge to rehabilitation tomorrow.   2. Essential hypertension-continue Norvasc, losartan, Metoprolol, Hydralazine - BP stable.   3. Osteoporosis - cont. Vit. D supplements.   4. Hyperlipidemia - cont. Atorvastatin.   All the records are reviewed and case discussed with Care Management/Social Worker. Management plans discussed with the patient, family and they are in agreement.  CODE STATUS: DO NOT RESUSCITATE  DVT Prophylaxis: Lovenox  TOTAL TIME TAKING CARE OF THIS PATIENT: 25 minutes.   POSSIBLE D/C IN 1-2 DAYS, DEPENDING ON CLINICAL CONDITION.   Henreitta Leber M.D on 09/09/2016 at 2:12 PM  Between 7am to 6pm - Pager - [phone]  After 6pm go to [email or web address] - Proofreader  [facility]  Office  [phone]  CC: Primary care physician; Kirk Ruths., MD

## 2016-09-09 NOTE — Progress Notes (Signed)
Physical Therapy Treatment Patient Details Name: Jacqueline Sanchez MRN: [id number] DOB: [birth year] Today's Date: 09/09/2016    History of Present Illness 83 y.o. female s/p Hemiarthroplasty repair of a Left Femur Fracture following a fall when making her bed. Pt. PMHx includes: Asthma, CKD 3, GERD, HTN, Hyperlidiemia, Osteoporosis, and TIA.    PT Comments    Pt continues to make consistent gains with strength and ambulation - she needed only very minimal direct assist to unweight L LE on the first 2 steps and then was able to appropriately use the walker to take reciprocating and much more consistent steps.  She is still very limited with bed mobility and needed assist to get to sitting.  She is still functionally quite limited but making steady and consistent gains.    Follow Up Recommendations  SNF     Equipment Recommendations       Recommendations for Other Services       Precautions / Restrictions Precautions Precautions: Posterior Hip Precaution Booklet Issued: Yes (comment) Precaution Comments: Pt struggles with naming any of the precautions Restrictions LLE Weight Bearing: Weight bearing as tolerated    Mobility  Bed Mobility Overal bed mobility: Needs Assistance Bed Mobility: Sit to Supine       Sit to supine: Mod assist;Max assist   General bed mobility comments: Pt unable to lift LEs to get back into bed, guarded with max assist to get back into bed  Transfers Overall transfer level: Needs assistance Equipment used: Rolling walker (2 wheeled) Transfers: Sit to/from Stand Sit to Stand: Mod assist         General transfer comment: Pt showed good effort in getting up to standing, still requires considerable assist and cuing to get to standing  Ambulation/Gait Ambulation/Gait assistance: Min assist;Min guard Ambulation Distance (Feet): 40 Feet Assistive device: Rolling walker (2 wheeled)       General Gait Details: Pt continues to improve with  ambulation and though she is still very hesitant with WBing on the L she was able to advance R LE well, showed improved efficiency with the walker and though it was stop/go she was much faster and more fluid than any previous walking effort   Stairs            Wheelchair Mobility    Modified Rankin (Stroke Patients Only)       Balance                                            Cognition Arousal/Alertness: Awake/alert Behavior During Therapy: WFL for tasks assessed/performed Overall Cognitive Status: Within Functional Limits for tasks assessed                                 General Comments: Pt again with some delayed ability to follow instruction, but more alert than this AM.      Exercises Total Joint Exercises Ankle Circles/Pumps: AROM;10 reps Quad Sets: Strengthening;15 reps Gluteal Sets: Strengthening;15 reps Short Arc Quad: AROM;10 reps;AAROM Heel Slides: AAROM;10 reps Hip ABduction/ADduction: AAROM;10 reps Straight Leg Raises: AROM;10 reps;AAROM (pt was able to do the last 3 reps w/o assist)    General Comments        Pertinent Vitals/Pain Pain Score: 5  (pt reports medium pain, agrees with PT when "5" was offered)  Home Living                      Prior Function            PT Goals (current goals can now be found in the care plan section) Progress towards PT goals: Progressing toward goals    Frequency    BID      PT Plan Current plan remains appropriate    Co-evaluation             End of Session Equipment Utilized During Treatment: Gait belt;Oxygen (pt's O2 remained in the high 90s during amb on 3 liters) Activity Tolerance: Patient tolerated treatment well;Patient limited by fatigue Patient left: with bed alarm set;with call bell/phone within reach;with family/visitor present Nurse Communication: Mobility status (mental status changes from yesterday) PT Visit Diagnosis: Difficulty in  walking, not elsewhere classified (R26.2);Muscle weakness (generalized) (M62.81)     Time: 2761-4709 PT Time Calculation (min) (ACUTE ONLY): 43 min  Charges:  $Gait Training: 8-22 mins $Therapeutic Exercise: 23-37 mins                    G Codes:        Kreg Shropshire, DPT 09/09/2016, 3:46 PM

## 2016-09-10 DIAGNOSIS — Z743 Need for continuous supervision: Secondary | ICD-10-CM | POA: Diagnosis not present

## 2016-09-10 DIAGNOSIS — M810 Age-related osteoporosis without current pathological fracture: Secondary | ICD-10-CM | POA: Diagnosis not present

## 2016-09-10 DIAGNOSIS — I10 Essential (primary) hypertension: Secondary | ICD-10-CM | POA: Diagnosis not present

## 2016-09-10 DIAGNOSIS — M25552 Pain in left hip: Secondary | ICD-10-CM | POA: Diagnosis not present

## 2016-09-10 DIAGNOSIS — I129 Hypertensive chronic kidney disease with stage 1 through stage 4 chronic kidney disease, or unspecified chronic kidney disease: Secondary | ICD-10-CM | POA: Diagnosis not present

## 2016-09-10 DIAGNOSIS — S72012A Unspecified intracapsular fracture of left femur, initial encounter for closed fracture: Secondary | ICD-10-CM | POA: Diagnosis not present

## 2016-09-10 DIAGNOSIS — M25559 Pain in unspecified hip: Secondary | ICD-10-CM | POA: Diagnosis not present

## 2016-09-10 DIAGNOSIS — N183 Chronic kidney disease, stage 3 (moderate): Secondary | ICD-10-CM | POA: Diagnosis not present

## 2016-09-10 DIAGNOSIS — D509 Iron deficiency anemia, unspecified: Secondary | ICD-10-CM | POA: Diagnosis not present

## 2016-09-10 DIAGNOSIS — Z96642 Presence of left artificial hip joint: Secondary | ICD-10-CM | POA: Diagnosis not present

## 2016-09-10 DIAGNOSIS — R2689 Other abnormalities of gait and mobility: Secondary | ICD-10-CM | POA: Diagnosis not present

## 2016-09-10 DIAGNOSIS — E785 Hyperlipidemia, unspecified: Secondary | ICD-10-CM | POA: Diagnosis not present

## 2016-09-10 DIAGNOSIS — S72002A Fracture of unspecified part of neck of left femur, initial encounter for closed fracture: Secondary | ICD-10-CM | POA: Diagnosis not present

## 2016-09-10 DIAGNOSIS — Z471 Aftercare following joint replacement surgery: Secondary | ICD-10-CM | POA: Diagnosis not present

## 2016-09-10 DIAGNOSIS — M6281 Muscle weakness (generalized): Secondary | ICD-10-CM | POA: Diagnosis not present

## 2016-09-10 LAB — CBC
HCT: 23.6 % — ABNORMAL LOW (ref 35.0–47.0)
Hemoglobin: 7.8 g/dL — ABNORMAL LOW (ref 12.0–16.0)
MCH: 32.2 pg (ref 26.0–34.0)
MCHC: 32.9 g/dL (ref 32.0–36.0)
MCV: 97.7 fL (ref 80.0–100.0)
PLATELETS: 194 10*3/uL (ref 150–440)
RBC: 2.42 MIL/uL — ABNORMAL LOW (ref 3.80–5.20)
RDW: 13.1 % (ref 11.5–14.5)
WBC: 12 10*3/uL — ABNORMAL HIGH (ref 3.6–11.0)

## 2016-09-10 MED ORDER — ACETAMINOPHEN 325 MG PO TABS: 650.0000 mg | ORAL_TABLET | Freq: Four times a day (QID) | ORAL | Status: DC | PRN

## 2016-09-10 MED ORDER — OXYCODONE-ACETAMINOPHEN 5-325 MG PO TABS
1.0000 | ORAL_TABLET | Freq: Four times a day (QID) | ORAL | 0 refills | Status: DC | PRN
Start: 2016-09-10 — End: 2016-11-05

## 2016-09-10 NOTE — Progress Notes (Signed)
Pt discharged to [facility]. Reviewed Rx's and d/c instructions. Dressing changed to left hip. Report given to Caryl Pina, [place]. EMS transported pt via stretcher.

## 2016-09-10 NOTE — Progress Notes (Signed)
Occupational Therapy Treatment Patient Details Name: Jacqueline Sanchez MRN: [id number] DOB: 02-Jun-1919 Today's Date: 09/10/2016    History of present illness 83 y.o. female s/p Hemiarthroplasty repair of a Left Femur Fracture following a fall when making her bed. Pt. PMHx includes: Asthma, CKD 3, GERD, HTN, Hyperlidiemia, Osteoporosis, and TIA.   OT comments  Patient was lying in bed when OT arrived. Patient agreeable to work with OT. Able to perform supine to sit with MIN/MOD A for managing BLE movement. Performed sit to stand with MIN/MOD A. Initial stand required more assist due to posterior lean. Patient needed assist with toileting hygiene. Max A for clothing management and hygiene. Patient able to perform transfers with Min/Mod A using rolling walker, extra time and verbal cues for foot placement.    Follow Up Recommendations  SNF    Equipment Recommendations       Recommendations for Other Services      Precautions / Restrictions Precautions Precautions: Posterior Hip Restrictions Weight Bearing Restrictions: Yes LLE Weight Bearing: Weight bearing as tolerated       Mobility Bed Mobility Overal bed mobility: Needs Assistance Bed Mobility: Sit to Supine     Supine to sit: Min assist;Mod assist     General bed mobility comments: getting OOB with OT upon arrival  Transfers Overall transfer level: Needs assistance Equipment used: Rolling walker (2 wheeled) Transfers: Sit to/from Stand Sit to Stand: Min assist;Mod assist;From elevated surface         General transfer comment: Needs cues to obtain balance with posterior lean noted on first standing from bed level.    Balance Overall balance assessment: Needs assistance Sitting-balance support: Feet supported Sitting balance-Leahy Scale: Fair     Standing balance support: Bilateral upper extremity supported Standing balance-Leahy Scale: Fair                             ADL either performed or  assessed with clinical judgement   ADL Overall ADL's : Needs assistance/impaired             Lower Body Bathing: Maximal assistance       Lower Body Dressing: Maximal assistance   Toilet Transfer: Minimal assistance;Moderate assistance   Toileting- Clothing Manipulation and Hygiene: Maximal assistance       Functional mobility during ADLs: Moderate assistance       Vision       Perception     Praxis      Cognition Arousal/Alertness: Awake/alert Behavior During Therapy: WFL for tasks assessed/performed Overall Cognitive Status: Within Functional Limits for tasks assessed                                          Exercises Other Exercises Other Exercises: Seated AROM for ankle pumps, LAQ, ab/adduction.  - supine exercises deferred as she had just transitioned out of bed   Shoulder Instructions       General Comments      Pertinent Vitals/ Pain       Pain Assessment: No/denies pain Pain Descriptors / Indicators: Aching Pain Intervention(s): Limited activity within patient's tolerance  Home Living Family/patient expects to be discharged to:: Assisted living Living Arrangements: Alone  Prior Functioning/Environment              Frequency  Min 1X/week        Progress Toward Goals  OT Goals(current goals can now be found in the care plan section)  Progress towards OT goals: Progressing toward goals  Acute Rehab OT Goals Patient Stated Goal: To return to [facility]. OT Goal Formulation: With patient Potential to Achieve Goals: Good  Plan Discharge plan remains appropriate    Co-evaluation                 End of Session Equipment Utilized During Treatment: Gait belt;Rolling walker  OT Visit Diagnosis: Muscle weakness (generalized) (M62.81)   Activity Tolerance Patient tolerated treatment well   Patient Left in chair;with call bell/phone within reach;with chair  alarm set   Nurse Communication Mobility status        Time: 5750-5183 OT Time Calculation (min): 23 min  Charges: OT General Charges $OT Visit: 1 Procedure OT Treatments $Self Care/Home Management : 23-37 mins  Amie [place], OTR/L  Allysen Lazo L 09/10/2016, 1:03 PM

## 2016-09-10 NOTE — Progress Notes (Signed)
Subjective: 3 Days Post-Op Procedure(s) (LRB): ARTHROPLASTY BIPOLAR HIP (HEMIARTHROPLASTY) (Left)    Patient reports pain as mild. Alert and oob in chair.  D/C today and see Dr Mack Guise in one week.  PWB on operative leg.   Objective:   VITALS:   Vitals:   09/09/16 2334 09/10/16 1119  BP: (!) 146/50 (!) 149/46  Pulse: (!) 103 91  Resp: 18 (!) 22  Temp: 98 F (36.7 C) 98.6 F (37 C)    Neurologically intact ABD soft Neurovascular intact Sensation intact distally Intact pulses distally Dorsiflexion/Plantar flexion intact Incision: no drainage  LABS  Recent Labs  09/08/16 0502 09/09/16 0420 09/10/16 0407  HGB 9.2* 8.7* 7.8*  HCT 27.7* 25.4* 23.6*  WBC 8.6 11.2* 12.0*  PLT 208 178 194     Recent Labs  09/08/16 0502 09/09/16 0420  NA 138 135  K 3.9 4.2  BUN 14 21*  CREATININE 1.08* 1.24*  GLUCOSE 123* 102*    No results for input(s): LABPT, INR in the last 72 hours.   Assessment/Plan: 3 Days Post-Op Procedure(s) (LRB): ARTHROPLASTY BIPOLAR HIP (HEMIARTHROPLASTY) (Left)   Up with therapy Discharge to SNF

## 2016-09-10 NOTE — Progress Notes (Signed)
Physical Therapy Treatment Patient Details Name: Jacqueline Sanchez MRN: [id number] DOB: 06-11-1919 Today's Date: 09/10/2016    History of Present Illness 83 y.o. female s/p Hemiarthroplasty repair of a Left Femur Fracture following a fall when making her bed. Pt. PMHx includes: Asthma, CKD 3, GERD, HTN, Hyperlidiemia, Osteoporosis, and TIA.    PT Comments    Pt transferring to chair with OT upon arrival.  Seated rest given then completed seated exercises.  Sats on O2 support 96%.  RN in room and discussed.  O2 removed and she remained greater than 94% throughout session on room air with gait.  Ambulated 5' with walker and min guard/assist at times due to keeping walker too close to her causing imbalances.  Overall tolerated well.     Follow Up Recommendations  SNF     Equipment Recommendations       Recommendations for Other Services       Precautions / Restrictions Precautions Precautions: Posterior Hip Restrictions Weight Bearing Restrictions: Yes LLE Weight Bearing: Weight bearing as tolerated    Mobility  Bed Mobility Overal bed mobility: Needs Assistance             General bed mobility comments: getting OOB with OT upon arrival  Transfers Overall transfer level: Needs assistance Equipment used: Rolling walker (2 wheeled) Transfers: Sit to/from Stand              Ambulation/Gait Ambulation/Gait assistance: Min assist Ambulation Distance (Feet): 60 Feet Assistive device: Rolling walker (2 wheeled)     Gait velocity interpretation: Below normal speed for age/gender General Gait Details: Verbal cues for walker positioning at times   Stairs            Wheelchair Mobility    Modified Rankin (Stroke Patients Only)       Balance Overall balance assessment: Needs assistance Sitting-balance support: Feet supported Sitting balance-Leahy Scale: Fair     Standing balance support: Bilateral upper extremity supported Standing balance-Leahy  Scale: Fair                              Cognition Arousal/Alertness: Awake/alert Behavior During Therapy: WFL for tasks assessed/performed Overall Cognitive Status: Within Functional Limits for tasks assessed                                        Exercises Other Exercises Other Exercises: Seated AROM for ankle pumps, LAQ, ab/adduction.  - supine exercises deferred as she had just transitioned out of bed    General Comments        Pertinent Vitals/Pain Pain Assessment: No/denies pain Pain Descriptors / Indicators: Aching Pain Intervention(s): Limited activity within patient's tolerance    Home Living                      Prior Function            PT Goals (current goals can now be found in the care plan section) Progress towards PT goals: Progressing toward goals    Frequency    BID      PT Plan Current plan remains appropriate    Co-evaluation             End of Session Equipment Utilized During Treatment: Gait belt Activity Tolerance: Patient tolerated treatment well Patient left: in chair;with chair alarm set;with call bell/phone  within reach Nurse Communication: Mobility status;Other (comment)       Time: 5396-7289 PT Time Calculation (min) (ACUTE ONLY): 23 min  Charges:  $Gait Training: 8-22 mins $Therapeutic Exercise: 8-22 mins                    G Codes:       Chesley Noon, PTA 09/10/16, 12:30 PM

## 2016-09-10 NOTE — Clinical Social Work Note (Signed)
Patient to dc to [facility] today via non-emergent EMS. Packet has been delivered to unit. The family and facility are aware. CSW will con't to follow pending additional dc needs.  Santiago Bumpers, MSW, Latanya Presser [phone]

## 2016-09-12 DIAGNOSIS — M810 Age-related osteoporosis without current pathological fracture: Secondary | ICD-10-CM

## 2016-09-12 DIAGNOSIS — N183 Chronic kidney disease, stage 3 (moderate): Secondary | ICD-10-CM | POA: Diagnosis not present

## 2016-09-12 DIAGNOSIS — E785 Hyperlipidemia, unspecified: Secondary | ICD-10-CM | POA: Diagnosis not present

## 2016-09-12 DIAGNOSIS — S72012A Unspecified intracapsular fracture of left femur, initial encounter for closed fracture: Secondary | ICD-10-CM | POA: Diagnosis not present

## 2016-09-19 DIAGNOSIS — M25552 Pain in left hip: Secondary | ICD-10-CM | POA: Diagnosis not present

## 2016-09-30 ENCOUNTER — Encounter: Payer: Self-pay | Admitting: Internal Medicine

## 2016-10-05 DIAGNOSIS — M6281 Muscle weakness (generalized): Secondary | ICD-10-CM | POA: Diagnosis not present

## 2016-10-05 DIAGNOSIS — R2681 Unsteadiness on feet: Secondary | ICD-10-CM | POA: Diagnosis not present

## 2016-10-05 DIAGNOSIS — R2689 Other abnormalities of gait and mobility: Secondary | ICD-10-CM | POA: Diagnosis not present

## 2016-10-07 DIAGNOSIS — M6281 Muscle weakness (generalized): Secondary | ICD-10-CM | POA: Diagnosis not present

## 2016-10-07 DIAGNOSIS — R2681 Unsteadiness on feet: Secondary | ICD-10-CM | POA: Diagnosis not present

## 2016-10-07 DIAGNOSIS — R2689 Other abnormalities of gait and mobility: Secondary | ICD-10-CM | POA: Diagnosis not present

## 2016-10-10 DIAGNOSIS — R2689 Other abnormalities of gait and mobility: Secondary | ICD-10-CM | POA: Diagnosis not present

## 2016-10-10 DIAGNOSIS — R2681 Unsteadiness on feet: Secondary | ICD-10-CM | POA: Diagnosis not present

## 2016-10-10 DIAGNOSIS — M6281 Muscle weakness (generalized): Secondary | ICD-10-CM | POA: Diagnosis not present

## 2016-10-12 ENCOUNTER — Encounter: Payer: Self-pay | Admitting: Internal Medicine

## 2016-10-12 ENCOUNTER — Non-Acute Institutional Stay: Payer: Medicare Other | Admitting: Internal Medicine

## 2016-10-12 DIAGNOSIS — N183 Chronic kidney disease, stage 3 unspecified: Secondary | ICD-10-CM

## 2016-10-12 DIAGNOSIS — G479 Sleep disorder, unspecified: Secondary | ICD-10-CM | POA: Diagnosis not present

## 2016-10-12 DIAGNOSIS — E7800 Pure hypercholesterolemia, unspecified: Secondary | ICD-10-CM | POA: Diagnosis not present

## 2016-10-12 DIAGNOSIS — I10 Essential (primary) hypertension: Secondary | ICD-10-CM | POA: Diagnosis not present

## 2016-10-12 DIAGNOSIS — M159 Polyosteoarthritis, unspecified: Secondary | ICD-10-CM

## 2016-10-12 DIAGNOSIS — S72002D Fracture of unspecified part of neck of left femur, subsequent encounter for closed fracture with routine healing: Secondary | ICD-10-CM

## 2016-10-12 DIAGNOSIS — M6281 Muscle weakness (generalized): Secondary | ICD-10-CM | POA: Diagnosis not present

## 2016-10-12 DIAGNOSIS — R2681 Unsteadiness on feet: Secondary | ICD-10-CM | POA: Diagnosis not present

## 2016-10-12 DIAGNOSIS — R2689 Other abnormalities of gait and mobility: Secondary | ICD-10-CM | POA: Diagnosis not present

## 2016-10-12 DIAGNOSIS — E785 Hyperlipidemia, unspecified: Secondary | ICD-10-CM | POA: Insufficient documentation

## 2016-10-12 NOTE — Assessment & Plan Note (Signed)
She is not happy with pain control Increase Percocet to TID- 8a, 2p, 8p Continue prn

## 2016-10-12 NOTE — Assessment & Plan Note (Signed)
Controlled Continue Amlodipine, Losartan, Hydralazine and Metoprolol Monitor

## 2016-10-12 NOTE — Assessment & Plan Note (Signed)
Continue Melatonin 

## 2016-10-12 NOTE — Assessment & Plan Note (Signed)
Still with some pain Continue to work with PT Follow up with ortho as planned

## 2016-10-12 NOTE — Progress Notes (Signed)
Subjective:    Patient ID: Jacqueline Sanchez, female    DOB: 03-26-1920, 83 y.o.   MRN: [id number]  HPI  Asked to see pt for routine followup in [address]. Reviewed with RN. Reports staples were just taken out of left hip incision today. Incision intact, no further concerns. Remains independent with ADL's. Walks with walker, currently working with PT. Sleeps well most of the time. Appetite fair. Bowels okay.  HTN: Her BP today is 120/58. She is taking Amlodipine, Losartan, Hydralazine and Metoprolol as prescribed. ECG from 08/2016 reviewed.  OA: Hips, knees. She takes scheduled Percocet daily. She does not feel like her pain is well controlled. She gets at least 1 prn dose of Percocet daily.   Sleep Disturbance: She sleeps well with the use of Melatonin. She feels rested most days when she wakes up.   HLD: No issues on Lipitor.   Left Hip Fracture: She just had her stables taken out today. She has been following with Dr. Rudene Christians. She is working with PT.   Anemia of CKD: Her most recent H/H was 24.4/7.9. Creatinine 1.45. She is taking a daily iron supplement as prescribed.  Review of Systems      Past Medical History:  Diagnosis Date  . Aphasia 04/2016   Transient---??TIA  . Asthma   . CKD (chronic kidney disease)   . Edema   . Femur fracture, left ([place]) 09/06/2016  . GERD (gastroesophageal reflux disease)   . Hyperlipidemia   . Hypertension   . Osteoporosis     Current Outpatient Prescriptions  Medication Sig Dispense Refill  . acetaminophen (TYLENOL) 325 MG tablet Take 2 tablets (650 mg total) by mouth every 6 (six) hours as needed for mild pain.    Marland Kitchen amLODipine (NORVASC) 10 MG tablet Take 1 tablet (10 mg total) by mouth daily. 30 tablet 0  . aspirin EC 81 MG tablet Take 81 mg by mouth every other day.    Marland Kitchen atorvastatin (LIPITOR) 80 MG tablet Take 1 tablet by mouth daily.    Marland Kitchen enoxaparin (LOVENOX) 30 MG/0.3ML injection Inject 0.3 mLs (30 mg total) into the skin daily. 0  Syringe   . hydrALAZINE (APRESOLINE) 25 MG tablet Take 1 tablet (25 mg total) by mouth 4 (four) times daily. 120 tablet 1  . losartan (COZAAR) 50 MG tablet Take 1 tablet by mouth daily.    . Melatonin 3 MG TABS Take 1 tablet by mouth at bedtime.    . metoprolol succinate (TOPROL-XL) 50 MG 24 hr tablet Take 1 tablet (50 mg total) by mouth daily. Take with or immediately following a meal. 30 tablet 1  . Multiple Vitamin (MULTI-VITAMINS) TABS Take 1 tablet by mouth daily.    Marland Kitchen oxyCODONE-acetaminophen (PERCOCET/ROXICET) 5-325 MG tablet Take 1 tablet by mouth every 6 (six) hours as needed for severe pain. 30 tablet 0  . polyethylene glycol (MIRALAX / GLYCOLAX) packet Take 17 g by mouth daily.    Marland Kitchen senna (SENOKOT) 8.6 MG tablet Take 2 tablets by mouth 2 (two) times daily.     . vitamin B-12 (CYANOCOBALAMIN) 1000 MCG tablet Take 1,000 mcg by mouth daily.    . Vitamin D, Cholecalciferol, 1000 units CAPS Take 1 capsule by mouth daily.     No current facility-administered medications for this visit.     Allergies  Allergen Reactions  . Morphine And Related Nausea Only    Family History  Problem Relation Age of Onset  . Hypertension Other  Social History   Social History  . Marital status: Widowed    Spouse name: N/A  . Number of children: 2  . Years of education: N/A   Occupational History  . 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 Smoking status: Never Smoker  . Smokeless tobacco: Never Used  . Alcohol use No  . Drug use: Unknown  . Sexual activity: No   Other Topics Concern  . Not on file   Social History Narrative   Widowed 2000   Has living will   Sons/DILs are health care POAs (mostly Mike/Jan)   Has DNR   No tube feeds if cognitively unaware     Constitutional: Denies fever, malaise, fatigue, headache or abrupt weight changes.  Respiratory: Denies difficulty breathing, shortness of breath, cough or sputum production.   Cardiovascular:  Denies chest pain, chest tightness, palpitations or swelling in the hands or feet.  Gastrointestinal: Denies abdominal pain, bloating, constipation, diarrhea or blood in the stool.  GU: Denies urgency, frequency, pain with urination, burning sensation, blood in urine, odor or discharge. Musculoskeletal: Pt reports left hip pain. Denies decrease in range of motion, difficulty with gait, muscle pain or joint swelling.  Skin: Denies redness, rashes, lesions or ulcercations.  Neurological: Denies dizziness, difficulty with memory, difficulty with speech or problems with balance and coordination.  Psych: Denies anxiety, depression, SI/HI.  No other specific complaints in a complete review of systems (except as listed in HPI above).  Objective:   Physical Exam   BP (!) 120/58   Pulse 72   Wt 133 lb (60.3 kg)   BMI 22.83 kg/m  Wt Readings from Last 3 Encounters:  10/12/16 133 lb (60.3 kg)  09/07/16 139 lb (63 kg)  06/23/16 134 lb (60.8 kg)    General: Appears her stated age, in NAD. Skin: Warm, dry and intact. Incision over left hip healed nicely. Cardiovascular: Normal rate and rhythm.  Pulmonary/Chest: Normal effort and positive vesicular breath sounds. No respiratory distress. No wheezes, rales or ronchi noted.  Abdomen: Soft and nontender. Normal bowel sounds.  Musculoskeletal: Gait slow but steady but use of rolling walker. Neurological: Alert and oriented.  Psychiatric: Mood and affect normal. Behavior is normal. Judgment and thought content normal.     BMET    Component Value Date/Time   NA 135 09/09/2016 0420   NA 140 05/30/2011 1101   K 4.2 09/09/2016 0420   K 4.3 05/30/2011 1101   CL 104 09/09/2016 0420   CL 99 05/30/2011 1101   CO2 26 09/09/2016 0420   CO2 30 05/30/2011 1101   GLUCOSE 102 (H) 09/09/2016 0420   GLUCOSE 101 (H) 05/30/2011 1101   BUN 21 (H) 09/09/2016 0420   BUN 20 (H) 05/30/2011 1101   CREATININE 1.24 (H) 09/09/2016 0420   CREATININE 1.69 (H)  11/18/2011 1535   CALCIUM 9.1 09/09/2016 0420   CALCIUM 11.0 (H) 05/30/2011 1101   GFRNONAA 36 (L) 09/09/2016 0420   GFRNONAA 26 (L) 11/18/2011 1535   GFRAA 42 (L) 09/09/2016 0420   GFRAA 31 (L) 11/18/2011 1535    Lipid Panel  No results found for: CHOL, TRIG, HDL, CHOLHDL, VLDL, LDLCALC  CBC    Component Value Date/Time   WBC 12.0 (H) 09/10/2016 0407   RBC 2.42 (L) 09/10/2016 0407   HGB 7.8 (L) 09/10/2016 0407   HGB 12.7 05/30/2011 1101   HCT 23.6 (L) 09/10/2016 0407   HCT 37.8 05/30/2011 1101  PLT 194 09/10/2016 0407   PLT 237 05/30/2011 1101   MCV 97.7 09/10/2016 0407   MCV 101 (H) 05/30/2011 1101   MCH 32.2 09/10/2016 0407   MCHC 32.9 09/10/2016 0407   RDW 13.1 09/10/2016 0407   RDW 13.5 05/30/2011 1101   LYMPHSABS 0.7 (L) 09/06/2016 2338   MONOABS 1.0 (H) 09/06/2016 2338   EOSABS 0.0 09/06/2016 2338   BASOSABS 0.0 09/06/2016 2338    Hgb A1C No results found for: HGBA1C         Assessment & Plan:

## 2016-10-12 NOTE — Assessment & Plan Note (Signed)
-  Continue Lipitor °

## 2016-10-12 NOTE — Assessment & Plan Note (Signed)
Stable Will monitor

## 2016-10-21 DIAGNOSIS — R2689 Other abnormalities of gait and mobility: Secondary | ICD-10-CM | POA: Diagnosis not present

## 2016-10-21 DIAGNOSIS — M6281 Muscle weakness (generalized): Secondary | ICD-10-CM | POA: Diagnosis not present

## 2016-10-24 DIAGNOSIS — Z96642 Presence of left artificial hip joint: Secondary | ICD-10-CM | POA: Diagnosis not present

## 2016-10-25 DIAGNOSIS — M6281 Muscle weakness (generalized): Secondary | ICD-10-CM | POA: Diagnosis not present

## 2016-10-25 DIAGNOSIS — R2689 Other abnormalities of gait and mobility: Secondary | ICD-10-CM | POA: Diagnosis not present

## 2016-10-26 DIAGNOSIS — R2689 Other abnormalities of gait and mobility: Secondary | ICD-10-CM | POA: Diagnosis not present

## 2016-10-26 DIAGNOSIS — M6281 Muscle weakness (generalized): Secondary | ICD-10-CM | POA: Diagnosis not present

## 2016-10-28 DIAGNOSIS — R2689 Other abnormalities of gait and mobility: Secondary | ICD-10-CM | POA: Diagnosis not present

## 2016-10-28 DIAGNOSIS — M6281 Muscle weakness (generalized): Secondary | ICD-10-CM | POA: Diagnosis not present

## 2016-10-31 DIAGNOSIS — M6281 Muscle weakness (generalized): Secondary | ICD-10-CM | POA: Diagnosis not present

## 2016-10-31 DIAGNOSIS — R2689 Other abnormalities of gait and mobility: Secondary | ICD-10-CM | POA: Diagnosis not present

## 2016-11-02 ENCOUNTER — Inpatient Hospital Stay
Admission: EM | Admit: 2016-11-02 | Discharge: 2016-11-05 | DRG: 561 | Disposition: A | Discharge status: Skilled Nursing Facility | Payer: Medicare Other | Attending: Internal Medicine | Admitting: Internal Medicine

## 2016-11-02 ENCOUNTER — Encounter: Payer: Self-pay | Admitting: Emergency Medicine

## 2016-11-02 ENCOUNTER — Emergency Department: Payer: Medicare Other

## 2016-11-02 DIAGNOSIS — Z66 Do not resuscitate: Secondary | ICD-10-CM | POA: Diagnosis present

## 2016-11-02 DIAGNOSIS — Z8673 Personal history of transient ischemic attack (TIA), and cerebral infarction without residual deficits: Secondary | ICD-10-CM | POA: Diagnosis not present

## 2016-11-02 DIAGNOSIS — S73005A Unspecified dislocation of left hip, initial encounter: Secondary | ICD-10-CM

## 2016-11-02 DIAGNOSIS — Y792 Prosthetic and other implants, materials and accessory orthopedic devices associated with adverse incidents: Secondary | ICD-10-CM | POA: Diagnosis present

## 2016-11-02 DIAGNOSIS — Z8249 Family history of ischemic heart disease and other diseases of the circulatory system: Secondary | ICD-10-CM | POA: Diagnosis not present

## 2016-11-02 DIAGNOSIS — S73002A Unspecified subluxation of left hip, initial encounter: Secondary | ICD-10-CM | POA: Diagnosis not present

## 2016-11-02 DIAGNOSIS — Z9889 Other specified postprocedural states: Secondary | ICD-10-CM

## 2016-11-02 DIAGNOSIS — E785 Hyperlipidemia, unspecified: Secondary | ICD-10-CM | POA: Diagnosis present

## 2016-11-02 DIAGNOSIS — Z96642 Presence of left artificial hip joint: Secondary | ICD-10-CM | POA: Diagnosis not present

## 2016-11-02 DIAGNOSIS — Z7982 Long term (current) use of aspirin: Secondary | ICD-10-CM

## 2016-11-02 DIAGNOSIS — T84021A Dislocation of internal left hip prosthesis, initial encounter: Secondary | ICD-10-CM | POA: Diagnosis present

## 2016-11-02 DIAGNOSIS — M79605 Pain in left leg: Secondary | ICD-10-CM | POA: Diagnosis not present

## 2016-11-02 DIAGNOSIS — D631 Anemia in chronic kidney disease: Secondary | ICD-10-CM | POA: Diagnosis present

## 2016-11-02 DIAGNOSIS — J4520 Mild intermittent asthma, uncomplicated: Secondary | ICD-10-CM | POA: Diagnosis present

## 2016-11-02 DIAGNOSIS — R2689 Other abnormalities of gait and mobility: Secondary | ICD-10-CM | POA: Diagnosis not present

## 2016-11-02 DIAGNOSIS — Z471 Aftercare following joint replacement surgery: Secondary | ICD-10-CM | POA: Diagnosis not present

## 2016-11-02 DIAGNOSIS — K219 Gastro-esophageal reflux disease without esophagitis: Secondary | ICD-10-CM | POA: Diagnosis not present

## 2016-11-02 DIAGNOSIS — I129 Hypertensive chronic kidney disease with stage 1 through stage 4 chronic kidney disease, or unspecified chronic kidney disease: Secondary | ICD-10-CM | POA: Diagnosis present

## 2016-11-02 DIAGNOSIS — Z7401 Bed confinement status: Secondary | ICD-10-CM | POA: Diagnosis not present

## 2016-11-02 DIAGNOSIS — M25552 Pain in left hip: Secondary | ICD-10-CM | POA: Diagnosis not present

## 2016-11-02 DIAGNOSIS — Z885 Allergy status to narcotic agent status: Secondary | ICD-10-CM | POA: Diagnosis not present

## 2016-11-02 DIAGNOSIS — S73005D Unspecified dislocation of left hip, subsequent encounter: Secondary | ICD-10-CM | POA: Diagnosis not present

## 2016-11-02 DIAGNOSIS — R9431 Abnormal electrocardiogram [ECG] [EKG]: Secondary | ICD-10-CM | POA: Diagnosis not present

## 2016-11-02 DIAGNOSIS — Z8781 Personal history of (healed) traumatic fracture: Secondary | ICD-10-CM

## 2016-11-02 DIAGNOSIS — N183 Chronic kidney disease, stage 3 (moderate): Secondary | ICD-10-CM | POA: Diagnosis present

## 2016-11-02 DIAGNOSIS — N179 Acute kidney failure, unspecified: Secondary | ICD-10-CM | POA: Diagnosis not present

## 2016-11-02 DIAGNOSIS — M810 Age-related osteoporosis without current pathological fracture: Secondary | ICD-10-CM | POA: Diagnosis present

## 2016-11-02 DIAGNOSIS — M6281 Muscle weakness (generalized): Secondary | ICD-10-CM | POA: Diagnosis not present

## 2016-11-02 DIAGNOSIS — T84021D Dislocation of internal left hip prosthesis, subsequent encounter: Secondary | ICD-10-CM | POA: Diagnosis not present

## 2016-11-02 DIAGNOSIS — Z419 Encounter for procedure for purposes other than remedying health state, unspecified: Secondary | ICD-10-CM

## 2016-11-02 DIAGNOSIS — I10 Essential (primary) hypertension: Secondary | ICD-10-CM | POA: Diagnosis not present

## 2016-11-02 DIAGNOSIS — M25559 Pain in unspecified hip: Secondary | ICD-10-CM | POA: Diagnosis not present

## 2016-11-02 DIAGNOSIS — S73036D Other anterior dislocation of unspecified hip, subsequent encounter: Secondary | ICD-10-CM | POA: Diagnosis not present

## 2016-11-02 LAB — CBC
HCT: 36.7 % (ref 35.0–47.0)
Hemoglobin: 12.2 g/dL (ref 12.0–16.0)
MCH: 32.8 pg (ref 26.0–34.0)
MCHC: 33.3 g/dL (ref 32.0–36.0)
MCV: 98.7 fL (ref 80.0–100.0)
PLATELETS: 347 10*3/uL (ref 150–440)
RBC: 3.72 MIL/uL — AB (ref 3.80–5.20)
RDW: 14.3 % (ref 11.5–14.5)
WBC: 10.4 10*3/uL (ref 3.6–11.0)

## 2016-11-02 LAB — BASIC METABOLIC PANEL
ANION GAP: 9 (ref 5–15)
BUN: 17 mg/dL (ref 6–20)
CALCIUM: 10.7 mg/dL — AB (ref 8.9–10.3)
CO2: 23 mmol/L (ref 22–32)
Chloride: 104 mmol/L (ref 101–111)
Creatinine, Ser: 1.21 mg/dL — ABNORMAL HIGH (ref 0.44–1.00)
GFR calc Af Amer: 43 mL/min — ABNORMAL LOW (ref >60)
GFR, EST NON AFRICAN AMERICAN: 37 mL/min — AB (ref >60)
GLUCOSE: 122 mg/dL — AB (ref 65–99)
POTASSIUM: 4.3 mmol/L (ref 3.5–5.1)
SODIUM: 136 mmol/L (ref 135–145)

## 2016-11-02 LAB — PROTIME-INR
INR: 0.86
Prothrombin Time: 11.7 seconds (ref 11.4–15.2)

## 2016-11-02 LAB — APTT: APTT: 27 s (ref 24–36)

## 2016-11-02 MED ORDER — ACETAMINOPHEN 325 MG PO TABS
650.0000 mg | ORAL_TABLET | Freq: Four times a day (QID) | ORAL | Status: DC | PRN
  Administered 2016-11-04: 650 mg via ORAL
  Filled 2016-11-02: qty 2

## 2016-11-02 MED ORDER — PANTOPRAZOLE SODIUM 40 MG PO TBEC
DELAYED_RELEASE_TABLET | ORAL | Status: AC
  Filled 2016-11-02: qty 1

## 2016-11-02 MED ORDER — SENNA 8.6 MG PO TABS
2.0000 | ORAL_TABLET | Freq: Two times a day (BID) | ORAL | Status: DC
  Administered 2016-11-02 – 2016-11-05 (×5): 17.2 mg via ORAL
  Filled 2016-11-02 (×5): qty 2

## 2016-11-02 MED ORDER — MELATONIN 5 MG PO TABS
5.0000 mg | ORAL_TABLET | Freq: Every day | ORAL | Status: DC
  Administered 2016-11-04: 5 mg via ORAL
  Filled 2016-11-02 (×4): qty 1

## 2016-11-02 MED ORDER — ADULT MULTIVITAMIN W/MINERALS CH
1.0000 | ORAL_TABLET | Freq: Every day | ORAL | Status: DC
  Administered 2016-11-04 – 2016-11-05 (×2): 1 via ORAL
  Filled 2016-11-02 (×2): qty 1

## 2016-11-02 MED ORDER — PANTOPRAZOLE SODIUM 40 MG PO TBEC
40.0000 mg | DELAYED_RELEASE_TABLET | Freq: Every day | ORAL | Status: DC
  Administered 2016-11-02 – 2016-11-05 (×4): 40 mg via ORAL
  Filled 2016-11-02 (×3): qty 1

## 2016-11-02 MED ORDER — METOPROLOL SUCCINATE ER 50 MG PO TB24
50.0000 mg | ORAL_TABLET | Freq: Every day | ORAL | Status: DC
  Administered 2016-11-03 – 2016-11-05 (×3): 50 mg via ORAL
  Filled 2016-11-02 (×3): qty 1

## 2016-11-02 MED ORDER — OXYCODONE-ACETAMINOPHEN 5-325 MG PO TABS
1.0000 | ORAL_TABLET | Freq: Four times a day (QID) | ORAL | Status: DC | PRN
  Administered 2016-11-03 – 2016-11-05 (×3): 1 via ORAL
  Filled 2016-11-02 (×3): qty 1

## 2016-11-02 MED ORDER — OXYCODONE-ACETAMINOPHEN 5-325 MG PO TABS
ORAL_TABLET | ORAL | Status: AC
  Filled 2016-11-02: qty 1

## 2016-11-02 MED ORDER — VITAMIN D 1000 UNITS PO TABS
1000.0000 [IU] | ORAL_TABLET | Freq: Every day | ORAL | Status: DC
  Administered 2016-11-03 – 2016-11-05 (×3): 1000 [IU] via ORAL
  Filled 2016-11-02 (×3): qty 1

## 2016-11-02 MED ORDER — ATORVASTATIN CALCIUM 20 MG PO TABS
80.0000 mg | ORAL_TABLET | Freq: Every evening | ORAL | Status: DC
  Administered 2016-11-02 – 2016-11-04 (×3): 80 mg via ORAL
  Filled 2016-11-02 (×3): qty 4

## 2016-11-02 MED ORDER — OXYCODONE HCL 5 MG PO TABS: 5.0000 mg | ORAL_TABLET | ORAL | Status: DC | PRN

## 2016-11-02 MED ORDER — OXYCODONE HCL 5 MG PO TABS
ORAL_TABLET | ORAL | Status: AC
  Filled 2016-11-02: qty 1

## 2016-11-02 MED ORDER — AMLODIPINE BESYLATE 10 MG PO TABS
10.0000 mg | ORAL_TABLET | Freq: Every day | ORAL | Status: DC
  Administered 2016-11-03 – 2016-11-04 (×2): 10 mg via ORAL
  Filled 2016-11-02 (×4): qty 1

## 2016-11-02 MED ORDER — HYDRALAZINE HCL 25 MG PO TABS
25.0000 mg | ORAL_TABLET | Freq: Three times a day (TID) | ORAL | Status: DC
  Administered 2016-11-02 – 2016-11-05 (×6): 25 mg via ORAL
  Filled 2016-11-02 (×8): qty 1

## 2016-11-02 MED ORDER — ASPIRIN EC 81 MG PO TBEC
81.0000 mg | DELAYED_RELEASE_TABLET | ORAL | Status: DC
  Administered 2016-11-03 – 2016-11-05 (×2): 81 mg via ORAL
  Filled 2016-11-02 (×2): qty 1

## 2016-11-02 MED ORDER — FENTANYL CITRATE (PF) 100 MCG/2ML IJ SOLN
INTRAMUSCULAR | Status: AC
Start: 2016-11-02 — End: 2016-11-03
  Filled 2016-11-02: qty 2

## 2016-11-02 MED ORDER — OXYCODONE HCL 5 MG PO TABS
5.0000 mg | ORAL_TABLET | ORAL | Status: DC | PRN
  Administered 2016-11-02 (×2): 5 mg via ORAL
  Administered 2016-11-03 (×2): 10 mg via ORAL
  Filled 2016-11-02 (×2): qty 2
  Filled 2016-11-02: qty 1

## 2016-11-02 MED ORDER — FENTANYL CITRATE (PF) 100 MCG/2ML IJ SOLN
12.5000 ug | Freq: Once | INTRAMUSCULAR | Status: AC
  Administered 2016-11-02: 12.5 ug via INTRAVENOUS

## 2016-11-02 MED ORDER — VITAMIN B-12 1000 MCG PO TABS
1000.0000 ug | ORAL_TABLET | Freq: Every day | ORAL | Status: DC
  Administered 2016-11-03 – 2016-11-05 (×3): 1000 ug via ORAL
  Filled 2016-11-02 (×3): qty 1

## 2016-11-02 MED ORDER — POLYETHYLENE GLYCOL 3350 17 G PO PACK
17.0000 g | PACK | Freq: Every day | ORAL | Status: DC
  Administered 2016-11-04 – 2016-11-05 (×2): 17 g via ORAL
  Filled 2016-11-02 (×2): qty 1

## 2016-11-02 MED ORDER — DOCUSATE SODIUM 100 MG PO CAPS: 100.0000 mg | ORAL_CAPSULE | Freq: Two times a day (BID) | ORAL | Status: DC | PRN

## 2016-11-02 MED ORDER — FERROUS SULFATE 325 (65 FE) MG PO TABS: 325.0000 mg | ORAL_TABLET | Freq: Every day | ORAL | Status: DC

## 2016-11-02 NOTE — Progress Notes (Signed)
Family Meeting Note  Advance Directive:yes  Today a meeting took place with the Patient.  The following clinical team members were present during this meeting:MD  The following were discussed:Patient's diagnosis:HIP FRACTURE , Patient's progosis: Unable to determine and Goals for treatment: DNR  Additional follow-up to be provided: ortho consult.  Time spent during discussion:20 minutes  Jacqueline Sanchez, Jacqueline Macadamia, MD

## 2016-11-02 NOTE — H&P (Signed)
[facility] [place] NAME: Jacqueline Sanchez    MR#:  [id number]  DATE OF BIRTH:  1920-04-21  DATE OF ADMISSION:  11/02/2016  PRIMARY CARE PHYSICIAN: Venia Carbon, MD   REQUESTING/REFERRING PHYSICIAN: Quale  CHIEF COMPLAINT:   Chief Complaint  Patient presents with  . Hip Pain    HISTORY OF PRESENT ILLNESS: Jacqueline Sanchez  is a 83 y.o. female with a known history of Asthma, see daily, femur fracture and hemiarthroplasty in April 2018, hyperlipidemia, hypertension, osteoporosis- today as he suddenly felt a pop in her left hip and it started hurting severely. She came to emergency room because of that and in ER on x-ray she is noted to have dislocation of the hip prosthesis.  PAST MEDICAL HISTORY:   Past Medical History:  Diagnosis Date  . Aphasia 04/2016   Transient---??TIA  . Asthma   . CKD (chronic kidney disease)   . Edema   . Femur fracture, left ([place]) 09/06/2016  . GERD (gastroesophageal reflux disease)   . Hyperlipidemia   . Hypertension   . Osteoporosis     PAST SURGICAL HISTORY: Past Surgical History:  Procedure Laterality Date  . HIP ARTHROPLASTY Left 09/07/2016   Procedure: ARTHROPLASTY BIPOLAR HIP (HEMIARTHROPLASTY);  Surgeon: Thornton Park, MD;  Location: [facility] ORS;  Service: Orthopedics;  Laterality: Left;  . KYPHOPLASTY      SOCIAL HISTORY:  Social History  Substance Use Topics  . Smoking status: Never Smoker  . Smokeless tobacco: Never Used  . Alcohol use No    FAMILY HISTORY:  Family History  Problem Relation Age of Onset  . Hypertension Other     DRUG ALLERGIES:  Allergies  Allergen Reactions  . Morphine And Related Nausea Only    REVIEW OF SYSTEMS:   CONSTITUTIONAL: No fever, fatigue or weakness.  EYES: No blurred or double vision.  EARS, NOSE, AND THROAT: No tinnitus or ear pain.  RESPIRATORY: No cough, shortness of breath, wheezing or hemoptysis.  CARDIOVASCULAR: No chest pain,  orthopnea, edema.  GASTROINTESTINAL: No nausea, vomiting, diarrhea or abdominal pain.  GENITOURINARY: No dysuria, hematuria.  ENDOCRINE: No polyuria, nocturia,  HEMATOLOGY: No anemia, easy bruising or bleeding SKIN: No rash or lesion. MUSCULOSKELETAL: left hip pain.   NEUROLOGIC: No tingling, numbness, weakness.  PSYCHIATRY: No anxiety or depression.   MEDICATIONS AT HOME:  Prior to Admission medications   Medication Sig Start Date End Date Taking? Authorizing Provider  amLODipine (NORVASC) 10 MG tablet Take 1 tablet (10 mg total) by mouth daily. 04/23/16  Yes Fritzi Mandes, MD  aspirin EC 81 MG tablet Take 81 mg by mouth every other day.   Yes [provider]  atorvastatin (LIPITOR) 80 MG tablet Take 1 tablet by mouth daily.   Yes [provider]  ferrous sulfate 325 (65 FE) MG tablet Take 325 mg by mouth daily with breakfast.   Yes [provider]  hydrALAZINE (APRESOLINE) 25 MG tablet Take 1 tablet (25 mg total) by mouth 4 (four) times daily. 04/23/16  Yes Fritzi Mandes, MD  losartan (COZAAR) 50 MG tablet Take 1 tablet by mouth daily. 03/08/16  Yes [provider]  Melatonin 3 MG TABS Take 1 tablet by mouth at bedtime.   Yes [provider]  metoprolol succinate (TOPROL-XL) 50 MG 24 hr tablet Take 1 tablet (50 mg total) by mouth daily. Take with or immediately following a meal. 04/23/16  Yes Fritzi Mandes, MD  Multiple Vitamin (MULTI-VITAMINS) TABS Take  1 tablet by mouth daily.   Yes [provider]  oxyCODONE-acetaminophen (PERCOCET/ROXICET) 5-325 MG tablet Take 1 tablet by mouth every 6 (six) hours as needed for severe pain. 09/10/16  Yes Sainani, Belia Heman, MD  polyethylene glycol (MIRALAX / GLYCOLAX) packet Take 17 g by mouth daily.   Yes [provider]  senna (SENOKOT) 8.6 MG tablet Take 2 tablets by mouth 2 (two) times daily.    Yes [provider]  vitamin B-12 (CYANOCOBALAMIN) 1000 MCG tablet Take 1,000 mcg by mouth  daily.   Yes [provider]  Vitamin D, Cholecalciferol, 1000 units CAPS Take 1 capsule by mouth daily.   Yes [provider]  acetaminophen (TYLENOL) 325 MG tablet Take 2 tablets (650 mg total) by mouth every 6 (six) hours as needed for mild pain. 09/10/16   Henreitta Leber, MD  enoxaparin (LOVENOX) 30 MG/0.3ML injection Inject 0.3 mLs (30 mg total) into the skin daily. 09/10/16 09/24/16  Henreitta Leber, MD      PHYSICAL EXAMINATION:   VITAL SIGNS: Blood pressure (!) 175/68, pulse 93, temperature 98.5 F (36.9 C), temperature source Oral, resp. rate 14, height 5\' 4"  (1.626 m), weight 59 kg (130 lb), SpO2 97 %.  GENERAL:  83 y.o.-year-old patient lying in the bed with no acute distress.  EYES: Pupils equal, round, reactive to light and accommodation. No scleral icterus. Extraocular muscles intact.  HEENT: Head atraumatic, normocephalic. Oropharynx and nasopharynx clear.  NECK:  Supple, no jugular venous distention. No thyroid enlargement, no tenderness.  LUNGS: Normal breath sounds bilaterally, no wheezing, rales,rhonchi or crepitation. No use of accessory muscles of respiration.  CARDIOVASCULAR: S1, S2 normal. No murmurs, rubs, or gallops.  ABDOMEN: Soft, nontender, nondistended. Bowel sounds present. No organomegaly or mass.  EXTREMITIES: No pedal edema, cyanosis, or clubbing.  NEUROLOGIC: Cranial nerves II through XII are intact. Muscle strength 5/5 in all extremities except left lower extremity which she is not moving due to pain. Sensation intact. Gait not checked.  PSYCHIATRIC: The patient is alert and oriented x 3.  SKIN: No obvious rash, lesion, or ulcer.   LABORATORY PANEL:   CBC  Recent Labs Lab 11/02/16 1531  WBC 10.4  HGB 12.2  HCT 36.7  PLT 347  MCV 98.7  MCH 32.8  MCHC 33.3  RDW 14.3   ------------------------------------------------------------------------------------------------------------------  Chemistries   Recent Labs Lab  11/02/16 1531  NA 136  K 4.3  CL 104  CO2 23  GLUCOSE 122*  BUN 17  CREATININE 1.21*  CALCIUM 10.7*   ------------------------------------------------------------------------------------------------------------------ estimated creatinine clearance is 24.5 mL/min (A) (by C-G formula based on SCr of 1.21 mg/dL (H)). ------------------------------------------------------------------------------------------------------------------ No results for input(s): TSH, T4TOTAL, T3FREE, THYROIDAB in the last 72 hours.  Invalid input(s): FREET3   Coagulation profile  Recent Labs Lab 11/02/16 1531  INR 0.86   ------------------------------------------------------------------------------------------------------------------- No results for input(s): DDIMER in the last 72 hours. -------------------------------------------------------------------------------------------------------------------  Cardiac Enzymes No results for input(s): CKMB, TROPONINI, MYOGLOBIN in the last 168 hours.  Invalid input(s): CK ------------------------------------------------------------------------------------------------------------------ Invalid input(s): POCBNP  ---------------------------------------------------------------------------------------------------------------  Urinalysis    Component Value Date/Time   COLORURINE YELLOW (A) 09/08/2016 0500   APPEARANCEUR CLEAR (A) 09/08/2016 0500   APPEARANCEUR Cloudy 05/30/2011 1042   LABSPEC 1.017 09/08/2016 0500   LABSPEC 1.013 05/30/2011 1042   PHURINE 5.0 09/08/2016 0500   GLUCOSEU NEGATIVE 09/08/2016 0500   GLUCOSEU Negative 05/30/2011 1042   HGBUR NEGATIVE 09/08/2016 0500   BILIRUBINUR NEGATIVE 09/08/2016 0500   BILIRUBINUR Negative  05/30/2011 1042   [place] 09/08/2016 0500   PROTEINUR 30 (A) 09/08/2016 0500   NITRITE NEGATIVE 09/08/2016 0500   LEUKOCYTESUR NEGATIVE 09/08/2016 0500   LEUKOCYTESUR 3+ 05/30/2011 1042      RADIOLOGY: Dg Hip Unilat With Pelvis 2-3 Views Left  Result Date: 11/02/2016 CLINICAL DATA:  Left leg pain. EXAM: DG HIP (WITH OR WITHOUT PELVIS) 2-3V LEFT COMPARISON:  09/07/2016 FINDINGS: The left hip prosthesis is dislocated posteriorly and superiorly. No definite fracture. The right hip is normally located. The pubic symphysis and SI joints are intact. No definite pelvic fractures. IMPRESSION: Posterosuperior dislocation of the left femoral prosthesis. Electronically Signed   By: Marijo Sanes M.D.   On: 11/02/2016 16:04    EKG: Orders placed or performed during the hospital encounter of 11/02/16  . EKG 12-Lead  . EKG 12-Lead    IMPRESSION AND PLAN:  * Left hip dislocation.   Keep on oxycodone for pain and nothing by mouth post midnight for surgery tomorrow   Patient is optimized with moderate risk due to old age but she is stable to go for surgery for  * Hypertension   Continue home meds, she is on multiple medications at home I would like to hold losartan here and continue others.  * Hyperlipidemia   Continue atorvastatin.  * Gastroesophageal reflux disease   Continue pantoprazole.  All the records are reviewed and case discussed with ED provider. Management plans discussed with the patient, family and they are in agreement.  CODE STATUS: DO NOT RESUSCITATE. Code Status History    Date Active Date Inactive Code Status Order ID Comments User Context   09/07/2016  7:36 AM 09/10/2016  6:06 PM DNR [id number]  Thornton Park, MD Inpatient   09/07/2016  3:17 AM 09/07/2016  7:36 AM DNR [id number]  Melvern Sample, RN Inpatient   09/07/2016  3:00 AM 09/07/2016  3:17 AM Full Code [id number]  Hugelmeyer, Mineral, DO Inpatient   09/07/2016  2:59 AM 09/07/2016  3:00 AM DNR [id number]  Harvie Bridge, DO Inpatient   04/20/2016  6:54 PM 04/23/2016  4:13 PM Full Code [id number]  Hower, Aaron Mose, MD ED    Questions for Most Recent Historical Code Status (Order [id number])    Question  Answer Comment   In the event of cardiac or respiratory ARREST Do not call a "code blue"    In the event of cardiac or respiratory ARREST Do not perform Intubation, CPR, defibrillation or ACLS    In the event of cardiac or respiratory ARREST Use medication by any route, position, wound care, and other measures to relive pain and suffering. May use oxygen, suction and manual treatment of airway obstruction as needed for comfort.         Advance Directive Documentation     Most Recent Value  Type of Advance Directive  Healthcare Power of Attorney  Pre-existing out of facility DNR order (yellow form or pink MOST form)  -  "MOST" Form in Place?  -       TOTAL TIME TAKING CARE OF THIS PATIENT: 50 minutes.    Vaughan Basta M.D on 11/02/2016   Between 7am to 6pm - Pager - [phone]  After 6pm go to [email or web address] - password [email or web address] [facility]  Office  [phone]  CC: Primary care physician; Venia Carbon, MD   Note: This dictation was prepared with Dragon dictation along with smaller phrase technology. Any transcriptional errors that result from this process are unintentional.

## 2016-11-02 NOTE — ED Triage Notes (Signed)
Patient from [facility] via [place]. Patient was seen by physical therapy this morning and unable to stand or put weight on left hip. Shortening and rotation noted to left leg. Patient had left hip replacement in April. Patient denies fall or injury. Patient alert and oriented x4.

## 2016-11-02 NOTE — Consult Note (Signed)
ORTHOPAEDIC CONSULTATION  REQUESTING PHYSICIAN: Delman Kitten, MD  Chief Complaint: Left hemiarthroplasty dislocation  HPI: Jacqueline Sanchez is a 83 y.o. female who complains of  left hip pain.  She does not recall any recent injury. Physical therapist at [facility] noted patient was unable to bear weight on the left hip which is a clinical change. When he the left lower extremity is noted. She isn't [facility]. X-rays have revealed a dislocation to the left hip hemiarthroplasty prosthesis.  Past Medical History:  Diagnosis Date  . Aphasia 04/2016   Transient---??TIA  . Asthma   . CKD (chronic kidney disease)   . Edema   . Femur fracture, left ([place]) 09/06/2016  . GERD (gastroesophageal reflux disease)   . Hyperlipidemia   . Hypertension   . Osteoporosis    Past Surgical History:  Procedure Laterality Date  . HIP ARTHROPLASTY Left 09/07/2016   Procedure: ARTHROPLASTY BIPOLAR HIP (HEMIARTHROPLASTY);  Surgeon: Thornton Park, MD;  Location: [facility] ORS;  Service: Orthopedics;  Laterality: Left;  . KYPHOPLASTY     Social History   Social History  . Marital status: Widowed    Spouse name: N/A  . Number of children: 2  . Years of education: N/A   Occupational History  . 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 Smoking status: Never Smoker  . Smokeless tobacco: Never Used  . Alcohol use No  . Drug use: Unknown  . Sexual activity: No   Other Topics Concern  . None   Social History Narrative   Widowed 2000   Has living will   Sons/DILs are health care POAs (mostly Mike/Jan)   Has DNR   No tube feeds if cognitively unaware   Family History  Problem Relation Age of Onset  . Hypertension Other    Allergies  Allergen Reactions  . Morphine And Related Nausea Only   Prior to Admission medications   Medication Sig Start Date End Date Taking? Authorizing Provider  amLODipine (NORVASC) 10 MG tablet Take 1 tablet (10 mg total) by  mouth daily. 04/23/16  Yes Fritzi Mandes, MD  aspirin EC 81 MG tablet Take 81 mg by mouth every other day.   Yes [provider]  atorvastatin (LIPITOR) 80 MG tablet Take 1 tablet by mouth daily.   Yes [provider]  ferrous sulfate 325 (65 FE) MG tablet Take 325 mg by mouth daily with breakfast.   Yes [provider]  hydrALAZINE (APRESOLINE) 25 MG tablet Take 1 tablet (25 mg total) by mouth 4 (four) times daily. 04/23/16  Yes Fritzi Mandes, MD  losartan (COZAAR) 50 MG tablet Take 1 tablet by mouth daily. 03/08/16  Yes [provider]  Melatonin 3 MG TABS Take 1 tablet by mouth at bedtime.   Yes [provider]  metoprolol succinate (TOPROL-XL) 50 MG 24 hr tablet Take 1 tablet (50 mg total) by mouth daily. Take with or immediately following a meal. 04/23/16  Yes Fritzi Mandes, MD  Multiple Vitamin (MULTI-VITAMINS) TABS Take 1 tablet by mouth daily.   Yes [provider]  oxyCODONE-acetaminophen (PERCOCET/ROXICET) 5-325 MG tablet Take 1 tablet by mouth every 6 (six) hours as needed for severe pain. 09/10/16  Yes Sainani, Belia Heman, MD  polyethylene glycol (MIRALAX / GLYCOLAX) packet Take 17 g by mouth daily.   Yes [provider]  senna (SENOKOT) 8.6 MG tablet Take 2 tablets by mouth 2 (two) times daily.    Yes  [provider]  vitamin B-12 (CYANOCOBALAMIN) 1000 MCG tablet Take 1,000 mcg by mouth daily.   Yes [provider]  Vitamin D, Cholecalciferol, 1000 units CAPS Take 1 capsule by mouth daily.   Yes [provider]  acetaminophen (TYLENOL) 325 MG tablet Take 2 tablets (650 mg total) by mouth every 6 (six) hours as needed for mild pain. 09/10/16   Henreitta Leber, MD  enoxaparin (LOVENOX) 30 MG/0.3ML injection Inject 0.3 mLs (30 mg total) into the skin daily. 09/10/16 09/24/16  Henreitta Leber, MD   Dg Hip Unilat With Pelvis 2-3 Views Left  Result Date: 11/02/2016 CLINICAL DATA:  Left leg pain. EXAM: DG HIP (WITH OR  WITHOUT PELVIS) 2-3V LEFT COMPARISON:  09/07/2016 FINDINGS: The left hip prosthesis is dislocated posteriorly and superiorly. No definite fracture. The right hip is normally located. The pubic symphysis and SI joints are intact. No definite pelvic fractures. IMPRESSION: Posterosuperior dislocation of the left femoral prosthesis. Electronically Signed   By: Marijo Sanes M.D.   On: 11/02/2016 16:04    Positive ROS: All other systems have been reviewed and were otherwise negative with the exception of those mentioned in the HPI and as above.  Physical Exam: General: Alert, no acute distress  MUSCULOSKELETAL: Left lower extremity: Patient's incision is clean dry and intact. Patient has shortening and internal rotation of the left lower extremity. She is shortened by approximately 2-3 cm. Patient has intact sensation to light touch throughout the left lower extremity. She has intact motor function as well as can flex and extend her toes and dorsiflex and plantarflex her ankle. She has palpable pedal pulses.  Assessment: Left hip hemiarthroplasty dislocation  Plan: I saw the patient in the ER. I explained to her that her hip prosthesis had dislocated. She is unaware of any recent injury.  I have recommended a closed reduction of her left hip in the OR. The OR currently is busy with the vascular surgery case. I'm therefore going to add her onto my or schedule for tomorrow morning. Patient is neurovascularly intact.  Recommend a management with oxycodone that she takes at baseline.    Thornton Park, MD    11/02/2016 5:18 PM

## 2016-11-02 NOTE — ED Notes (Signed)
Patient reports taking 1 percocet at approximately 1330 for pain.

## 2016-11-02 NOTE — ED Provider Notes (Signed)
[facility] Emergency Department Provider Note   ____________________________________________   First MD Initiated Contact with Patient 11/02/16 1554     (approximate)  I have reviewed the triage vital signs and the nursing notes.   HISTORY  Chief Complaint Hip Pain  HPI Jacqueline Sanchez is a 83 y.o. female here for evaluation of left hip pain  Patient got up today, was noticing pain in her left hip more than usual with physical therapy. They noted her left leg was not working well, and it seems shorter than normal. She reports significant pain in the left hip when she goes to move or attempted to stand on it, but reports she is relatively comfortable when sitting still.  She had a new hip but in a couple months ago by Dr. Mack Guise. She has been doing well since that time  She did not fall or sustain any injury. She reports she got up and use the bathroom last night without incident or hip pain noted    Past Medical History:  Diagnosis Date  . Aphasia 04/2016   Transient---??TIA  . Asthma   . CKD (chronic kidney disease)   . Edema   . Femur fracture, left ([place]) 09/06/2016  . GERD (gastroesophageal reflux disease)   . Hyperlipidemia   . Hypertension   . Osteoporosis     Patient Active Problem List   Diagnosis Date Noted  . HLD (hyperlipidemia) 10/12/2016  . Fracture of femoral neck, left ([place]) 09/07/2016  . Essential hypertension, benign 06/23/2016  . Generalized osteoarthritis of multiple sites 06/23/2016  . Sleep disturbance 06/23/2016  . Aphasia 04/21/2016  . CKD (chronic kidney disease), stage III 04/21/2016    Past Surgical History:  Procedure Laterality Date  . HIP ARTHROPLASTY Left 09/07/2016   Procedure: ARTHROPLASTY BIPOLAR HIP (HEMIARTHROPLASTY);  Surgeon: Thornton Park, MD;  Location: [facility] ORS;  Service: Orthopedics;  Laterality: Left;  . KYPHOPLASTY      Prior to Admission medications   Medication Sig Start Date End  Date Taking? Authorizing Provider  amLODipine (NORVASC) 10 MG tablet Take 1 tablet (10 mg total) by mouth daily. 04/23/16  Yes Fritzi Mandes, MD  aspirin EC 81 MG tablet Take 81 mg by mouth every other day.   Yes [provider]  atorvastatin (LIPITOR) 80 MG tablet Take 1 tablet by mouth daily.   Yes [provider]  ferrous sulfate 325 (65 FE) MG tablet Take 325 mg by mouth daily with breakfast.   Yes [provider]  hydrALAZINE (APRESOLINE) 25 MG tablet Take 1 tablet (25 mg total) by mouth 4 (four) times daily. 04/23/16  Yes Fritzi Mandes, MD  losartan (COZAAR) 50 MG tablet Take 1 tablet by mouth daily. 03/08/16  Yes [provider]  Melatonin 3 MG TABS Take 1 tablet by mouth at bedtime.   Yes [provider]  metoprolol succinate (TOPROL-XL) 50 MG 24 hr tablet Take 1 tablet (50 mg total) by mouth daily. Take with or immediately following a meal. 04/23/16  Yes Fritzi Mandes, MD  Multiple Vitamin (MULTI-VITAMINS) TABS Take 1 tablet by mouth daily.   Yes [provider]  oxyCODONE-acetaminophen (PERCOCET/ROXICET) 5-325 MG tablet Take 1 tablet by mouth every 6 (six) hours as needed for severe pain. 09/10/16  Yes Sainani, Belia Heman, MD  polyethylene glycol (MIRALAX / GLYCOLAX) packet Take 17 g by mouth daily.   Yes [provider]  senna (SENOKOT) 8.6 MG tablet Take 2 tablets by mouth 2 (two) times daily.  Yes [provider]  vitamin B-12 (CYANOCOBALAMIN) 1000 MCG tablet Take 1,000 mcg by mouth daily.   Yes [provider]  Vitamin D, Cholecalciferol, 1000 units CAPS Take 1 capsule by mouth daily.   Yes [provider]  acetaminophen (TYLENOL) 325 MG tablet Take 2 tablets (650 mg total) by mouth every 6 (six) hours as needed for mild pain. 09/10/16   Henreitta Leber, MD  enoxaparin (LOVENOX) 30 MG/0.3ML injection Inject 0.3 mLs (30 mg total) into the skin daily. 09/10/16 09/24/16  Henreitta Leber, MD     Allergies Morphine and related  Family History  Problem Relation Age of Onset  . Hypertension Other     Social History Social History  Substance Use Topics  . Smoking status: Never Smoker  . Smokeless tobacco: Never Used  . Alcohol use No    Review of Systems Constitutional: No fever/chills Eyes: No visual changes. ENT: No sore throat. Cardiovascular: Denies chest pain. Respiratory: Denies shortness of breath. Gastrointestinal: No abdominal pain.  No nausea, no vomiting.  No diarrhea.  No constipation. Genitourinary: Negative for dysuria. Musculoskeletal: Negative for back pain.No neck pain.  Skin: Negative for rash. Neurological: Negative for headaches, focal weakness or numbness.    ____________________________________________   PHYSICAL EXAM:  VITAL SIGNS: ED Triage Vitals  Enc Vitals Group     BP 11/02/16 1524 (!) 175/68     Pulse Rate 11/02/16 1524 93     Resp 11/02/16 1524 14     Temp 11/02/16 1524 98.5 F (36.9 C)     Temp Source 11/02/16 1524 Oral     SpO2 11/02/16 1524 97 %     Weight 11/02/16 1520 130 lb (59 kg)     Height 11/02/16 1520 5\' 4"  (1.626 m)     Head Circumference --      Peak Flow --      Pain Score 11/02/16 1520 7     Pain Loc --      Pain Edu? --      Excl. in [place]? --     Constitutional: Alert and oriented. Well appearing and in no acute distress. Eyes: Conjunctivae are normal. Head: Atraumatic. Nose: No congestion/rhinnorhea. Mouth/Throat: Mucous membranes are moist. Neck: No stridor.  No cervical tenderness.  Cardiovascular: Normal rate, regular rhythm. Grossly normal heart sounds.  Good peripheral circulation. Respiratory: Normal respiratory effort.  No retractions. Lungs CTAB. Gastrointestinal: Soft and nontender. No distention. Musculoskeletal: No lower extremity tenderness nor edema.Strong peripheral pulses bilaterally. The patient's left lower leg is notably shortened and externally rotated. Significant pain in the  hip joint with minimal range of motion.  Neurologic:  Normal speech and language. No gross focal neurologic deficits are appreciated.  Skin:  Skin is warm, dry and intact. No rash noted. Psychiatric: Mood and affect are normal. Speech and behavior are normal.  ____________________________________________   LABS (all labs ordered are listed, but only abnormal results are displayed)  Labs Reviewed  CBC - Abnormal; Notable for the following:       Result Value   RBC 3.72 (*)    All other components within normal limits  PROTIME-INR  APTT  BASIC METABOLIC PANEL   ____________________________________________  EKG  Reviewed and interpreted by me at 1525 Normal sinus rhythm, heart rate 90 QRS 90 QTc 4:30 PR interval 200 Normal sinus rhythm, probable left ventricular hypertrophy without ischemia noted  ____________________________________________  RADIOLOGY  Dg Hip Unilat With Pelvis 2-3 Views Left  Result Date: 11/02/2016 CLINICAL  DATA:  Left leg pain. EXAM: DG HIP (WITH OR WITHOUT PELVIS) 2-3V LEFT COMPARISON:  09/07/2016 FINDINGS: The left hip prosthesis is dislocated posteriorly and superiorly. No definite fracture. The right hip is normally located. The pubic symphysis and SI joints are intact. No definite pelvic fractures. IMPRESSION: Posterosuperior dislocation of the left femoral prosthesis. Electronically Signed   By: Marijo Sanes M.D.   On: 11/02/2016 16:04    ____________________________________________   PROCEDURES  Procedure(s) performed: None  Procedures  Critical Care performed: No  ____________________________________________   INITIAL IMPRESSION / ASSESSMENT AND PLAN / ED COURSE  Pertinent labs & imaging results that were available during my care of the patient were reviewed by me and considered in my medical decision making (see chart for details).  Atraumatic left hip pain. No reported trauma or injury. Appears likely some type of dislocation  probably occurring at some point during the early morning or evening without obvious fall or injury.  Clinical Course as of Nov 03 1802  Wed Nov 02, 2016  1557 DG Hip Unilat With Pelvis 2-3 Views Left [MQ]    Clinical Course User Index [MQ] Delman Kitten, MD   Discussed case with Dr. Mack Guise, advises patient to be admitted and she must be made nothing by mouth at midnight.  ____________________________________________   FINAL CLINICAL IMPRESSION(S) / ED DIAGNOSES  Final diagnoses:  Closed dislocation of left hip, initial encounter [facility])      NEW MEDICATIONS STARTED DURING THIS VISIT:  New Prescriptions   No medications on file     Note:  This document was prepared using Dragon voice recognition software and may include unintentional dictation errors.     Delman Kitten, MD 11/02/16 [phone]

## 2016-11-03 ENCOUNTER — Encounter
Admission: EM | Disposition: A | Discharge status: Skilled Nursing Facility | Payer: Self-pay | Source: Home / Self Care | Attending: Internal Medicine

## 2016-11-03 ENCOUNTER — Inpatient Hospital Stay: Payer: Medicare Other | Admitting: Certified Registered"

## 2016-11-03 ENCOUNTER — Inpatient Hospital Stay: Payer: Medicare Other

## 2016-11-03 HISTORY — PX: HIP CLOSED REDUCTION: SHX983

## 2016-11-03 LAB — CBC
HCT: 36.9 % (ref 35.0–47.0)
HEMOGLOBIN: 12.3 g/dL (ref 12.0–16.0)
MCH: 32.8 pg (ref 26.0–34.0)
MCHC: 33.2 g/dL (ref 32.0–36.0)
MCV: 98.6 fL (ref 80.0–100.0)
PLATELETS: 352 10*3/uL (ref 150–440)
RBC: 3.75 MIL/uL — AB (ref 3.80–5.20)
RDW: 14.2 % (ref 11.5–14.5)
WBC: 12.6 10*3/uL — ABNORMAL HIGH (ref 3.6–11.0)

## 2016-11-03 LAB — BASIC METABOLIC PANEL
ANION GAP: 8 (ref 5–15)
BUN: 17 mg/dL (ref 6–20)
CALCIUM: 10.5 mg/dL — AB (ref 8.9–10.3)
CO2: 27 mmol/L (ref 22–32)
CREATININE: 1.13 mg/dL — AB (ref 0.44–1.00)
Chloride: 102 mmol/L (ref 101–111)
GFR, EST AFRICAN AMERICAN: 47 mL/min — AB (ref >60)
GFR, EST NON AFRICAN AMERICAN: 40 mL/min — AB (ref >60)
Glucose, Bld: 109 mg/dL — ABNORMAL HIGH (ref 65–99)
Potassium: 4.1 mmol/L (ref 3.5–5.1)
Sodium: 137 mmol/L (ref 135–145)

## 2016-11-03 LAB — SURGICAL PCR SCREEN
MRSA, PCR: NEGATIVE
STAPHYLOCOCCUS AUREUS: NEGATIVE

## 2016-11-03 SURGERY — CLOSED REDUCTION, HIP
Anesthesia: General | Laterality: Left

## 2016-11-03 MED ORDER — METHOCARBAMOL 1000 MG/10ML IJ SOLN
500.0000 mg | Freq: Four times a day (QID) | INTRAVENOUS | Status: DC | PRN
  Filled 2016-11-03: qty 5

## 2016-11-03 MED ORDER — ALUM & MAG HYDROXIDE-SIMETH 200-200-20 MG/5ML PO SUSP: 30.0000 mL | ORAL | Status: DC | PRN

## 2016-11-03 MED ORDER — ONDANSETRON HCL 4 MG PO TABS: 4.0000 mg | ORAL_TABLET | Freq: Four times a day (QID) | ORAL | Status: DC | PRN

## 2016-11-03 MED ORDER — BISACODYL 10 MG RE SUPP
10.0000 mg | Freq: Every day | RECTAL | Status: DC | PRN
  Filled 2016-11-03: qty 1

## 2016-11-03 MED ORDER — LOSARTAN POTASSIUM 50 MG PO TABS
50.0000 mg | ORAL_TABLET | Freq: Every day | ORAL | Status: DC
  Administered 2016-11-03 – 2016-11-04 (×2): 50 mg via ORAL
  Filled 2016-11-03 (×3): qty 1

## 2016-11-03 MED ORDER — ONDANSETRON HCL 4 MG/2ML IJ SOLN: 4.0000 mg | Freq: Once | INTRAMUSCULAR | Status: DC | PRN

## 2016-11-03 MED ORDER — FENTANYL CITRATE (PF) 100 MCG/2ML IJ SOLN: 25.0000 ug | INTRAMUSCULAR | Status: DC | PRN

## 2016-11-03 MED ORDER — ENOXAPARIN SODIUM 30 MG/0.3ML ~~LOC~~ SOLN
30.0000 mg | SUBCUTANEOUS | Status: DC
  Administered 2016-11-04 – 2016-11-05 (×2): 30 mg via SUBCUTANEOUS
  Filled 2016-11-03 (×2): qty 0.3

## 2016-11-03 MED ORDER — SODIUM CHLORIDE 0.9 % IV SOLN
75.0000 mL/h | INTRAVENOUS | Status: DC
  Administered 2016-11-03: 75 mL/h via INTRAVENOUS

## 2016-11-03 MED ORDER — POLYETHYLENE GLYCOL 3350 17 G PO PACK: 17.0000 g | PACK | Freq: Every day | ORAL | Status: DC | PRN

## 2016-11-03 MED ORDER — LACTATED RINGERS IV SOLN
INTRAVENOUS | Status: DC
  Administered 2016-11-03 (×2): via INTRAVENOUS

## 2016-11-03 MED ORDER — METHOCARBAMOL 500 MG PO TABS: 500.0000 mg | ORAL_TABLET | Freq: Four times a day (QID) | ORAL | Status: DC | PRN

## 2016-11-03 MED ORDER — DOCUSATE SODIUM 100 MG PO CAPS
100.0000 mg | ORAL_CAPSULE | Freq: Two times a day (BID) | ORAL | Status: DC
  Administered 2016-11-03 – 2016-11-05 (×4): 100 mg via ORAL
  Filled 2016-11-03 (×4): qty 1

## 2016-11-03 MED ORDER — PROPOFOL 10 MG/ML IV BOLUS
INTRAVENOUS | Status: DC | PRN
  Administered 2016-11-03: 50 mg via INTRAVENOUS
  Administered 2016-11-03: 20 mg via INTRAVENOUS
  Administered 2016-11-03: 30 mg via INTRAVENOUS

## 2016-11-03 MED ORDER — MAGNESIUM CITRATE PO SOLN
1.0000 | Freq: Once | ORAL | Status: DC | PRN
  Filled 2016-11-03: qty 296

## 2016-11-03 MED ORDER — ONDANSETRON HCL 4 MG/2ML IJ SOLN: 4.0000 mg | Freq: Four times a day (QID) | INTRAMUSCULAR | Status: DC | PRN

## 2016-11-03 SURGICAL SUPPLY — 5 items
GLOVE BIOGEL PI IND STRL 9 (GLOVE) IMPLANT
GLOVE BIOGEL PI INDICATOR 9 (GLOVE)
GOWN STRL REUS TWL 2XL XL LVL4 (GOWN DISPOSABLE) IMPLANT
KIT RM TURNOVER STRD PROC AR (KITS) ×2 IMPLANT
PILLOW ABDUC SM (MISCELLANEOUS) ×2 IMPLANT

## 2016-11-03 NOTE — Progress Notes (Signed)
15 minute call to floor, Network engineer to notify RN.

## 2016-11-03 NOTE — Progress Notes (Addendum)
[place] at [place] NAME: Jacqueline Sanchez    MR#:  [id number]  DATE OF BIRTH:  04-Mar-1920  SUBJECTIVE:   No acute issues overnight. Patient is to go to the OR @ noon today  REVIEW OF SYSTEMS:    Review of Systems  Constitutional: Negative for fever, chills weight loss HENT: Negative for ear pain, nosebleeds, congestion, facial swelling, rhinorrhea, neck pain, neck stiffness and ear discharge.   Respiratory: Negative for cough, shortness of breath, wheezing  Cardiovascular: Negative for chest pain, palpitations and leg swelling.  Gastrointestinal: Negative for heartburn, abdominal pain, vomiting, diarrhea or consitpation Genitourinary: Negative for dysuria, urgency, frequency, hematuria Musculoskeletal:mild back pain from lying in bed and hip painNeurological: Negative for dizziness, seizures, syncope, focal weakness,  numbness and headaches.  Hematological: Does not bruise/bleed easily.  Psychiatric/Behavioral: Negative for hallucinations, confusion, dysphoric mood    Tolerating Diet: npo      DRUG ALLERGIES:   Allergies  Allergen Reactions  . Morphine And Related Nausea Only    VITALS:  Blood pressure (!) 173/55, pulse 91, temperature 99.6 F (37.6 C), temperature source Oral, resp. rate 18, height 5\' 4"  (1.626 m), weight 59 kg (130 lb), SpO2 96 %.  PHYSICAL EXAMINATION:  Constitutional: Appears well-developed and well-nourished. No distress. HENT: Normocephalic. Marland Kitchen Oropharynx is clear and moist.  Eyes: Conjunctivae and EOM are normal. PERRLA, no scleral icterus.  Neck: Normal ROM. Neck supple. No JVD. No tracheal deviation. CVS: RRR, S1/S2 +, 2/6 SEM, no gallops, no carotid bruit.  Pulmonary: Effort and breath sounds normal, no stridor, rhonchi, wheezes, rales.  Abdominal: Soft. BS +,  no distension, tenderness, rebound or guarding.  Musculoskeletal: Shortening of left lower extremity  Neuro: Alert. CN 2-12 grossly  intact. No focal deficits. Skin: Skin is warm and dry. No rash noted. Psychiatric: Normal mood and affect.      LABORATORY PANEL:   CBC  Recent Labs Lab 11/03/16 0529  WBC 12.6*  HGB 12.3  HCT 36.9  PLT 352   ------------------------------------------------------------------------------------------------------------------  Chemistries   Recent Labs Lab 11/03/16 0529  NA 137  K 4.1  CL 102  CO2 27  GLUCOSE 109*  BUN 17  CREATININE 1.13*  CALCIUM 10.5*   ------------------------------------------------------------------------------------------------------------------  Cardiac Enzymes No results for input(s): TROPONINI in the last 168 hours. ------------------------------------------------------------------------------------------------------------------  RADIOLOGY:  Dg Hip Unilat With Pelvis 2-3 Views Left  Result Date: 11/02/2016 CLINICAL DATA:  Left leg pain. EXAM: DG HIP (WITH OR WITHOUT PELVIS) 2-3V LEFT COMPARISON:  09/07/2016 FINDINGS: The left hip prosthesis is dislocated posteriorly and superiorly. No definite fracture. The right hip is normally located. The pubic symphysis and SI joints are intact. No definite pelvic fractures. IMPRESSION: Posterosuperior dislocation of the left femoral prosthesis. Electronically Signed   By: Marijo Sanes M.D.   On: 11/02/2016 16:04     ASSESSMENT AND PLAN:   83 year old female with history of intermittent asthma status post hemiarthroplasty in April 2018 who presents with left hip pain and found to have dislocation of hip prosthesis.  1. Left hip pain due to dislocation of left hip prosthesis: Plan to go to OR today Appreciate orthopedic surgery consultation DVT prophylaxis as per orthopedic surgery. Continue supportive approach with when necessary pain medications.   2. Accelerated Essential hypertension this am (AM meds not given yet): Continue Norvasc, hydralazine, losartan, metoprolol Monitor BP  3.  Hyperlipidemia: Continue atorvastatin  4. Anemia of chronic disease: Continue ferrous sulfate tablets  5. Acute kidney  injury: This is improved  Management plans discussed with the patient and she is in agreement.  CODE STATUS: dnr  TOTAL TIME TAKING CARE OF THIS PATIENT: 30 minutes.     POSSIBLE D/C tomorrow, DEPENDING ON CLINICAL CONDITION.   Jacqueline Sanchez M.D on 11/03/2016 at 8:28 AM  Between 7am to 6pm - Pager - [phone] After 6pm go to [email or web address] - password [email or web address] [place] [facility]  Office  [phone]  CC: Primary care physician; Venia Carbon, MD  Note: This dictation was prepared with Dragon dictation along with smaller phrase technology. Any transcriptional errors that result from this process are unintentional.

## 2016-11-03 NOTE — NC FL2 (Signed)
[place] LEVEL OF CARE SCREENING TOOL     IDENTIFICATION  Patient Name: Jacqueline Sanchez Birthdate: 30-Jun-1919 Sex: female Admission Date (Current Location): 11/02/2016  [place] and [place] Number:  Engineering geologist and Address:  [facility], [address], [place], [place] [address]      Provider Number: [id number]  Attending Physician Name and Address:  Bettey Costa, MD  Relative Name and Phone Number:       Current Level of Care: Hospital Recommended Level of Care: [place] Prior Approval Number:    Date Approved/Denied:   PASRR Number:  [id number] )  Discharge Plan: SNF    Current Diagnoses: Patient Active Problem List   Diagnosis Date Noted  . Hip dislocation, left ([place]) 11/02/2016  . HLD (hyperlipidemia) 10/12/2016  . Fracture of femoral neck, left ([place]) 09/07/2016  . Essential hypertension, benign 06/23/2016  . Generalized osteoarthritis of multiple sites 06/23/2016  . Sleep disturbance 06/23/2016  . Aphasia 04/21/2016  . CKD (chronic kidney disease), stage III 04/21/2016    Orientation RESPIRATION BLADDER Height & Weight     Self, Time, Situation, Place  Normal Continent Weight: 130 lb (59 kg) Height:  5\' 4"  (162.6 cm)  BEHAVIORAL SYMPTOMS/MOOD NEUROLOGICAL BOWEL NUTRITION STATUS   (none)  (none) Continent Diet (Diet: NPO to be advanced. )  AMBULATORY STATUS COMMUNICATION OF NEEDS Skin   Extensive Assist Verbally Surgical wounds                       Personal Care Assistance Level of Assistance  Bathing, Feeding, Dressing Bathing Assistance: Limited assistance Feeding assistance: Independent Dressing Assistance: Limited assistance     Functional Limitations Info  Sight, Hearing, Speech Sight Info: Adequate Hearing Info: Impaired Speech Info: Adequate    SPECIAL CARE FACTORS FREQUENCY  PT (By licensed PT), OT (By licensed OT)     PT Frequency:  (5) OT Frequency:  (5)            Contractures      Additional Factors Info  Code Status, Allergies Code Status Info:  (DNR ) Allergies Info:  (Morphine and Related )           Current Medications (11/03/2016):  This is the current hospital active medication list Current Facility-Administered Medications  Medication Dose Route Frequency Provider Last Rate Last Dose  . acetaminophen (TYLENOL) tablet 650 mg  650 mg Oral Q6H PRN Vaughan Basta, MD      . amLODipine (NORVASC) tablet 10 mg  10 mg Oral Daily Vaughan Basta, MD   10 mg at 11/03/16 0920  . aspirin EC tablet 81 mg  81 mg Oral Jonetta Speak, MD      . atorvastatin (LIPITOR) tablet 80 mg  80 mg Oral QPM Vaughan Basta, MD   80 mg at 11/02/16 2051  . cholecalciferol (VITAMIN D) tablet 1,000 Units  1,000 Units Oral Daily Vaughan Basta, MD      . docusate sodium (COLACE) capsule 100 mg  100 mg Oral BID PRN Vaughan Basta, MD      . ferrous sulfate tablet 325 mg  325 mg Oral Q breakfast Vaughan Basta, MD      . hydrALAZINE (APRESOLINE) tablet 25 mg  25 mg Oral Q8H Vaughan Basta, MD   25 mg at 11/03/16 0439  . losartan (COZAAR) tablet 50 mg  50 mg Oral Daily Bettey Costa, MD   50 mg at 11/03/16 0919  . Melatonin TABS  5 mg  5 mg Oral QHS Vaughan Basta, MD      . metoprolol succinate (TOPROL-XL) 24 hr tablet 50 mg  50 mg Oral Daily Vaughan Basta, MD   50 mg at 11/03/16 3382  . multivitamin with minerals tablet 1 tablet  1 tablet Oral Daily Vaughan Basta, MD      . oxyCODONE (Oxy IR/ROXICODONE) immediate release tablet 5-10 mg  5-10 mg Oral Q4H PRN Thornton Park, MD   10 mg at 11/03/16 0849  . oxyCODONE-acetaminophen (PERCOCET/ROXICET) 5-325 MG per tablet 1 tablet  1 tablet Oral Q6H PRN Vaughan Basta, MD      . pantoprazole (PROTONIX) EC tablet 40 mg  40 mg Oral Daily Vaughan Basta, MD   40 mg at 11/02/16 1936  . polyethylene glycol (MIRALAX /  GLYCOLAX) packet 17 g  17 g Oral Daily Vaughan Basta, MD      . senna (SENOKOT) tablet 17.2 mg  2 tablet Oral BID Vaughan Basta, MD   17.2 mg at 11/02/16 2211  . vitamin B-12 (CYANOCOBALAMIN) tablet 1,000 mcg  1,000 mcg Oral Daily Vaughan Basta, MD         Discharge Medications: Please see discharge summary for a list of discharge medications.  Relevant Imaging Results:  Relevant Lab Results:   Additional Information  (SSN: [id number])  Caulin Begley, Veronia Beets, LCSW

## 2016-11-03 NOTE — Transfer of Care (Signed)
Immediate Anesthesia Transfer of Care Note  Patient: Jacqueline Sanchez  Procedure(s) Performed: Procedure(s): CLOSED REDUCTION HIP (Left)  Patient Location: PACU  Anesthesia Type:General  Level of Consciousness: awake, alert  and oriented  Airway & Oxygen Therapy: Patient Spontanous Breathing  Post-op Assessment: Report given to RN and Post -op Vital signs reviewed and stable  Post vital signs: Reviewed  Last Vitals:  Vitals:   11/03/16 1223 11/03/16 1301  BP: (!) 157/58 110/77  Pulse: 95 84  Resp: 16 18  Temp: 37 C     Last Pain:  Vitals:   11/03/16 1223  TempSrc: Tympanic  PainSc: 6       Patients Stated Pain Goal: 3 (19/80/22 1798)  Complications: No apparent anesthesia complications

## 2016-11-03 NOTE — Progress Notes (Signed)
Subjective:  POST-OP CHECK:  Patient reports left hip pain as mild.  Patient has received her hip abduction brace.  Objective:   VITALS:   Vitals:   11/03/16 1600 11/03/16 1700 11/03/16 1805 11/03/16 1836  BP: (!) 148/51 (!) 166/65 (!) 171/69 (!) 145/57  Pulse: 85 97 95 86  Resp:      Temp:    99.1 F (37.3 C)  TempSrc:    Oral  SpO2:    96%  Weight:      Height:        PHYSICAL EXAM:  Left lower extremity:  Left hip abduction brace appears well fitting. Neurovascular intact Sensation intact distally Intact pulses distally Dorsiflexion/Plantar flexion intact No cellulitis present Compartment soft  LABS  Results for orders placed or performed during the hospital encounter of 11/02/16 (from the past 24 hour(s))  Surgical PCR screen     Status: None   Collection Time: 11/02/16  8:35 PM  Result Value Ref Range   MRSA, PCR NEGATIVE NEGATIVE   Staphylococcus aureus NEGATIVE NEGATIVE  Basic metabolic panel     Status: Abnormal   Collection Time: 11/03/16  5:29 AM  Result Value Ref Range   Sodium 137 135 - 145 mmol/L   Potassium 4.1 3.5 - 5.1 mmol/L   Chloride 102 101 - 111 mmol/L   CO2 27 22 - 32 mmol/L   Glucose, Bld 109 (H) 65 - 99 mg/dL   BUN 17 6 - 20 mg/dL   Creatinine, Ser 1.13 (H) 0.44 - 1.00 mg/dL   Calcium 10.5 (H) 8.9 - 10.3 mg/dL   GFR calc non Af Amer 40 (L) >60 mL/min   GFR calc Af Amer 47 (L) >60 mL/min   Anion gap 8 5 - 15  CBC     Status: Abnormal   Collection Time: 11/03/16  5:29 AM  Result Value Ref Range   WBC 12.6 (H) 3.6 - 11.0 K/uL   RBC 3.75 (L) 3.80 - 5.20 MIL/uL   Hemoglobin 12.3 12.0 - 16.0 g/dL   HCT 36.9 35.0 - 47.0 %   MCV 98.6 80.0 - 100.0 fL   MCH 32.8 26.0 - 34.0 pg   MCHC 33.2 32.0 - 36.0 g/dL   RDW 14.2 11.5 - 14.5 %   Platelets 352 150 - 440 K/uL    Dg Hip Port Unilat With Pelvis 1v Left  Result Date: 11/03/2016 CLINICAL DATA:  Postop left hip dislocation EXAM: DG HIP (WITH OR WITHOUT PELVIS) 1V PORT LEFT COMPARISON:   11/02/2016 FINDINGS: Reduction of left bipolar hip arthroplasty without evidence of postreduction hardware failure or fracture. IMPRESSION: Satisfactory reduction of left bipolar hip arthroplasty without post procedural hardware failure nor fracture. Alignment appears anatomic. Electronically Signed   By: Ashley Royalty M.D.   On: 11/03/2016 13:49   Dg Hip Operative Unilat W Or W/o Pelvis Left  Result Date: 11/03/2016 CLINICAL DATA:  Hip dislocation. EXAM: OPERATIVE LEFT HIP (WITH PELVIS IF PERFORMED) 2 VIEWS TECHNIQUE: Fluoroscopic spot image(s) were submitted for interpretation post-operatively. COMPARISON:  11/02/2016. FINDINGS: The LEFT hip is dislocated on preoperative films. Intraoperative films show satisfactory reduction. IMPRESSION: Satisfactory reduction of LEFT hip dislocation. Electronically Signed   By: Staci Righter M.D.   On: 11/03/2016 13:11   Dg Hip Unilat With Pelvis 2-3 Views Left  Result Date: 11/02/2016 CLINICAL DATA:  Left leg pain. EXAM: DG HIP (WITH OR WITHOUT PELVIS) 2-3V LEFT COMPARISON:  09/07/2016 FINDINGS: The left hip prosthesis is dislocated posteriorly and superiorly. No  definite fracture. The right hip is normally located. The pubic symphysis and SI joints are intact. No definite pelvic fractures. IMPRESSION: Posterosuperior dislocation of the left femoral prosthesis. Electronically Signed   By: Marijo Sanes M.D.   On: 11/02/2016 16:04    Assessment/Plan: Day of Surgery   Principal Problem:   Hip dislocation, left [facility])  Patient is doing well postop. Patient is a hip abduction brace. She may be evaluated by physical therapy tomorrow. Patient must observe posterior hip precautions. She may weight-bear as tolerated on left lower extremity.    Thornton Park , MD 11/03/2016, 6:58 PM

## 2016-11-03 NOTE — Anesthesia Preprocedure Evaluation (Signed)
Anesthesia Evaluation  Patient identified by MRN, date of birth, ID band Patient awake    Reviewed: Allergy & Precautions, NPO status , Patient's Chart, lab work & pertinent test results, reviewed documented beta blocker date and time   History of Anesthesia Complications Negative for: history of anesthetic complications  Airway Mallampati: III       Dental   Pulmonary neg pulmonary ROS, asthma (pt denies) ,           Cardiovascular hypertension, Pt. on medications and Pt. on home beta blockers      Neuro/Psych negative neurological ROS     GI/Hepatic Neg liver ROS,   Endo/Other  negative endocrine ROS  Renal/GU Renal InsufficiencyRenal disease     Musculoskeletal   Abdominal   Peds  Hematology   Anesthesia Other Findings   Reproductive/Obstetrics                             Anesthesia Physical Anesthesia Plan  ASA: II  Anesthesia Plan: General   Post-op Pain Management:    Induction: Intravenous  PONV Risk Score and Plan:   Airway Management Planned: Mask  Additional Equipment:   Intra-op Plan:   Post-operative Plan:   Informed Consent: I have reviewed the patients History and Physical, chart, labs and discussed the procedure including the risks, benefits and alternatives for the proposed anesthesia with the patient or authorized representative who has indicated his/her understanding and acceptance.     Plan Discussed with:   Anesthesia Plan Comments:         Anesthesia Quick Evaluation

## 2016-11-03 NOTE — Anesthesia Post-op Follow-up Note (Cosign Needed)
Anesthesia QCDR form completed.        

## 2016-11-03 NOTE — Clinical Social Work Note (Signed)
Clinical Social Work Assessment  Patient Details  Name: Jacqueline Sanchez MRN: [id number] Date of Birth: Jan 08, 1920  Date of referral:  11/03/16               Reason for consult:  Other (Comment Required) (From [facility] )                Permission sought to share information with:  Chartered certified accountant granted to share information::  Yes, Verbal Permission Granted  Name::      [facility]::     Relationship::     Contact Information:     Housing/Transportation Living arrangements for the past 2 months:  [place] of Information:  Patient Patient Interpreter Needed:  None Criminal Activity/Legal Involvement Pertinent to Current Situation/Hospitalization:  No - Comment as needed Significant Relationships:  Adult Children, Friend Lives with:  Facility Resident Do you feel safe going back to the place where you live?  Yes Need for family participation in patient care:  Yes (Comment)  Care giving concerns:  Patient is a resident at [facility].    Social Worker assessment / plan:  Holiday representative ([place]) reviewed chart and noted that patient is from [facility]. Per RN in progression rounds this morning patient has a hip dislocation and will go to the OR today around 12 noon. CSW met with patient and her friend/ neighbor Jacqueline Sanchez was at bedside prior to surgery today. Patient was alert and oriented X4 and was laying in the bed. CSW introduced self and explained role of CSW department. Patient reported that she is frustrated because she has been waiting for the Ortho MD since 5 am today. CSW provided support. Patient reported that she lives at [place] and has 2 sons and 2 daughter in laws that live in [place]. CSW explained that PT will evaluate patient after surgery and she made need to go to the SNF rehab side at [facility] for a little while. Patient verbalized her understanding. CSW contacted [facility] admissions  coordinator at [facility] and made her aware of above. Per Jacqueline Sanchez patient can go to the SNF side. FL2 complete and faxed out. CSW will continue to follow and assist as needed.   Employment status:  Retired Forensic scientist:  Medicare PT Recommendations:  Not assessed at this time [place] / Referral to community resources:  [place]  Patient/Family's Response to care:  Patient prefers to return to [facility]F but is open to going to the rehab side if needed.   Patient/Family's Understanding of and Emotional Response to Diagnosis, Current Treatment, and Prognosis:  Patient was very pleasant and thanked CSW for visit.   Emotional Assessment Appearance:  Appears stated age Attitude/Demeanor/Rapport:    Affect (typically observed):  Accepting, Adaptable, Pleasant Orientation:  Oriented to Self, Oriented to Place, Oriented to  Time, Oriented to Situation Alcohol / Substance use:  Not Applicable Psych involvement (Current and /or in the community):  No (Comment)  Discharge Needs  Concerns to be addressed:  Discharge Planning Concerns Readmission within the last 30 days:  No Current discharge risk:  Dependent with Mobility Barriers to Discharge:  Continued Medical Work up   [employer], Jacqueline Beets, LCSW 11/03/2016, 10:53 AM

## 2016-11-03 NOTE — Progress Notes (Signed)
Pt sent to short stay via hospital bed without incident per order for closed reduction of L dislocated hip. External female catheter d/ced on transfer to short stay. Prior to transfer, rept given to RN in short stay. No change in pt's AM assessment on transfer and pt pain controlled on transfer.

## 2016-11-03 NOTE — Anesthesia Postprocedure Evaluation (Signed)
Anesthesia Post Note  Patient: Jacqueline Sanchez  Procedure(s) Performed: Procedure(s) (LRB): CLOSED REDUCTION HIP (Left)  Patient location during evaluation: PACU Anesthesia Type: General Level of consciousness: awake and alert Pain management: pain level controlled Vital Signs Assessment: post-procedure vital signs reviewed and stable Respiratory status: spontaneous breathing and respiratory function stable Cardiovascular status: stable Anesthetic complications: no     Last Vitals:  Vitals:   11/03/16 1301 11/03/16 1302  BP: 110/77 110/77  Pulse: 84 82  Resp: 18 (!) 21  Temp:  36.7 C    Last Pain:  Vitals:   11/03/16 1223  TempSrc: Tympanic  PainSc: 6                  Milo Solana K

## 2016-11-03 NOTE — Clinical Social Work Placement (Signed)
   CLINICAL SOCIAL WORK PLACEMENT  NOTE  Date:  11/03/2016  Patient Details  Name: Jacqueline Sanchez MRN: [id number] Date of Birth: [birth year]  Clinical Social Work is seeking post-discharge placement for this patient at the [place] level of care (*CSW will initial, date and re-position this form in  chart as items are completed):  Yes   Patient/family provided with [place] Work Department's list of facilities offering this level of care within the geographic area requested by the patient (or if unable, by the patient's family).  Yes   Patient/family informed of their freedom to choose among providers that offer the needed level of care, that participate in Medicare, Medicaid or managed care program needed by the patient, have an available bed and are willing to accept the patient.  Yes   Patient/family informed of [place]'s ownership interest in [facility] and [facility], as well as of the fact that they are under no obligation to receive care at these facilities.  PASRR submitted to EDS on       PASRR number received on       Existing PASRR number confirmed on 11/03/16     FL2 transmitted to all facilities in geographic area requested by pt/family on 11/03/16     FL2 transmitted to all facilities within larger geographic area on       Patient informed that his/her managed care company has contracts with or will negotiate with certain facilities, including the following:        Yes   Patient/family informed of bed offers received.  Patient chooses bed at  [facility] )     Physician recommends and patient chooses bed at      Patient to be transferred to   on  .  Patient to be transferred to facility by       Patient family notified on   of transfer.  Name of family member notified:        PHYSICIAN       Additional Comment:    _______________________________________________ Darsh Vandevoort, Veronia Beets, LCSW 11/03/2016,  10:52 AM

## 2016-11-03 NOTE — Progress Notes (Signed)
New Admit  Arrival Method: From ED, EMS [facility] Mental Orientation: A&OX4 Telemetry: No Assessment:  Skin: Generalized moles and echymosis. Iv: 22 G L FA Pain: 5/10 medication administered Safety Measures: Yellow socks, fall risk sign, Bed alarm on, Rm near nurses station, active listening. Admission: Complete 1A Orientation: Complete MRSA negative and Consent signed

## 2016-11-03 NOTE — Progress Notes (Signed)
Subjective:  Patient reports left hip pain as mild to moderate.    Objective:   VITALS:   Vitals:   11/03/16 0434 11/03/16 0532 11/03/16 0743 11/03/16 1126  BP: (!) 180/69 (!) 175/68 (!) 173/55 (!) 162/54  Pulse: 95 94 91 88  Resp: 16  18 16   Temp: 98.7 F (37.1 C)  99.6 F (37.6 C) 99.2 F (37.3 C)  TempSrc: Oral  Oral Oral  SpO2: 95%  96% 97%  Weight:      Height:        PHYSICAL EXAM:  Left lower extremity: Neurovascular intact Sensation intact distally Intact pulses distally Dorsiflexion/Plantar flexion intact Incision: no drainage No cellulitis present Compartment soft  LABS  Results for orders placed or performed during the hospital encounter of 11/02/16 (from the past 24 hour(s))  CBC     Status: Abnormal   Collection Time: 11/02/16  3:31 PM  Result Value Ref Range   WBC 10.4 3.6 - 11.0 K/uL   RBC 3.72 (L) 3.80 - 5.20 MIL/uL   Hemoglobin 12.2 12.0 - 16.0 g/dL   HCT 36.7 35.0 - 47.0 %   MCV 98.7 80.0 - 100.0 fL   MCH 32.8 26.0 - 34.0 pg   MCHC 33.3 32.0 - 36.0 g/dL   RDW 14.3 11.5 - 14.5 %   Platelets 347 150 - 440 K/uL  Basic metabolic panel     Status: Abnormal   Collection Time: 11/02/16  3:31 PM  Result Value Ref Range   Sodium 136 135 - 145 mmol/L   Potassium 4.3 3.5 - 5.1 mmol/L   Chloride 104 101 - 111 mmol/L   CO2 23 22 - 32 mmol/L   Glucose, Bld 122 (H) 65 - 99 mg/dL   BUN 17 6 - 20 mg/dL   Creatinine, Ser 1.21 (H) 0.44 - 1.00 mg/dL   Calcium 10.7 (H) 8.9 - 10.3 mg/dL   GFR calc non Af Amer 37 (L) >60 mL/min   GFR calc Af Amer 43 (L) >60 mL/min   Anion gap 9 5 - 15  Protime-INR     Status: None   Collection Time: 11/02/16  3:31 PM  Result Value Ref Range   Prothrombin Time 11.7 11.4 - 15.2 seconds   INR 0.86   APTT     Status: None   Collection Time: 11/02/16  3:31 PM  Result Value Ref Range   aPTT 27 24 - 36 seconds  Surgical PCR screen     Status: None   Collection Time: 11/02/16  8:35 PM  Result Value Ref Range   MRSA, PCR  NEGATIVE NEGATIVE   Staphylococcus aureus NEGATIVE NEGATIVE  Basic metabolic panel     Status: Abnormal   Collection Time: 11/03/16  5:29 AM  Result Value Ref Range   Sodium 137 135 - 145 mmol/L   Potassium 4.1 3.5 - 5.1 mmol/L   Chloride 102 101 - 111 mmol/L   CO2 27 22 - 32 mmol/L   Glucose, Bld 109 (H) 65 - 99 mg/dL   BUN 17 6 - 20 mg/dL   Creatinine, Ser 1.13 (H) 0.44 - 1.00 mg/dL   Calcium 10.5 (H) 8.9 - 10.3 mg/dL   GFR calc non Af Amer 40 (L) >60 mL/min   GFR calc Af Amer 47 (L) >60 mL/min   Anion gap 8 5 - 15  CBC     Status: Abnormal   Collection Time: 11/03/16  5:29 AM  Result Value Ref Range   WBC  12.6 (H) 3.6 - 11.0 K/uL   RBC 3.75 (L) 3.80 - 5.20 MIL/uL   Hemoglobin 12.3 12.0 - 16.0 g/dL   HCT 36.9 35.0 - 47.0 %   MCV 98.6 80.0 - 100.0 fL   MCH 32.8 26.0 - 34.0 pg   MCHC 33.2 32.0 - 36.0 g/dL   RDW 14.2 11.5 - 14.5 %   Platelets 352 150 - 440 K/uL    Dg Hip Unilat With Pelvis 2-3 Views Left  Result Date: 11/02/2016 CLINICAL DATA:  Left leg pain. EXAM: DG HIP (WITH OR WITHOUT PELVIS) 2-3V LEFT COMPARISON:  09/07/2016 FINDINGS: The left hip prosthesis is dislocated posteriorly and superiorly. No definite fracture. The right hip is normally located. The pubic symphysis and SI joints are intact. No definite pelvic fractures. IMPRESSION: Posterosuperior dislocation of the left femoral prosthesis. Electronically Signed   By: Marijo Sanes M.D.   On: 11/02/2016 16:04    Assessment/Plan: Day of Surgery   Principal Problem:   Hip dislocation, left [facility])   Patient has a dislocated left hip hemiarthroplasty approximately 2 months following surgery for femoral neck hip fracture. It is unknown how this occurred. There has been no report of injury. Patient is scheduled for a closed reduction in the OR today and will have a hip abduction brace ordered postop.  I explained the procedure to the patient as well as the risks and benefits. Patient understood and wished to  proceed.   Thornton Park , MD 11/03/2016, 12:22 PM

## 2016-11-03 NOTE — Op Note (Signed)
11/02/2016 - 11/03/2016  1:05 PM  PATIENT:  Jacqueline Sanchez    PRE-OPERATIVE DIAGNOSIS:  left hip hemiarthroplasty dislocation   POST-OPERATIVE DIAGNOSIS:  Same  PROCEDURE:  CLOSED REDUCTION LEFT HIP HEMIARTHROPLASTY PROSTHESIS  SURGEON:  Thornton Park, MD  ANESTHESIA:   General  PREOPERATIVE INDICATIONS:  [name] is a  83 y.o. female with a diagnosis of left hemiarthroplasty dislocation  who failed conservative measures and elected for surgical management.    I discussed the risks and benefits of surgery. The risks include but are not limited to infection, bleeding requiring blood transfusion, nerve or blood vessel injury, joint stiffness or loss of motion, persistent pain, weakness or instability, malunion, nonunion and hardware failure and the need for further surgery. Medical risks include but are not limited to DVT and pulmonary embolism, myocardial infarction, stroke, pneumonia, respiratory failure and death. Patient understood these risks and wished to proceed.   OPERATIVE FINDINGS: Superior posterior dislocation of left hip hemiarthroplasty prosthesis  OPERATIVE PROCEDURE: Patient was met in the preoperative area.  The left hip was marked according the hospital's correct site of surgery protocol. Patient is brought to the operating room. She was placed on the operative table in the supine position. She received sedation propofol. Patient had a closed reduction performed with hip flexion and longitudinal traction with gentle internal and external rotation. C-arm images were used to confirm the patient hemiarthroplasty prosthesis was reduced. The hip was then ranged under fluoroscopy and was found to be extremely stable. Patient was placed in a hip abduction pillow and brought to the PACU in stable condition.

## 2016-11-04 ENCOUNTER — Encounter: Payer: Self-pay | Admitting: Orthopedic Surgery

## 2016-11-04 LAB — BASIC METABOLIC PANEL
Anion gap: 4 — ABNORMAL LOW (ref 5–15)
BUN: 18 mg/dL (ref 6–20)
CHLORIDE: 105 mmol/L (ref 101–111)
CO2: 28 mmol/L (ref 22–32)
CREATININE: 1.1 mg/dL — AB (ref 0.44–1.00)
Calcium: 9.7 mg/dL (ref 8.9–10.3)
GFR calc non Af Amer: 42 mL/min — ABNORMAL LOW (ref >60)
GFR, EST AFRICAN AMERICAN: 48 mL/min — AB (ref >60)
Glucose, Bld: 104 mg/dL — ABNORMAL HIGH (ref 65–99)
POTASSIUM: 4.1 mmol/L (ref 3.5–5.1)
SODIUM: 137 mmol/L (ref 135–145)

## 2016-11-04 LAB — CBC
HEMATOCRIT: 31.2 % — AB (ref 35.0–47.0)
HEMOGLOBIN: 10.5 g/dL — AB (ref 12.0–16.0)
MCH: 33 pg (ref 26.0–34.0)
MCHC: 33.7 g/dL (ref 32.0–36.0)
MCV: 97.9 fL (ref 80.0–100.0)
Platelets: 297 10*3/uL (ref 150–440)
RBC: 3.19 MIL/uL — AB (ref 3.80–5.20)
RDW: 14.1 % (ref 11.5–14.5)
WBC: 11.2 10*3/uL — ABNORMAL HIGH (ref 3.6–11.0)

## 2016-11-04 MED ORDER — ENOXAPARIN SODIUM 30 MG/0.3ML ~~LOC~~ SOLN: 30.0000 mg | SUBCUTANEOUS | 0 refills | Status: DC

## 2016-11-04 MED ORDER — OXYCODONE-ACETAMINOPHEN 5-325 MG PO TABS: 1.0000 | ORAL_TABLET | Freq: Four times a day (QID) | ORAL | 0 refills | Status: DC | PRN

## 2016-11-04 NOTE — Progress Notes (Signed)
PT is recommending SNF. Plan is for patient to D/C to [facility] room 219 tomorrow pending medical clearance. RN will call report at [phone]. Clinical Education officer, museum (CSW) sent D/C summary to [facility] today via [facility]. [facility] admissions coordinator at [facility] is aware of above. Patient is aware of above. CSW will continue to follow and assist as needed.   [employer], LCSW [phone]

## 2016-11-04 NOTE — Discharge Summary (Addendum)
[facility] [place] NAME: Jacqueline Sanchez    MR#:  [id number]  DATE OF BIRTH:  05-24-1919  DATE OF ADMISSION:  11/02/2016 ADMITTING PHYSICIAN: Vaughan Basta, MD  DATE OF DISCHARGE: 11/04/2016  PRIMARY CARE PHYSICIAN: Venia Carbon, MD    ADMISSION DIAGNOSIS:  Closed dislocation of left hip, initial encounter ([place]) [S73.005A]  DISCHARGE DIAGNOSIS:  Principal Problem:   Hip dislocation, left ([place])   SECONDARY DIAGNOSIS:   Past Medical History:  Diagnosis Date  . Aphasia 04/2016   Transient---??TIA  . Asthma   . CKD (chronic kidney disease)   . Edema   . Femur fracture, left ([place]) 09/06/2016  . GERD (gastroesophageal reflux disease)   . Hyperlipidemia   . Hypertension   . Osteoporosis     HOSPITAL COURSE:  83 year old female with history of intermittent asthma status post hemiarthroplasty in April 2018 who presents with left hip pain and found to have dislocation of hip prosthesis.  1. Left hip pain due to dislocation of left hip prosthesis: Postoperative day #2 CLOSED REDUCTION LEFT HIP HEMIARTHROPLASTY PROSTHESIS Appreciate orthopedic surgery consultation DVT prophylaxis as per orthopedic surgery. Physical therapy is recommended skilled nursing facility at discharge.  Patient will need stay at skilled nursing facility upon discharge. She needs to observe posterior hip precautions.  Patient may weight-bear as tolerated on the left lower extremity. Will follow up with Dr Barbaraann Cao in approximately 2 weeks.   2.  Essential hypertension:Continue Norvasc, hydralazine, losartan, metoprolol BP improved. .  3. Hyperlipidemia: Continue atorvastatin  4. Anemia of chronic disease: Continue ferrous sulfate tablets  5. Acute kidney injury: This has improved   DISCHARGE CONDITIONS AND DIET:  Stable regular diet  CONSULTS OBTAINED:    DRUG ALLERGIES:   Allergies  Allergen Reactions  . Morphine And Related  Nausea Only    DISCHARGE MEDICATIONS:   Current Discharge Medication List    CONTINUE these medications which have CHANGED   Details  enoxaparin (LOVENOX) 30 MG/0.3ML injection Inject 0.3 mLs (30 mg total) into the skin daily. Qty: 4.2 mL, Refills: 0    oxyCODONE-acetaminophen (PERCOCET/ROXICET) 5-325 MG tablet Take 1 tablet by mouth every 6 (six) hours as needed for severe pain. Qty: 30 tablet, Refills: 0      CONTINUE these medications which have NOT CHANGED   Details  amLODipine (NORVASC) 10 MG tablet Take 1 tablet (10 mg total) by mouth daily. Qty: 30 tablet, Refills: 0    aspirin EC 81 MG tablet Take 81 mg by mouth every other day.    atorvastatin (LIPITOR) 80 MG tablet Take 1 tablet by mouth daily.    ferrous sulfate 325 (65 FE) MG tablet Take 325 mg by mouth daily with breakfast.    hydrALAZINE (APRESOLINE) 25 MG tablet Take 1 tablet (25 mg total) by mouth 4 (four) times daily. Qty: 120 tablet, Refills: 1    losartan (COZAAR) 50 MG tablet Take 1 tablet by mouth daily.    Melatonin 3 MG TABS Take 1 tablet by mouth at bedtime.    metoprolol succinate (TOPROL-XL) 50 MG 24 hr tablet Take 1 tablet (50 mg total) by mouth daily. Take with or immediately following a meal. Qty: 30 tablet, Refills: 1    Multiple Vitamin (MULTI-VITAMINS) TABS Take 1 tablet by mouth daily.    polyethylene glycol (MIRALAX / GLYCOLAX) packet Take 17 g by mouth daily.    senna (SENOKOT) 8.6 MG tablet Take 2 tablets by mouth 2 (two) times  daily.     vitamin B-12 (CYANOCOBALAMIN) 1000 MCG tablet Take 1,000 mcg by mouth daily.    Vitamin D, Cholecalciferol, 1000 units CAPS Take 1 capsule by mouth daily.    acetaminophen (TYLENOL) 325 MG tablet Take 2 tablets (650 mg total) by mouth every 6 (six) hours as needed for mild pain.          Today   CHIEF COMPLAINT:  Doing well no issues   VITAL SIGNS:  Blood pressure (!) 135/50, pulse 80, temperature 98.1 F (36.7 C), temperature source  Oral, resp. rate 16, height 5\' 4"  (1.626 m), weight 59 kg (130 lb), SpO2 97 %.   REVIEW OF SYSTEMS:  Review of Systems  Constitutional: Negative.  Negative for chills, fever and malaise/fatigue.  HENT: Negative.  Negative for ear discharge, ear pain, hearing loss, nosebleeds and sore throat.   Eyes: Negative.  Negative for blurred vision and pain.  Respiratory: Negative.  Negative for cough, hemoptysis, shortness of breath and wheezing.   Cardiovascular: Negative.  Negative for chest pain, palpitations and leg swelling.  Gastrointestinal: Negative.  Negative for abdominal pain, blood in stool, diarrhea, nausea and vomiting.  Genitourinary: Negative.  Negative for dysuria.  Musculoskeletal: Negative.  Negative for back pain.  Skin: Negative.   Neurological: Negative for dizziness, tremors, speech change, focal weakness, seizures and headaches.  Endo/Heme/Allergies: Negative.  Does not bruise/bleed easily.  Psychiatric/Behavioral: Negative.  Negative for depression, hallucinations and suicidal ideas.     PHYSICAL EXAMINATION:  GENERAL:  83 y.o.-year-old patient lying in the bed with no acute distress.  NECK:  Supple, no jugular venous distention. No thyroid enlargement, no tenderness.  LUNGS: Normal breath sounds bilaterally, no wheezing, rales,rhonchi  No use of accessory muscles of respiration.  CARDIOVASCULAR: S1, S2 normal. No murmurs, rubs, or gallops.  ABDOMEN: Soft, non-tender, non-distended. Bowel sounds present. No organomegaly or mass.  EXTREMITIES: No pedal edema, cyanosis, or clubbing.  PSYCHIATRIC: The patient is alert and oriented x 3.  SKIN: No obvious rash, lesion, or ulcer.   DATA REVIEW:   CBC  Recent Labs Lab 11/05/16 0456  WBC 9.0  HGB 10.0*  HCT 30.3*  PLT 274    Chemistries   Recent Labs Lab 11/05/16 0456  NA 136  K 3.9  CL 103  CO2 28  GLUCOSE 94  BUN 27*  CREATININE 1.29*  CALCIUM 9.4    Cardiac Enzymes No results for input(s):  TROPONINI in the last 168 hours.  Microbiology Results  @MICRORSLT48 @  RADIOLOGY:  Dg Hip Port Unilat With Pelvis 1v Left  Result Date: 11/03/2016 CLINICAL DATA:  Postop left hip dislocation EXAM: DG HIP (WITH OR WITHOUT PELVIS) 1V PORT LEFT COMPARISON:  11/02/2016 FINDINGS: Reduction of left bipolar hip arthroplasty without evidence of postreduction hardware failure or fracture. IMPRESSION: Satisfactory reduction of left bipolar hip arthroplasty without post procedural hardware failure nor fracture. Alignment appears anatomic. Electronically Signed   By: Ashley Royalty M.D.   On: 11/03/2016 13:49   Dg Hip Operative Unilat W Or W/o Pelvis Left  Result Date: 11/03/2016 CLINICAL DATA:  Hip dislocation. EXAM: OPERATIVE LEFT HIP (WITH PELVIS IF PERFORMED) 2 VIEWS TECHNIQUE: Fluoroscopic spot image(s) were submitted for interpretation post-operatively. COMPARISON:  11/02/2016. FINDINGS: The LEFT hip is dislocated on preoperative films. Intraoperative films show satisfactory reduction. IMPRESSION: Satisfactory reduction of LEFT hip dislocation. Electronically Signed   By: Staci Righter M.D.   On: 11/03/2016 13:11      Current Discharge Medication List  CONTINUE these medications which have CHANGED   Details  enoxaparin (LOVENOX) 30 MG/0.3ML injection Inject 0.3 mLs (30 mg total) into the skin daily. Qty: 4.2 mL, Refills: 0    oxyCODONE-acetaminophen (PERCOCET/ROXICET) 5-325 MG tablet Take 1 tablet by mouth every 6 (six) hours as needed for severe pain. Qty: 30 tablet, Refills: 0      CONTINUE these medications which have NOT CHANGED   Details  amLODipine (NORVASC) 10 MG tablet Take 1 tablet (10 mg total) by mouth daily. Qty: 30 tablet, Refills: 0    aspirin EC 81 MG tablet Take 81 mg by mouth every other day.    atorvastatin (LIPITOR) 80 MG tablet Take 1 tablet by mouth daily.    ferrous sulfate 325 (65 FE) MG tablet Take 325 mg by mouth daily with breakfast.    hydrALAZINE  (APRESOLINE) 25 MG tablet Take 1 tablet (25 mg total) by mouth 4 (four) times daily. Qty: 120 tablet, Refills: 1    losartan (COZAAR) 50 MG tablet Take 1 tablet by mouth daily.    Melatonin 3 MG TABS Take 1 tablet by mouth at bedtime.    metoprolol succinate (TOPROL-XL) 50 MG 24 hr tablet Take 1 tablet (50 mg total) by mouth daily. Take with or immediately following a meal. Qty: 30 tablet, Refills: 1    Multiple Vitamin (MULTI-VITAMINS) TABS Take 1 tablet by mouth daily.    polyethylene glycol (MIRALAX / GLYCOLAX) packet Take 17 g by mouth daily.    senna (SENOKOT) 8.6 MG tablet Take 2 tablets by mouth 2 (two) times daily.     vitamin B-12 (CYANOCOBALAMIN) 1000 MCG tablet Take 1,000 mcg by mouth daily.    Vitamin D, Cholecalciferol, 1000 units CAPS Take 1 capsule by mouth daily.    acetaminophen (TYLENOL) 325 MG tablet Take 2 tablets (650 mg total) by mouth every 6 (six) hours as needed for mild pain.           Management plans discussed with the patient and she is in agreement. Stable for discharge   Patient should follow up with pcp ortho  CODE STATUS:     Code Status Orders        Start     Ordered   11/02/16 1952  Do not attempt resuscitation (DNR)  Continuous    Question Answer Comment  In the event of cardiac or respiratory ARREST Do not call a "code blue"   In the event of cardiac or respiratory ARREST Do not perform Intubation, CPR, defibrillation or ACLS   In the event of cardiac or respiratory ARREST Use medication by any route, position, wound care, and other measures to relive pain and suffering. May use oxygen, suction and manual treatment of airway obstruction as needed for comfort.   Comments pt confirms DNR      11/02/16 1951    Code Status History    Date Active Date Inactive Code Status Order ID Comments User Context   09/07/2016  7:36 AM 09/10/2016  6:06 PM DNR [id number]  Thornton Park, MD Inpatient   09/07/2016  3:17 AM 09/07/2016  7:36 AM DNR  [id number]  Melvern Sample, RN Inpatient   09/07/2016  3:00 AM 09/07/2016  3:17 AM Full Code [id number]  [place], [place], DO Inpatient   09/07/2016  2:59 AM 09/07/2016  3:00 AM DNR [id number]  Harvie Bridge, DO Inpatient   04/20/2016  6:54 PM 04/23/2016  4:13 PM Full Code [id number]  Hower, Aaron Mose, MD ED  Advance Directive Documentation     Most Recent Value  Type of Advance Directive  Healthcare Power of Attorney  Pre-existing out of facility DNR order (yellow form or pink MOST form)  -  "MOST" Form in Place?  -      TOTAL TIME TAKING CARE OF THIS PATIENT: 37 minutes.    Note: This dictation was prepared with Dragon dictation along with smaller phrase technology. Any transcriptional errors that result from this process are unintentional.  Anya Murphey M.D on 11/05/2016 at 8:54 AM  Between 7am to 6pm - Pager - [phone] After 6pm go to [email or web address] - password [email or web address] [email or web address]  Office  [phone]  CC: Primary care physician; Venia Carbon, MD

## 2016-11-04 NOTE — Evaluation (Signed)
Physical Therapy Evaluation Patient Details Name: Jacqueline Sanchez MRN: [id number] DOB: 1919-10-28 Today's Date: 11/04/2016   History of Present Illness  Pt is a 83 yo F admitted to acute care with L hip prosthesis sup/post dislocation on 11/02/16 (prior L bipolar hip arthroplasty performed in April of 2018). Pt underwent L hip closed reduction yesterday (11/03/16). Prior to admission pt independent with transfers and amb, requiring caregiver assist for ADL's (living at [facility] facility). Pt with PMH of the following: L hemiarthroplasty, asthma, HLD, HTN, OP, and CKD.  Clinical Impression  Pt is a pleasant 83 y.o. F, admitted to acute care following L hip prosthesis sup/post dislocation on 11/02/16, following by a L hip closed reduction yesterday (11/03/16). Pt performs bed mobility with minA, secondary to impaired generalized strength. Tranfers and ambulation performed with minA, due to impaired LE strength and safety awareness. Max ambulation distance of 45 ft, limited by pain and fatigue; Modified Borg RPE score rated at 5/10 following ambulation (pain at 5/10, resolving quickly at rest). Pt presents with the following deficits: LE strength, balance, CV endurance, and safety awareness. Overall, pt responded well to today's treatment with no adverse affects. Pt would benefit from skilled PT to address the previously mentioned impairments and promote return to PLOF. Recommended transfer to SNF upon d/c.     Follow Up Recommendations SNF    Equipment Recommendations  None recommended by PT    Recommendations for Other Services       Precautions / Restrictions Precautions Precautions: Posterior Hip;Fall Precaution Booklet Issued: No Precaution Comments: post L hip precautions: avoid hip flex > 90 deg, hip add, and hip IR. Required Braces or Orthoses: Other Brace/Splint (L hip abd brace) Restrictions Weight Bearing Restrictions: Yes LLE Weight Bearing: Weight bearing as tolerated       Mobility  Bed Mobility Overal bed mobility: Needs Assistance Bed Mobility: Supine to Sit     Supine to sit: Min assist     General bed mobility comments: MinA with supine to sit, secondary to generalized weakness and poor sequencing. Pt requires mod cues for maintaining hip precautions and sequencing.    Transfers Overall transfer level: Needs assistance   Transfers: Sit to/from Stand Sit to Stand: Min assist         General transfer comment: Pt minA with STS transfers, secondary to generalized muscle weakness and coordination (due to altered COG for maintaining L hip precautions).   Ambulation/Gait Ambulation/Gait assistance: Min assist Ambulation Distance (Feet): 45 Feet Assistive device: Rolling walker (2 wheeled) Gait Pattern/deviations: Step-through pattern;Decreased step length - right;Decreased step length - left     General Gait Details: Pt required MinA, secondary to impaired LE strength. Pt required mod verbal/tactile cues to maintain close proximity to RW, perform correct sequencing, maintain hip precutions, and maneuver RW with turns.   Stairs            Wheelchair Mobility    Modified Rankin (Stroke Patients Only)       Balance Overall balance assessment: Needs assistance Sitting-balance support: Bilateral upper extremity supported;Feet supported   Sitting balance - Comments: CGA required to maintain sitting balance, secondary to alterned COG tyo maintain L hip precautions.    Standing balance support: Bilateral upper extremity supported   Standing balance comment: Requires minA and UE support from RW to maintain balance, secondary to impaired LE strength and pain.  Pertinent Vitals/Pain Pain Assessment: 0-10 Pain Score: 5  (0/10 pain at rest. 5/10 with funcitonal mobility/transfers.) Pain Location: L hip  Pain Descriptors / Indicators: Discomfort Pain Intervention(s): Limited activity within  patient's tolerance;Monitored during session;Premedicated before session    Home Living Family/patient expects to be discharged to:: Assisted living               Home Equipment: Walker - 2 wheels;Cane - single point Additional Comments: Primarily uses RW, but is workingn with [facility] PT to amb with SPC.    Prior Function Level of Independence: Independent with assistive device(s)               Hand Dominance        Extremity/Trunk Assessment   Upper Extremity Assessment Upper Extremity Assessment: Overall WFL for tasks assessed    Lower Extremity Assessment Lower Extremity Assessment: Generalized weakness (MMT: grossly 4/5 to B LE's)       Communication   Communication: No difficulties  Cognition Arousal/Alertness: Awake/alert Behavior During Therapy: WFL for tasks assessed/performed Overall Cognitive Status: Within Functional Limits for tasks assessed                                        General Comments      Exercises Other Exercises Other Exercises: Supine therex performed to L LE'swith supervison x10 of the following: SLR, abd, ankle pumps (B LE's), glute sets, and quad sets.  Other Exercises: Toileting: Requires minA with transfering and managing clothing, secondary to impaired LE strength, balance, and pain.    Assessment/Plan    PT Assessment Patient needs continued PT services  PT Problem List Decreased strength;Decreased activity tolerance;Decreased mobility;Decreased balance;Decreased coordination;Decreased safety awareness;Decreased knowledge of precautions;Pain       PT Treatment Interventions Gait training;DME instruction;Functional mobility training;Therapeutic activities;Therapeutic exercise;Neuromuscular re-education;Patient/family education    PT Goals (Current goals can be found in the Care Plan section)  Acute Rehab PT Goals Patient Stated Goal: "I want to go home." PT Goal Formulation: With patient Time For Goal  Achievement: 11/18/16 Potential to Achieve Goals: Good    Frequency 7X/week   Barriers to discharge        Co-evaluation               AM-PAC PT "6 Clicks" Daily Activity  Outcome Measure Difficulty turning over in bed (including adjusting bedclothes, sheets and blankets)?: Total Difficulty moving from lying on back to sitting on the side of the bed? : Total Difficulty sitting down on and standing up from a chair with arms (e.g., wheelchair, bedside commode, etc,.)?: Total Help needed moving to and from a bed to chair (including a wheelchair)?: A Little Help needed walking in hospital room?: A Little Help needed climbing 3-5 steps with a railing? : A Lot 6 Click Score: 11    End of Session Equipment Utilized During Treatment: Gait belt Activity Tolerance: Patient tolerated treatment well Patient left: in chair;with call bell/phone within reach;with chair alarm set;with family/visitor present;with SCD's reapplied Nurse Communication: Mobility status PT Visit Diagnosis: Unsteadiness on feet (R26.81);Other abnormalities of gait and mobility (R26.89);Muscle weakness (generalized) (M62.81);History of falling (Z91.81);Pain Pain - Right/Left: Left    Time: 5102-5852 PT Time Calculation (min) (ACUTE ONLY): 40 min   Charges:         PT G Codes:        Oran Rein PT, SPT  Bevelyn Ngo  11/04/2016, 12:57 PM

## 2016-11-04 NOTE — Progress Notes (Signed)
[facility] [place] NAME: Jacqueline Sanchez    MR#:  [id number]  DATE OF BIRTH:  May 17, 1920  SUBJECTIVE:   Patient feeling well this morning. No hip pain.  REVIEW OF SYSTEMS:    Review of Systems  Constitutional: Negative for fever, chills weight loss HENT: Negative for ear pain, nosebleeds, congestion, facial swelling, rhinorrhea, neck pain, neck stiffness and ear discharge.   Respiratory: Negative for cough, shortness of breath, wheezing  Cardiovascular: Negative for chest pain, palpitations and leg swelling.  Gastrointestinal: Negative for heartburn, abdominal pain, vomiting, diarrhea or consitpation Genitourinary: Negative for dysuria, urgency, frequency, hematuria Musculoskeletal: No back or hip pain  Neurological: Negative for dizziness, seizures, syncope, focal weakness,  numbness and headaches.  Hematological: Does not bruise/bleed easily.  Psychiatric/Behavioral: Negative for hallucinations, confusion, dysphoric mood    Tolerating Diet: yes      DRUG ALLERGIES:   Allergies  Allergen Reactions  . Morphine And Related Nausea Only    VITALS:  Blood pressure (!) 185/61, pulse 94, temperature 98.7 F (37.1 C), temperature source Oral, resp. rate 19, height 5\' 4"  (1.626 m), weight 59 kg (130 lb), SpO2 94 %.  PHYSICAL EXAMINATION:  Constitutional: Appears well-developed and well-nourished. No distress. HENT: Normocephalic. Marland Kitchen Oropharynx is clear and moist.  Eyes: Conjunctivae and EOM are normal. PERRLA, no scleral icterus.  Neck: Normal ROM. Neck supple. No JVD. No tracheal deviation. CVS: RRR, S1/S2 +, 2/6 SEM, no gallops, no carotid bruit.  Pulmonary: Effort and breath sounds normal, no stridor, rhonchi, wheezes, rales.  Abdominal: Soft. BS +,  no distension, tenderness, rebound or guarding.  Musculoskeletal: Shortening of left lower extremity  Wearing hip abduction brace  Neuro: Alert. CN 2-12 grossly intact. No focal  deficits. Skin: Skin is warm and dry. No rash noted. Psychiatric: Normal mood and affect.      LABORATORY PANEL:   CBC  Recent Labs Lab 11/04/16 0418  WBC 11.2*  HGB 10.5*  HCT 31.2*  PLT 297   ------------------------------------------------------------------------------------------------------------------  Chemistries   Recent Labs Lab 11/04/16 0418  NA 137  K 4.1  CL 105  CO2 28  GLUCOSE 104*  BUN 18  CREATININE 1.10*  CALCIUM 9.7   ------------------------------------------------------------------------------------------------------------------  Cardiac Enzymes No results for input(s): TROPONINI in the last 168 hours. ------------------------------------------------------------------------------------------------------------------  RADIOLOGY:  Dg Hip Port Unilat With Pelvis 1v Left  Result Date: 11/03/2016 CLINICAL DATA:  Postop left hip dislocation EXAM: DG HIP (WITH OR WITHOUT PELVIS) 1V PORT LEFT COMPARISON:  11/02/2016 FINDINGS: Reduction of left bipolar hip arthroplasty without evidence of postreduction hardware failure or fracture. IMPRESSION: Satisfactory reduction of left bipolar hip arthroplasty without post procedural hardware failure nor fracture. Alignment appears anatomic. Electronically Signed   By: Ashley Royalty M.D.   On: 11/03/2016 13:49   Dg Hip Operative Unilat W Or W/o Pelvis Left  Result Date: 11/03/2016 CLINICAL DATA:  Hip dislocation. EXAM: OPERATIVE LEFT HIP (WITH PELVIS IF PERFORMED) 2 VIEWS TECHNIQUE: Fluoroscopic spot image(s) were submitted for interpretation post-operatively. COMPARISON:  11/02/2016. FINDINGS: The LEFT hip is dislocated on preoperative films. Intraoperative films show satisfactory reduction. IMPRESSION: Satisfactory reduction of LEFT hip dislocation. Electronically Signed   By: Staci Righter M.D.   On: 11/03/2016 13:11   Dg Hip Unilat With Pelvis 2-3 Views Left  Result Date: 11/02/2016 CLINICAL DATA:  Left leg pain.  EXAM: DG HIP (WITH OR WITHOUT PELVIS) 2-3V LEFT COMPARISON:  09/07/2016 FINDINGS: The left hip prosthesis is dislocated posteriorly and  superiorly. No definite fracture. The right hip is normally located. The pubic symphysis and SI joints are intact. No definite pelvic fractures. IMPRESSION: Posterosuperior dislocation of the left femoral prosthesis. Electronically Signed   By: Marijo Sanes M.D.   On: 11/02/2016 16:04     ASSESSMENT AND PLAN:   83 year old female with history of intermittent asthma status post hemiarthroplasty in April 2018 who presents with left hip pain and found to have dislocation of hip prosthesis.  1. Left hip pain due to dislocation of left hip prosthesis: Postoperative day #1  CLOSED REDUCTION LEFT HIP HEMIARTHROPLASTY PROSTHESIS Appreciate orthopedic surgery consultation DVT prophylaxis as per orthopedic surgery. PT evaluation today  2. Accelerated Essential hypertension this am (AM meds not given yet): Continue Norvasc, hydralazine, losartan, metoprolol Monitor BP and it did improve throughout the day.  3. Hyperlipidemia: Continue atorvastatin  4. Anemia of chronic disease: Continue ferrous sulfate tablets  5. Acute kidney injury: This is improved  Management plans discussed with the patient and she is in agreement.  CODE STATUS: dnr  TOTAL TIME TAKING CARE OF THIS PATIENT: 22 minutes.     POSSIBLE D/C today DEPENDING ON PT evaluation  Takina Busser M.D on 11/04/2016 at 7:17 AM  Between 7am to 6pm - Pager - [phone] After 6pm go to [email or web address] - password [email or web address] [facility]  Office  [phone]  CC: Primary care physician; Venia Carbon, MD  Note: This dictation was prepared with Dragon dictation along with smaller phrase technology. Any transcriptional errors that result from this process are unintentional.

## 2016-11-04 NOTE — Progress Notes (Signed)
Subjective:  Postop day #1 status post closed reduction of left hip hemiarthroplasty. Patient reports minimal left hip pain.  She is sitting up in a chair. Patient is wearing her hip abduction brace. Objective:   VITALS:   Vitals:   11/03/16 2346 11/04/16 0432 11/04/16 1015 11/04/16 1019  BP: 130/63 (!) 185/61 (!) 118/50 (!) 120/52  Pulse: 84 94 96   Resp: 19 19 20    Temp: 98.3 F (36.8 C) 98.7 F (37.1 C) 98.5 F (36.9 C)   TempSrc: Oral Oral Oral   SpO2: 92% 94% 97%   Weight:      Height:        PHYSICAL EXAM:  Left lower extremity: Patient's hip abductor brace is in place. There is no signs of irritation. Neurovascular intact Sensation intact distally Intact pulses distally Dorsiflexion/Plantar flexion intact Compartment soft  LABS  Results for orders placed or performed during the hospital encounter of 11/02/16 (from the past 24 hour(s))  CBC     Status: Abnormal   Collection Time: 11/04/16  4:18 AM  Result Value Ref Range   WBC 11.2 (H) 3.6 - 11.0 K/uL   RBC 3.19 (L) 3.80 - 5.20 MIL/uL   Hemoglobin 10.5 (L) 12.0 - 16.0 g/dL   HCT 31.2 (L) 35.0 - 47.0 %   MCV 97.9 80.0 - 100.0 fL   MCH 33.0 26.0 - 34.0 pg   MCHC 33.7 32.0 - 36.0 g/dL   RDW 14.1 11.5 - 14.5 %   Platelets 297 150 - 440 K/uL  Basic metabolic panel     Status: Abnormal   Collection Time: 11/04/16  4:18 AM  Result Value Ref Range   Sodium 137 135 - 145 mmol/L   Potassium 4.1 3.5 - 5.1 mmol/L   Chloride 105 101 - 111 mmol/L   CO2 28 22 - 32 mmol/L   Glucose, Bld 104 (H) 65 - 99 mg/dL   BUN 18 6 - 20 mg/dL   Creatinine, Ser 1.10 (H) 0.44 - 1.00 mg/dL   Calcium 9.7 8.9 - 10.3 mg/dL   GFR calc non Af Amer 42 (L) >60 mL/min   GFR calc Af Amer 48 (L) >60 mL/min   Anion gap 4 (L) 5 - 15    Dg Hip Port Unilat With Pelvis 1v Left  Result Date: 11/03/2016 CLINICAL DATA:  Postop left hip dislocation EXAM: DG HIP (WITH OR WITHOUT PELVIS) 1V PORT LEFT COMPARISON:  11/02/2016 FINDINGS: Reduction of  left bipolar hip arthroplasty without evidence of postreduction hardware failure or fracture. IMPRESSION: Satisfactory reduction of left bipolar hip arthroplasty without post procedural hardware failure nor fracture. Alignment appears anatomic. Electronically Signed   By: Ashley Royalty M.D.   On: 11/03/2016 13:49   Dg Hip Operative Unilat W Or W/o Pelvis Left  Result Date: 11/03/2016 CLINICAL DATA:  Hip dislocation. EXAM: OPERATIVE LEFT HIP (WITH PELVIS IF PERFORMED) 2 VIEWS TECHNIQUE: Fluoroscopic spot image(s) were submitted for interpretation post-operatively. COMPARISON:  11/02/2016. FINDINGS: The LEFT hip is dislocated on preoperative films. Intraoperative films show satisfactory reduction. IMPRESSION: Satisfactory reduction of LEFT hip dislocation. Electronically Signed   By: Staci Righter M.D.   On: 11/03/2016 13:11   Dg Hip Unilat With Pelvis 2-3 Views Left  Result Date: 11/02/2016 CLINICAL DATA:  Left leg pain. EXAM: DG HIP (WITH OR WITHOUT PELVIS) 2-3V LEFT COMPARISON:  09/07/2016 FINDINGS: The left hip prosthesis is dislocated posteriorly and superiorly. No definite fracture. The right hip is normally located. The pubic symphysis and SI  joints are intact. No definite pelvic fractures. IMPRESSION: Posterosuperior dislocation of the left femoral prosthesis. Electronically Signed   By: Marijo Sanes M.D.   On: 11/02/2016 16:04    Assessment/Plan: 1 Day Post-Op   Principal Problem:   Hip dislocation, left (HCC)  Continue hip abduction brace. She should wear the brace at all times.  Patient to have physical therapy evaluation. Patient will need stay at skilled nursing facility upon discharge. She needs to observe posterior hip precautions.  Patient may weight-bear as tolerated on the left lower extremity. Will follow up with me in approximately 2 weeks.    Thornton Park , MD 11/04/2016, 12:37 PM

## 2016-11-05 DIAGNOSIS — T84021D Dislocation of internal left hip prosthesis, subsequent encounter: Secondary | ICD-10-CM | POA: Diagnosis not present

## 2016-11-05 DIAGNOSIS — T84021A Dislocation of internal left hip prosthesis, initial encounter: Secondary | ICD-10-CM | POA: Diagnosis not present

## 2016-11-05 DIAGNOSIS — Z7982 Long term (current) use of aspirin: Secondary | ICD-10-CM | POA: Diagnosis not present

## 2016-11-05 DIAGNOSIS — Z8673 Personal history of transient ischemic attack (TIA), and cerebral infarction without residual deficits: Secondary | ICD-10-CM | POA: Diagnosis not present

## 2016-11-05 DIAGNOSIS — M25552 Pain in left hip: Secondary | ICD-10-CM | POA: Diagnosis not present

## 2016-11-05 DIAGNOSIS — E785 Hyperlipidemia, unspecified: Secondary | ICD-10-CM | POA: Diagnosis not present

## 2016-11-05 DIAGNOSIS — I10 Essential (primary) hypertension: Secondary | ICD-10-CM | POA: Diagnosis not present

## 2016-11-05 DIAGNOSIS — Z7401 Bed confinement status: Secondary | ICD-10-CM | POA: Diagnosis not present

## 2016-11-05 DIAGNOSIS — W19XXXA Unspecified fall, initial encounter: Secondary | ICD-10-CM | POA: Diagnosis not present

## 2016-11-05 DIAGNOSIS — S73005A Unspecified dislocation of left hip, initial encounter: Secondary | ICD-10-CM | POA: Diagnosis not present

## 2016-11-05 DIAGNOSIS — K219 Gastro-esophageal reflux disease without esophagitis: Secondary | ICD-10-CM | POA: Diagnosis not present

## 2016-11-05 DIAGNOSIS — Z79899 Other long term (current) drug therapy: Secondary | ICD-10-CM | POA: Diagnosis not present

## 2016-11-05 DIAGNOSIS — M810 Age-related osteoporosis without current pathological fracture: Secondary | ICD-10-CM | POA: Diagnosis not present

## 2016-11-05 DIAGNOSIS — M24452 Recurrent dislocation, left hip: Secondary | ICD-10-CM | POA: Diagnosis not present

## 2016-11-05 DIAGNOSIS — Z471 Aftercare following joint replacement surgery: Secondary | ICD-10-CM | POA: Diagnosis not present

## 2016-11-05 DIAGNOSIS — S73036D Other anterior dislocation of unspecified hip, subsequent encounter: Secondary | ICD-10-CM | POA: Diagnosis not present

## 2016-11-05 DIAGNOSIS — R2689 Other abnormalities of gait and mobility: Secondary | ICD-10-CM | POA: Diagnosis not present

## 2016-11-05 DIAGNOSIS — T8489XA Other specified complication of internal orthopedic prosthetic devices, implants and grafts, initial encounter: Secondary | ICD-10-CM | POA: Diagnosis not present

## 2016-11-05 DIAGNOSIS — I129 Hypertensive chronic kidney disease with stage 1 through stage 4 chronic kidney disease, or unspecified chronic kidney disease: Secondary | ICD-10-CM | POA: Diagnosis not present

## 2016-11-05 DIAGNOSIS — G4700 Insomnia, unspecified: Secondary | ICD-10-CM | POA: Diagnosis not present

## 2016-11-05 DIAGNOSIS — Z885 Allergy status to narcotic agent status: Secondary | ICD-10-CM | POA: Diagnosis not present

## 2016-11-05 DIAGNOSIS — Z7902 Long term (current) use of antithrombotics/antiplatelets: Secondary | ICD-10-CM | POA: Diagnosis not present

## 2016-11-05 DIAGNOSIS — J45909 Unspecified asthma, uncomplicated: Secondary | ICD-10-CM | POA: Diagnosis not present

## 2016-11-05 DIAGNOSIS — N183 Chronic kidney disease, stage 3 (moderate): Secondary | ICD-10-CM | POA: Diagnosis not present

## 2016-11-05 DIAGNOSIS — R4701 Aphasia: Secondary | ICD-10-CM | POA: Diagnosis not present

## 2016-11-05 DIAGNOSIS — Z66 Do not resuscitate: Secondary | ICD-10-CM | POA: Diagnosis not present

## 2016-11-05 DIAGNOSIS — M6281 Muscle weakness (generalized): Secondary | ICD-10-CM | POA: Diagnosis not present

## 2016-11-05 DIAGNOSIS — M159 Polyosteoarthritis, unspecified: Secondary | ICD-10-CM | POA: Diagnosis not present

## 2016-11-05 DIAGNOSIS — Z96642 Presence of left artificial hip joint: Secondary | ICD-10-CM | POA: Diagnosis not present

## 2016-11-05 LAB — CBC
HEMATOCRIT: 30.3 % — AB (ref 35.0–47.0)
Hemoglobin: 10 g/dL — ABNORMAL LOW (ref 12.0–16.0)
MCH: 32.6 pg (ref 26.0–34.0)
MCHC: 33.1 g/dL (ref 32.0–36.0)
MCV: 98.5 fL (ref 80.0–100.0)
Platelets: 274 10*3/uL (ref 150–440)
RBC: 3.08 MIL/uL — AB (ref 3.80–5.20)
RDW: 14 % (ref 11.5–14.5)
WBC: 9 10*3/uL (ref 3.6–11.0)

## 2016-11-05 LAB — BASIC METABOLIC PANEL
ANION GAP: 5 (ref 5–15)
BUN: 27 mg/dL — ABNORMAL HIGH (ref 6–20)
CHLORIDE: 103 mmol/L (ref 101–111)
CO2: 28 mmol/L (ref 22–32)
Calcium: 9.4 mg/dL (ref 8.9–10.3)
Creatinine, Ser: 1.29 mg/dL — ABNORMAL HIGH (ref 0.44–1.00)
GFR calc Af Amer: 40 mL/min — ABNORMAL LOW (ref >60)
GFR calc non Af Amer: 34 mL/min — ABNORMAL LOW (ref >60)
GLUCOSE: 94 mg/dL (ref 65–99)
POTASSIUM: 3.9 mmol/L (ref 3.5–5.1)
Sodium: 136 mmol/L (ref 135–145)

## 2016-11-05 NOTE — Progress Notes (Signed)
Pt discharged to [facility].D/C instructions and meds reviewed with Pt.  Report given to [place], Therapist, sports. Transported by EMS.

## 2016-11-05 NOTE — Clinical Social Work Note (Signed)
The patient will discharge today to [facility] via non-emergent EMS. The patient and facility are aware and in agreement. The patient reported that she has alerted her family that she is discharging. The packet has been delivered to the chart and all paperwork sent to the facility. The packet includes hard scripts as well as the patient's DNR. CSW will continue to follow pending additional discharge needs.  Santiago Bumpers, MSW, Latanya Presser [phone]

## 2016-11-05 NOTE — Progress Notes (Signed)
  Subjective:  POD #2 s/p closed reduction of left hip hemiarthroplasty.  Patient denies pain. Objective:   VITALS:   Vitals:   11/04/16 1500 11/04/16 2220 11/05/16 0753 11/05/16 1427  BP: (!) 108/46 (!) 156/51 (!) 135/50 (!) 139/46  Pulse:  82 80 82  Resp:  18 16   Temp:  99.5 F (37.5 C) 98.1 F (36.7 C)   TempSrc:  Oral Oral   SpO2:  94% 97%   Weight:      Height:        PHYSICAL EXAM:  Left hip abduction brace in place. Neurovascular intact Sensation intact distally Intact pulses distally Dorsiflexion/Plantar flexion intact  LABS  Results for orders placed or performed during the hospital encounter of 11/02/16 (from the past 24 hour(s))  CBC     Status: Abnormal   Collection Time: 11/05/16  4:56 AM  Result Value Ref Range   WBC 9.0 3.6 - 11.0 K/uL   RBC 3.08 (L) 3.80 - 5.20 MIL/uL   Hemoglobin 10.0 (L) 12.0 - 16.0 g/dL   HCT 30.3 (L) 35.0 - 47.0 %   MCV 98.5 80.0 - 100.0 fL   MCH 32.6 26.0 - 34.0 pg   MCHC 33.1 32.0 - 36.0 g/dL   RDW 14.0 11.5 - 14.5 %   Platelets 274 150 - 440 K/uL  Basic metabolic panel     Status: Abnormal   Collection Time: 11/05/16  4:56 AM  Result Value Ref Range   Sodium 136 135 - 145 mmol/L   Potassium 3.9 3.5 - 5.1 mmol/L   Chloride 103 101 - 111 mmol/L   CO2 28 22 - 32 mmol/L   Glucose, Bld 94 65 - 99 mg/dL   BUN 27 (H) 6 - 20 mg/dL   Creatinine, Ser 1.29 (H) 0.44 - 1.00 mg/dL   Calcium 9.4 8.9 - 10.3 mg/dL   GFR calc non Af Amer 34 (L) >60 mL/min   GFR calc Af Amer 40 (L) >60 mL/min   Anion gap 5 5 - 15    No results found.  Assessment/Plan: 2 Days Post-Op   Principal Problem:   Hip dislocation, left [facility])   Patient being discharged to rehab.  Follow up in 10-14 days.   Thornton Park , MD 11/05/2016, 3:08 PM

## 2016-11-05 NOTE — Progress Notes (Signed)
Physical Therapy Treatment Patient Details Name: Jacqueline Sanchez MRN: [id number] DOB: 04-12-1920 Today's Date: 11/05/2016    History of Present Illness Pt is a 83 yo F admitted to acute care with L hip prosthesis sup/post dislocation on 11/02/16 (prior L bipolar hip arthroplasty performed in April of 2018). Pt underwent L hip closed reduction yesterday (11/03/16). Prior to admission pt independent with transfers and amb, requiring caregiver assist for ADL's (living at [facility] facility). Pt with PMH of the following: L hemiarthroplasty, asthma, HLD, HTN, OP, and CKD.    PT Comments     Pt is a pleasant 83 y.o. F, admitted to acute care for L hip closed reduction on 11/03/16. Pt bed mobility, tranfers, and ambulation not performed today, though performed at minA level yesterday, secondary to impaired strength and endurance.  Pt unable to recall L hip precautions today; SPT provided education to pt on precautions, positioning and importance of performing HEP to aid in return to PLOF. Pt presented with more pain this am, rated at 5/10 to L hip. Prior to leaving, pt inconsistently reported L calf pain: no redness/swelling noted and no increase in pain upon palpation. SPT notified nursing of pt L calf pain. Pt cont to present with the following deficits: strength and endurance. Overall, pt responded well to today's treatment with no adverse affects. Pt would benefit from cont skilled PT to address the previously mentioned impairments and promote return to PLOF. Recommending SNF pending d/c.   Follow Up Recommendations  SNF     Equipment Recommendations  None recommended by PT    Recommendations for Other Services       Precautions / Restrictions Precautions Precautions: Posterior Hip;Fall Precaution Booklet Issued: Yes (comment) Precaution Comments: post L hip precautions: avoid hip flex > 90 deg, hip add, and hip IR. Required Braces or Orthoses: Other Brace/Splint (L hip abd  brace) Restrictions Weight Bearing Restrictions: Yes LLE Weight Bearing: Weight bearing as tolerated    Mobility  Bed Mobility                  Transfers                    Ambulation/Gait                 Stairs            Wheelchair Mobility    Modified Rankin (Stroke Patients Only)       Balance                                            Cognition Arousal/Alertness: Awake/alert Behavior During Therapy: WFL for tasks assessed/performed Overall Cognitive Status: Within Functional Limits for tasks assessed                                        Exercises Other Exercises Other Exercises: Supine therex performed to L LE'swith supervison x15 of the following: hip abd (required minA, secondary to pain), ankle pumps (B LE's), glute sets, quad sets, SAQ's. Pt education re: L hip precautions and HEP.    General Comments        Pertinent Vitals/Pain Pain Assessment: 0-10 Pain Score: 5  (Reduced to 4/10 following therex) Pain Location: L hip  Pain Descriptors /  Indicators: Discomfort;Constant Pain Intervention(s): Limited activity within patient's tolerance;Monitored during session;Repositioned    Home Living                      Prior Function            PT Goals (current goals can now be found in the care plan section) Acute Rehab PT Goals Patient Stated Goal: "I want to go home." PT Goal Formulation: With patient Time For Goal Achievement: 11/18/16 Potential to Achieve Goals: Good Progress towards PT goals: Progressing toward goals    Frequency    7X/week      PT Plan Current plan remains appropriate    Co-evaluation              AM-PAC PT "6 Clicks" Daily Activity  Outcome Measure  Difficulty turning over in bed (including adjusting bedclothes, sheets and blankets)?: Total Difficulty moving from lying on back to sitting on the side of the bed? : Total Difficulty  sitting down on and standing up from a chair with arms (e.g., wheelchair, bedside commode, etc,.)?: Total Help needed moving to and from a bed to chair (including a wheelchair)?: A Little Help needed walking in hospital room?: A Little Help needed climbing 3-5 steps with a railing? : A Lot 6 Click Score: 11    End of Session   Activity Tolerance: Patient tolerated treatment well Patient left: in bed;with call bell/phone within reach;with bed alarm set Nurse Communication: Mobility status;Other (comment) (Pt reports of L calf pain) PT Visit Diagnosis: Unsteadiness on feet (R26.81);Other abnormalities of gait and mobility (R26.89);Muscle weakness (generalized) (M62.81);History of falling (Z91.81);Pain Pain - Right/Left: Left Pain - part of body: Hip     Time: 8185-6314 PT Time Calculation (min) (ACUTE ONLY): 15 min  Charges:                       G Codes:       Oran Rein PT, SPT   Bevelyn Ngo 11/05/2016, 10:27 AM

## 2016-11-07 DIAGNOSIS — G4700 Insomnia, unspecified: Secondary | ICD-10-CM

## 2016-11-07 DIAGNOSIS — I10 Essential (primary) hypertension: Secondary | ICD-10-CM | POA: Diagnosis not present

## 2016-11-07 DIAGNOSIS — T84021A Dislocation of internal left hip prosthesis, initial encounter: Secondary | ICD-10-CM | POA: Diagnosis not present

## 2016-11-07 DIAGNOSIS — N183 Chronic kidney disease, stage 3 (moderate): Secondary | ICD-10-CM | POA: Diagnosis not present

## 2016-11-07 DIAGNOSIS — M159 Polyosteoarthritis, unspecified: Secondary | ICD-10-CM | POA: Diagnosis not present

## 2016-11-23 ENCOUNTER — Observation Stay
Admission: EM | Admit: 2016-11-23 | Discharge: 2016-11-25 | Disposition: A | Discharge status: Skilled Nursing Facility | Payer: Medicare Other | Attending: Orthopedic Surgery | Admitting: Orthopedic Surgery

## 2016-11-23 ENCOUNTER — Emergency Department: Payer: Medicare Other

## 2016-11-23 DIAGNOSIS — J45909 Unspecified asthma, uncomplicated: Secondary | ICD-10-CM | POA: Insufficient documentation

## 2016-11-23 DIAGNOSIS — K219 Gastro-esophageal reflux disease without esophagitis: Secondary | ICD-10-CM | POA: Insufficient documentation

## 2016-11-23 DIAGNOSIS — R4701 Aphasia: Secondary | ICD-10-CM | POA: Insufficient documentation

## 2016-11-23 DIAGNOSIS — Z79899 Other long term (current) drug therapy: Secondary | ICD-10-CM | POA: Insufficient documentation

## 2016-11-23 DIAGNOSIS — Z7902 Long term (current) use of antithrombotics/antiplatelets: Secondary | ICD-10-CM | POA: Insufficient documentation

## 2016-11-23 DIAGNOSIS — Z7982 Long term (current) use of aspirin: Secondary | ICD-10-CM | POA: Insufficient documentation

## 2016-11-23 DIAGNOSIS — Z66 Do not resuscitate: Secondary | ICD-10-CM | POA: Insufficient documentation

## 2016-11-23 DIAGNOSIS — Z885 Allergy status to narcotic agent status: Secondary | ICD-10-CM | POA: Insufficient documentation

## 2016-11-23 DIAGNOSIS — S73005A Unspecified dislocation of left hip, initial encounter: Secondary | ICD-10-CM | POA: Diagnosis not present

## 2016-11-23 DIAGNOSIS — Z8781 Personal history of (healed) traumatic fracture: Secondary | ICD-10-CM

## 2016-11-23 DIAGNOSIS — I129 Hypertensive chronic kidney disease with stage 1 through stage 4 chronic kidney disease, or unspecified chronic kidney disease: Secondary | ICD-10-CM | POA: Insufficient documentation

## 2016-11-23 DIAGNOSIS — M810 Age-related osteoporosis without current pathological fracture: Secondary | ICD-10-CM | POA: Insufficient documentation

## 2016-11-23 DIAGNOSIS — Z419 Encounter for procedure for purposes other than remedying health state, unspecified: Secondary | ICD-10-CM

## 2016-11-23 DIAGNOSIS — N183 Chronic kidney disease, stage 3 (moderate): Secondary | ICD-10-CM | POA: Diagnosis not present

## 2016-11-23 DIAGNOSIS — Z9889 Other specified postprocedural states: Secondary | ICD-10-CM

## 2016-11-23 DIAGNOSIS — Z96642 Presence of left artificial hip joint: Secondary | ICD-10-CM | POA: Insufficient documentation

## 2016-11-23 DIAGNOSIS — M24452 Recurrent dislocation, left hip: Principal | ICD-10-CM | POA: Diagnosis present

## 2016-11-23 DIAGNOSIS — E785 Hyperlipidemia, unspecified: Secondary | ICD-10-CM | POA: Diagnosis not present

## 2016-11-23 DIAGNOSIS — Z8673 Personal history of transient ischemic attack (TIA), and cerebral infarction without residual deficits: Secondary | ICD-10-CM | POA: Insufficient documentation

## 2016-11-23 DIAGNOSIS — M25552 Pain in left hip: Secondary | ICD-10-CM | POA: Insufficient documentation

## 2016-11-23 DIAGNOSIS — T8489XA Other specified complication of internal orthopedic prosthetic devices, implants and grafts, initial encounter: Secondary | ICD-10-CM | POA: Diagnosis not present

## 2016-11-23 LAB — CBC
HEMATOCRIT: 37.1 % (ref 35.0–47.0)
Hemoglobin: 13.1 g/dL (ref 12.0–16.0)
MCH: 34.7 pg — AB (ref 26.0–34.0)
MCHC: 35.2 g/dL (ref 32.0–36.0)
MCV: 98.4 fL (ref 80.0–100.0)
Platelets: 373 10*3/uL (ref 150–440)
RBC: 3.77 MIL/uL — ABNORMAL LOW (ref 3.80–5.20)
RDW: 14.5 % (ref 11.5–14.5)
WBC: 9.6 10*3/uL (ref 3.6–11.0)

## 2016-11-23 LAB — APTT: aPTT: 28 seconds (ref 24–36)

## 2016-11-23 LAB — BASIC METABOLIC PANEL
Anion gap: 8 (ref 5–15)
BUN: 24 mg/dL — AB (ref 6–20)
CALCIUM: 10.5 mg/dL — AB (ref 8.9–10.3)
CO2: 25 mmol/L (ref 22–32)
CREATININE: 1.14 mg/dL — AB (ref 0.44–1.00)
Chloride: 103 mmol/L (ref 101–111)
GFR calc non Af Amer: 40 mL/min — ABNORMAL LOW (ref >60)
GFR, EST AFRICAN AMERICAN: 46 mL/min — AB (ref >60)
Glucose, Bld: 112 mg/dL — ABNORMAL HIGH (ref 65–99)
Potassium: 4.5 mmol/L (ref 3.5–5.1)
SODIUM: 136 mmol/L (ref 135–145)

## 2016-11-23 LAB — PROTIME-INR
INR: 0.85
Prothrombin Time: 11.6 seconds (ref 11.4–15.2)

## 2016-11-23 MED ORDER — METHOCARBAMOL 1000 MG/10ML IJ SOLN
500.0000 mg | Freq: Four times a day (QID) | INTRAVENOUS | Status: DC | PRN
  Filled 2016-11-23: qty 5

## 2016-11-23 MED ORDER — POLYETHYLENE GLYCOL 3350 17 G PO PACK
17.0000 g | PACK | Freq: Every day | ORAL | Status: DC | PRN
  Filled 2016-11-23: qty 1

## 2016-11-23 MED ORDER — MORPHINE SULFATE (PF) 2 MG/ML IV SOLN: 2.0000 mg | INTRAVENOUS | Status: DC | PRN

## 2016-11-23 MED ORDER — SODIUM CHLORIDE 0.9 % IV SOLN: INTRAVENOUS | Status: DC

## 2016-11-23 MED ORDER — METHOCARBAMOL 500 MG PO TABS: 500.0000 mg | ORAL_TABLET | Freq: Four times a day (QID) | ORAL | Status: DC | PRN

## 2016-11-23 MED ORDER — BISACODYL 10 MG RE SUPP: 10.0000 mg | Freq: Every day | RECTAL | Status: DC | PRN

## 2016-11-23 MED ORDER — HYDROCODONE-ACETAMINOPHEN 5-325 MG PO TABS
1.0000 | ORAL_TABLET | Freq: Four times a day (QID) | ORAL | Status: DC | PRN
  Administered 2016-11-23 – 2016-11-24 (×3): 2 via ORAL
  Filled 2016-11-23 (×3): qty 2

## 2016-11-23 MED ORDER — MAGNESIUM CITRATE PO SOLN
1.0000 | Freq: Once | ORAL | Status: DC | PRN
  Filled 2016-11-23: qty 296

## 2016-11-23 MED ORDER — DOCUSATE SODIUM 100 MG PO CAPS
100.0000 mg | ORAL_CAPSULE | Freq: Two times a day (BID) | ORAL | Status: DC
  Administered 2016-11-23: 100 mg via ORAL
  Filled 2016-11-23: qty 1

## 2016-11-23 MED ORDER — FENTANYL CITRATE (PF) 100 MCG/2ML IJ SOLN
25.0000 ug | Freq: Once | INTRAMUSCULAR | Status: AC
  Administered 2016-11-23: 25 ug via INTRAVENOUS
  Filled 2016-11-23: qty 2

## 2016-11-23 NOTE — ED Triage Notes (Signed)
Pt arrived via ems for c/o left hip pain - Pt is at [facility] post left hip replacement and was set to be discharged home tomorrow - she went to sit down on the toilet and the lid was up and she slipped into the toilet causing her to injure her left hip again - the left leg is noted to be shortened and rotated inward

## 2016-11-23 NOTE — H&P (Signed)
PREOPERATIVE H&P  Chief Complaint: Recurrent dislocation of left hemiarthroplasty prosthesis  HPI: Jacqueline Sanchez is a 83 y.o. female who is admitted today after dislocating her left hemi-arthroplasty prosthesis. Patient explains that she went to sit on the toilet today and found to seat was down. Both the lid and seat were up when she went to sit down.  Patient had pain and could not bear weight on the left lower extremity when she went to stand up is brought to the [facility] emergency Department with pain and deformity of the left hip. X-rays confirmed she had a dislocation to the hemiarthroplasty prosthesis. Patient is seen in her hospital room this evening with her son at the bedside. She states that her left hip pain has improved after receiving Vicodin.  Past Medical History:  Diagnosis Date  . Aphasia 04/2016   Transient---??TIA  . Asthma   . CKD (chronic kidney disease)   . Edema   . Femur fracture, left ([place]) 09/06/2016  . GERD (gastroesophageal reflux disease)   . Hyperlipidemia   . Hypertension   . Osteoporosis    Past Surgical History:  Procedure Laterality Date  . HIP ARTHROPLASTY Left 09/07/2016   Procedure: ARTHROPLASTY BIPOLAR HIP (HEMIARTHROPLASTY);  Surgeon: Thornton Park, MD;  Location: [facility] ORS;  Service: Orthopedics;  Laterality: Left;  . HIP CLOSED REDUCTION Left 11/03/2016   Procedure: CLOSED REDUCTION HIP;  Surgeon: Thornton Park, MD;  Location: [facility] ORS;  Service: Orthopedics;  Laterality: Left;  . KYPHOPLASTY     Social History   Social History  . Marital status: Widowed    Spouse name: N/A  . Number of children: 2  . Years of education: N/A   Occupational History  . 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 Smoking status: Never Smoker  . Smokeless tobacco: Never Used  . Alcohol use No  . Drug use: Unknown  . Sexual activity: No   Other Topics Concern  . None   Social History Narrative   Widowed 2000   Has  living will   Sons/DILs are health care POAs (mostly Mike/Jan)   Has DNR   No tube feeds if cognitively unaware   Family History  Problem Relation Age of Onset  . Hypertension Other    Allergies  Allergen Reactions  . Morphine And Related Nausea Only   Prior to Admission medications   Medication Sig Start Date End Date Taking? Authorizing Provider  acetaminophen (TYLENOL) 325 MG tablet Take 2 tablets (650 mg total) by mouth every 6 (six) hours as needed for mild pain. 09/10/16  Yes Henreitta Leber, MD  amLODipine (NORVASC) 10 MG tablet Take 1 tablet (10 mg total) by mouth daily. 04/23/16  Yes Fritzi Mandes, MD  aspirin EC 81 MG tablet Take 81 mg by mouth every other day.   Yes [provider]  atorvastatin (LIPITOR) 80 MG tablet Take 1 tablet by mouth daily.   Yes [provider]  ferrous sulfate 325 (65 FE) MG tablet Take 325 mg by mouth daily with breakfast.   Yes [provider]  hydrALAZINE (APRESOLINE) 25 MG tablet Take 1 tablet (25 mg total) by mouth 4 (four) times daily. 04/23/16  Yes Fritzi Mandes, MD  losartan (COZAAR) 50 MG tablet Take 1 tablet by mouth daily. 03/08/16  Yes [provider]  Melatonin 3 MG TABS Take 1 tablet by mouth at bedtime.   Yes [provider]  metoprolol succinate (TOPROL-XL) 50 MG  24 hr tablet Take 1 tablet (50 mg total) by mouth daily. Take with or immediately following a meal. 04/23/16  Yes Fritzi Mandes, MD  Multiple Vitamin (MULTI-VITAMINS) TABS Take 1 tablet by mouth daily.   Yes [provider]  oxyCODONE-acetaminophen (PERCOCET/ROXICET) 5-325 MG tablet Take 1 tablet by mouth every 6 (six) hours as needed for severe pain. 11/04/16  Yes Mody, Ulice Bold, MD  polyethylene glycol (MIRALAX / GLYCOLAX) packet Take 17 g by mouth daily.   Yes [provider]  senna (SENOKOT) 8.6 MG tablet Take 2 tablets by mouth 2 (two) times daily.    Yes [provider]  vitamin B-12 (CYANOCOBALAMIN) 1000 MCG  tablet Take 1,000 mcg by mouth daily.   Yes [provider]  Vitamin D, Cholecalciferol, 1000 units CAPS Take 1 capsule by mouth daily.   Yes [provider]  enoxaparin (LOVENOX) 30 MG/0.3ML injection Inject 0.3 mLs (30 mg total) into the skin daily. 11/04/16 11/18/16  Bettey Costa, MD     Positive ROS: All other systems have been reviewed and were otherwise negative with the exception of those mentioned in the HPI and as above.  Physical Exam: General: Alert, no acute distress Cardiovascular: Regular rate and rhythm, no murmurs rubs or gallops.  No pedal edema Respiratory: Clear to auscultation bilaterally, no wheezes rales or rhonchi. No cyanosis, no use of accessory musculature GI: No organomegaly, abdomen is soft and non-tender nondistended with positive bowel sounds. Skin: Skin intact, no lesions within the operative field. Neurologic: Sensation intact distally Psychiatric: Patient is competent for consent with normal mood and affect Lymphatic: No cervical lymphadenopathy  MUSCULOSKELETAL: Left upper extremity: Patient has shortening and internal rotation of the left lower extremity. She has palpable pedal pulses, intact sensation light touch intact motor function distally.  Assessment:  Recurrent dislocation of left hemiarthroplasty prosthesis   Plan: I explained to the patient that tomorrow I would recommend a closed reduction of her left hip. He has the option of another trial of bracing in her hip abduction brace. However the patient states she does not want to live like this where she needs to be concerned about her left hip on a daily basis. Therefore I discussed surgical options as well which would include conversion to a constrained total hip.  The son expressed an interest to get a second opinion. The patient has previously been treated by Dr. Rudene Christians for a kyphoplasty. I offered to speak with Dr. Rudene Christians in the morning as I anticipate seeing him in the OR. I will  ask him to stop by and give the patient opinion.  I explained to the patient that I would be unable to perform a revision hip surgery this week as I will be out of town on Friday. If she decides to have Dr. Rudene Christians perform the revision, he may be able to do it this week. Otherwise I'll be happy to perform the surgery for her next week. If she wants me to perform her surgery, she could be discharged home with her brace after closed reduction tomorrow and I would coordinate surgery for her as an outpatient.  Patient and her son agreed with the plan to have Dr. Rudene Christians give them a second opinion, but were in agreement with a closed reduction of her hip tomorrow morning.  Patient is aware the risks and benefits of a closed reduction having been through it before.   Thornton Park, MD   11/23/2016 10:08 PM

## 2016-11-23 NOTE — ED Notes (Signed)
Patient transported to X-ray 

## 2016-11-23 NOTE — ED Notes (Signed)
Called the lab to come and draw blood - pt is a hard stick and this nurse and the er tech Morey Hummingbird could not obtain sample

## 2016-11-23 NOTE — ED Provider Notes (Signed)
[facility] Emergency Department Provider Note   ____________________________________________   First MD Initiated Contact with Patient 11/23/16 1619     (approximate)  I have reviewed the triage vital signs and the nursing notes.   HISTORY  Chief Complaint Hip Pain    HPI Jacqueline Sanchez is a 83 y.o. female presents for evaluation of left hip pain  In her normal state of health, she got up from the toilet and experienced sudden severe pain in the left hip joint. She feels like it is dislocated again. No numbness tingling or weakness in the foot. Reports sharp pain severe in the left hip, but this pain has improved down to about a 5 from a 10 after receiving 100 g of fentanyl by EMS  No fall or injury, pain started suddenly while trying to get up from the toilet  No nausea vomiting. No recent illness. Had similar dislocation about one month ago  Past Medical History:  Diagnosis Date  . Aphasia 04/2016   Transient---??TIA  . Asthma   . CKD (chronic kidney disease)   . Edema   . Femur fracture, left ([place]) 09/06/2016  . GERD (gastroesophageal reflux disease)   . Hyperlipidemia   . Hypertension   . Osteoporosis     Patient Active Problem List   Diagnosis Date Noted  . Recurrent posterior dislocation of hip, left 11/23/2016  . Hip dislocation, left ([place]) 11/02/2016  . HLD (hyperlipidemia) 10/12/2016  . Fracture of femoral neck, left ([place]) 09/07/2016  . Essential hypertension, benign 06/23/2016  . Generalized osteoarthritis of multiple sites 06/23/2016  . Sleep disturbance 06/23/2016  . Aphasia 04/21/2016  . CKD (chronic kidney disease), stage III 04/21/2016    Past Surgical History:  Procedure Laterality Date  . HIP ARTHROPLASTY Left 09/07/2016   Procedure: ARTHROPLASTY BIPOLAR HIP (HEMIARTHROPLASTY);  Surgeon: Thornton Park, MD;  Location: [facility] ORS;  Service: Orthopedics;  Laterality: Left;  . HIP CLOSED REDUCTION Left 11/03/2016   Procedure: CLOSED REDUCTION HIP;  Surgeon: Thornton Park, MD;  Location: [facility] ORS;  Service: Orthopedics;  Laterality: Left;  . KYPHOPLASTY      Prior to Admission medications   Medication Sig Start Date End Date Taking? Authorizing Provider  acetaminophen (TYLENOL) 325 MG tablet Take 2 tablets (650 mg total) by mouth every 6 (six) hours as needed for mild pain. 09/10/16  Yes Henreitta Leber, MD  amLODipine (NORVASC) 10 MG tablet Take 1 tablet (10 mg total) by mouth daily. 04/23/16  Yes Fritzi Mandes, MD  aspirin EC 81 MG tablet Take 81 mg by mouth every other day.   Yes [provider]  atorvastatin (LIPITOR) 80 MG tablet Take 1 tablet by mouth daily.   Yes [provider]  ferrous sulfate 325 (65 FE) MG tablet Take 325 mg by mouth daily with breakfast.   Yes [provider]  hydrALAZINE (APRESOLINE) 25 MG tablet Take 1 tablet (25 mg total) by mouth 4 (four) times daily. 04/23/16  Yes Fritzi Mandes, MD  losartan (COZAAR) 50 MG tablet Take 1 tablet by mouth daily. 03/08/16  Yes [provider]  Melatonin 3 MG TABS Take 1 tablet by mouth at bedtime.   Yes [provider]  metoprolol succinate (TOPROL-XL) 50 MG 24 hr tablet Take 1 tablet (50 mg total) by mouth daily. Take with or immediately following a meal. 04/23/16  Yes Fritzi Mandes, MD  Multiple Vitamin (MULTI-VITAMINS) TABS Take 1 tablet by mouth daily.   Yes [provider]  oxyCODONE-acetaminophen (PERCOCET/ROXICET) 5-325 MG tablet Take 1 tablet by mouth every 6 (six) hours as needed for severe pain. 11/04/16  Yes Mody, Ulice Bold, MD  polyethylene glycol (MIRALAX / GLYCOLAX) packet Take 17 g by mouth daily.   Yes [provider]  senna (SENOKOT) 8.6 MG tablet Take 2 tablets by mouth 2 (two) times daily.    Yes [provider]  vitamin B-12 (CYANOCOBALAMIN) 1000 MCG tablet Take 1,000 mcg by mouth daily.   Yes [provider]  Vitamin D, Cholecalciferol, 1000 units CAPS  Take 1 capsule by mouth daily.   Yes [provider]  enoxaparin (LOVENOX) 30 MG/0.3ML injection Inject 0.3 mLs (30 mg total) into the skin daily. 11/04/16 11/18/16  Bettey Costa, MD    Allergies Morphine and related  Family History  Problem Relation Age of Onset  . Hypertension Other     Social History Social History  Substance Use Topics  . Smoking status: Never Smoker  . Smokeless tobacco: Never Used  . Alcohol use No    Review of Systems Constitutional: No fever/chills Eyes: No visual changes. ENT: No sore throat. Cardiovascular: Denies chest pain. Respiratory: Denies shortness of breath. Gastrointestinal: No abdominal pain.  No nausea, no vomiting.  No diarrhea.  No constipation. Genitourinary: Negative for dysuria. Musculoskeletal: Negative for back pain. Skin: Negative for rash. Neurological: Negative for headaches, focal weakness or numbness.    ____________________________________________   PHYSICAL EXAM:  VITAL SIGNS: ED Triage Vitals  Enc Vitals Group     BP 11/23/16 1614 (!) 178/67     Pulse Rate 11/23/16 1614 87     Resp 11/23/16 1614 16     Temp 11/23/16 1614 98.9 F (37.2 C)     Temp Source 11/23/16 1614 Oral     SpO2 11/23/16 1611 100 %     Weight 11/23/16 1614 126 lb (57.2 kg)     Height 11/23/16 1614 5\' 4"  (1.626 m)     Head Circumference --      Peak Flow --      Pain Score 11/23/16 1613 6     Pain Loc --      Pain Edu? --      Excl. in [place]? --     Constitutional: Alert and oriented. Well appearing and in no acute distress. Eyes: Conjunctivae are normal. Head: Atraumatic. Nose: No congestion/rhinnorhea. Mouth/Throat: Mucous membranes are moist. Neck: No stridor.   Cardiovascular: Normal rate, regular rhythm. Grossly normal heart sounds.  Good peripheral circulation. Respiratory: Normal respiratory effort.  No retractions. Lungs CTAB. Gastrointestinal: Soft and nontender. No distention. Musculoskeletal: Right lower extremity  atraumatic. Left lower extremity has a brace on it, the leg is held in slight internal rotation and is noted to be of about 1 inch shorter than the right. Strong dorsalis pedis pulse. Normal sensation across the foot with normal dorsi and plantar flexion. Neurologic:  Normal speech and language. No gross focal neurologic deficits are appreciated.  Skin:  Skin is warm, dry and intact. No rash noted. Psychiatric: Mood and affect are normal. Speech and behavior are normal.  ____________________________________________   LABS (all labs ordered are listed, but only abnormal results are displayed)  Labs Reviewed  URINE CULTURE  CBC  BASIC METABOLIC PANEL  PROTIME-INR  APTT  CBG MONITORING, ED   ____________________________________________  EKG   ____________________________________________  RADIOLOGY  Dg Hip Unilat W Or Wo Pelvis 2-3 Views Left  Result Date: 11/23/2016 CLINICAL DATA:  Left hip pain EXAM: DG HIP (  WITH OR WITHOUT PELVIS) 2-3V LEFT COMPARISON:  11/03/2016 FINDINGS: Frontal pelvis with AP and frog-leg lateral views of the left hip show cranial dislocation of the humeral component. Bones are diffusely demineralized. Patient is status post lower lumbar vertebral augmentation. IMPRESSION: Left femoral component is dislocated. Electronically Signed   By: Misty Stanley M.D.   On: 11/23/2016 16:56    ____________________________________________   PROCEDURES  Procedure(s) performed: None  Procedures  Critical Care performed: No  ____________________________________________   INITIAL IMPRESSION / ASSESSMENT AND PLAN / ED COURSE  Pertinent labs & imaging results that were available during my care of the patient were reviewed by me and considered in my medical decision making (see chart for details).  Nontraumatic left hip dislocation, artificial hip.  Clinical Course as of Nov 23 1757  Wed Nov 23, 2016  1749 Patient very difficult vascular access, nursing and lap  attempting to obtain labs. She is awake alert and hemodynamically stable. Admit to orthopedics  [MQ]    Clinical Course User Index [MQ] Delman Kitten, MD   Spoke with orthopedics Dr. Mack Guise. He will be admitting patient to the hospital under his service. Advises no intervention at this time other than keep patient nothing by mouth  Pain well-controlled with Tylenol.  ----------------------------------------- 6:00 PM on 11/23/2016 -----------------------------------------  Awake and alert, patient resting comfortably agreeable with plan of care and admission. ____________________________________________   FINAL CLINICAL IMPRESSION(S) / ED DIAGNOSES  Final diagnoses:  Closed dislocation of left hip, initial encounter ([place])      NEW MEDICATIONS STARTED DURING THIS VISIT:  New Prescriptions   No medications on file     Note:  This document was prepared using Dragon voice recognition software and may include unintentional dictation errors.     Delman Kitten, MD 11/23/16 [phone]

## 2016-11-23 NOTE — ED Notes (Signed)
Pt arrived via ems for c/o left hip pain - Pt is at [facility] post left hip replacement and was set to be discharged home tomorrow - she went to sit down on the toilet and the lid was up and she slipped into the toilet causing her to injure her left hip again - the left leg is noted to be shortened and rotated inward

## 2016-11-24 ENCOUNTER — Encounter
Admission: EM | Disposition: A | Discharge status: Skilled Nursing Facility | Payer: Self-pay | Source: Home / Self Care | Attending: Emergency Medicine

## 2016-11-24 ENCOUNTER — Observation Stay: Payer: Medicare Other | Admitting: Registered Nurse

## 2016-11-24 ENCOUNTER — Observation Stay: Payer: Medicare Other

## 2016-11-24 DIAGNOSIS — I10 Essential (primary) hypertension: Secondary | ICD-10-CM | POA: Diagnosis not present

## 2016-11-24 DIAGNOSIS — K219 Gastro-esophageal reflux disease without esophagitis: Secondary | ICD-10-CM | POA: Diagnosis not present

## 2016-11-24 DIAGNOSIS — S73005A Unspecified dislocation of left hip, initial encounter: Secondary | ICD-10-CM | POA: Diagnosis not present

## 2016-11-24 DIAGNOSIS — Z96642 Presence of left artificial hip joint: Secondary | ICD-10-CM | POA: Diagnosis not present

## 2016-11-24 DIAGNOSIS — T84021A Dislocation of internal left hip prosthesis, initial encounter: Secondary | ICD-10-CM | POA: Diagnosis not present

## 2016-11-24 DIAGNOSIS — J45909 Unspecified asthma, uncomplicated: Secondary | ICD-10-CM | POA: Diagnosis not present

## 2016-11-24 DIAGNOSIS — Z471 Aftercare following joint replacement surgery: Secondary | ICD-10-CM | POA: Diagnosis not present

## 2016-11-24 HISTORY — PX: HIP CLOSED REDUCTION: SHX983

## 2016-11-24 LAB — MRSA PCR SCREENING: MRSA BY PCR: NEGATIVE

## 2016-11-24 SURGERY — CLOSED REDUCTION, HIP
Anesthesia: General | Site: Hip | Laterality: Left | Wound class: Clean

## 2016-11-24 MED ORDER — SODIUM CHLORIDE 0.9 % IV SOLN
75.0000 mL/h | INTRAVENOUS | Status: DC
  Administered 2016-11-24: 75 mL/h via INTRAVENOUS

## 2016-11-24 MED ORDER — LIDOCAINE HCL (PF) 2 % IJ SOLN
INTRAMUSCULAR | Status: DC | PRN
  Administered 2016-11-24: 50 mg via INTRADERMAL

## 2016-11-24 MED ORDER — POLYETHYLENE GLYCOL 3350 17 G PO PACK
17.0000 g | PACK | Freq: Every day | ORAL | Status: DC
  Administered 2016-11-25: 17 g via ORAL
  Filled 2016-11-24: qty 1

## 2016-11-24 MED ORDER — FERROUS SULFATE 325 (65 FE) MG PO TABS
325.0000 mg | ORAL_TABLET | Freq: Every day | ORAL | Status: DC
  Administered 2016-11-25: 325 mg via ORAL
  Filled 2016-11-24: qty 1

## 2016-11-24 MED ORDER — LOSARTAN POTASSIUM 50 MG PO TABS
50.0000 mg | ORAL_TABLET | Freq: Every day | ORAL | Status: DC
  Administered 2016-11-25: 50 mg via ORAL
  Filled 2016-11-24: qty 1

## 2016-11-24 MED ORDER — MELATONIN 3 MG PO TABS
1.0000 | ORAL_TABLET | Freq: Every day | ORAL | Status: DC
  Filled 2016-11-24: qty 1

## 2016-11-24 MED ORDER — ONDANSETRON HCL 4 MG/2ML IJ SOLN
INTRAMUSCULAR | Status: AC
  Filled 2016-11-24: qty 2

## 2016-11-24 MED ORDER — LACTATED RINGERS IV SOLN
INTRAVENOUS | Status: DC | PRN
  Administered 2016-11-24: 12:00:00 via INTRAVENOUS

## 2016-11-24 MED ORDER — HYDRALAZINE HCL 25 MG PO TABS
25.0000 mg | ORAL_TABLET | Freq: Four times a day (QID) | ORAL | Status: DC
  Administered 2016-11-24 (×2): 25 mg via ORAL
  Filled 2016-11-24 (×3): qty 1

## 2016-11-24 MED ORDER — ASPIRIN EC 325 MG PO TBEC
325.0000 mg | DELAYED_RELEASE_TABLET | Freq: Every day | ORAL | Status: DC
  Administered 2016-11-25: 325 mg via ORAL
  Filled 2016-11-24: qty 1

## 2016-11-24 MED ORDER — FENTANYL CITRATE (PF) 100 MCG/2ML IJ SOLN: 25.0000 ug | INTRAMUSCULAR | Status: DC | PRN

## 2016-11-24 MED ORDER — METOPROLOL SUCCINATE ER 50 MG PO TB24
50.0000 mg | ORAL_TABLET | Freq: Every day | ORAL | Status: DC
  Administered 2016-11-25: 50 mg via ORAL
  Filled 2016-11-24: qty 1

## 2016-11-24 MED ORDER — DOCUSATE SODIUM 100 MG PO CAPS
100.0000 mg | ORAL_CAPSULE | Freq: Two times a day (BID) | ORAL | Status: DC
  Administered 2016-11-24 – 2016-11-25 (×2): 100 mg via ORAL
  Filled 2016-11-24 (×2): qty 1

## 2016-11-24 MED ORDER — AMLODIPINE BESYLATE 10 MG PO TABS
10.0000 mg | ORAL_TABLET | Freq: Every day | ORAL | Status: DC
  Administered 2016-11-25: 10 mg via ORAL
  Filled 2016-11-24: qty 1

## 2016-11-24 MED ORDER — FENTANYL CITRATE (PF) 100 MCG/2ML IJ SOLN
INTRAMUSCULAR | Status: AC
  Filled 2016-11-24: qty 2

## 2016-11-24 MED ORDER — ACETAMINOPHEN 325 MG PO TABS: 650.0000 mg | ORAL_TABLET | Freq: Four times a day (QID) | ORAL | Status: DC | PRN

## 2016-11-24 MED ORDER — FENTANYL CITRATE (PF) 100 MCG/2ML IJ SOLN
INTRAMUSCULAR | Status: DC | PRN
  Administered 2016-11-24: 25 ug via INTRAVENOUS
  Administered 2016-11-24: 50 ug via INTRAVENOUS

## 2016-11-24 MED ORDER — VITAMIN D 1000 UNITS PO TABS
1000.0000 [IU] | ORAL_TABLET | Freq: Every day | ORAL | Status: DC
  Administered 2016-11-25: 1000 [IU] via ORAL
  Filled 2016-11-24: qty 1

## 2016-11-24 MED ORDER — LIDOCAINE HCL (PF) 2 % IJ SOLN
INTRAMUSCULAR | Status: AC
  Filled 2016-11-24: qty 2

## 2016-11-24 MED ORDER — ONDANSETRON HCL 4 MG/2ML IJ SOLN: 4.0000 mg | Freq: Four times a day (QID) | INTRAMUSCULAR | Status: DC | PRN

## 2016-11-24 MED ORDER — MELATONIN 5 MG PO TABS
5.0000 mg | ORAL_TABLET | Freq: Every day | ORAL | Status: DC
  Filled 2016-11-24 (×2): qty 1

## 2016-11-24 MED ORDER — PROPOFOL 10 MG/ML IV BOLUS
INTRAVENOUS | Status: DC | PRN
  Administered 2016-11-24 (×2): 10 mg via INTRAVENOUS
  Administered 2016-11-24: 30 mg via INTRAVENOUS
  Administered 2016-11-24: 10 mg via INTRAVENOUS

## 2016-11-24 MED ORDER — ALUM & MAG HYDROXIDE-SIMETH 200-200-20 MG/5ML PO SUSP: 30.0000 mL | ORAL | Status: DC | PRN

## 2016-11-24 MED ORDER — ONDANSETRON HCL 4 MG/2ML IJ SOLN: 4.0000 mg | Freq: Once | INTRAMUSCULAR | Status: DC | PRN

## 2016-11-24 MED ORDER — ONDANSETRON HCL 4 MG PO TABS: 4.0000 mg | ORAL_TABLET | Freq: Four times a day (QID) | ORAL | Status: DC | PRN

## 2016-11-24 MED ORDER — PROPOFOL 10 MG/ML IV BOLUS
INTRAVENOUS | Status: AC
  Filled 2016-11-24: qty 20

## 2016-11-24 MED ORDER — SENNA 8.6 MG PO TABS
2.0000 | ORAL_TABLET | Freq: Two times a day (BID) | ORAL | Status: DC
  Administered 2016-11-25: 17.2 mg via ORAL
  Filled 2016-11-24 (×2): qty 2

## 2016-11-24 MED ORDER — VITAMIN B-12 1000 MCG PO TABS
1000.0000 ug | ORAL_TABLET | Freq: Every day | ORAL | Status: DC
  Administered 2016-11-25: 1000 ug via ORAL
  Filled 2016-11-24: qty 1

## 2016-11-24 MED ORDER — OXYCODONE-ACETAMINOPHEN 5-325 MG PO TABS
1.0000 | ORAL_TABLET | Freq: Four times a day (QID) | ORAL | Status: DC | PRN
  Administered 2016-11-24 – 2016-11-25 (×4): 1 via ORAL
  Filled 2016-11-24 (×4): qty 1

## 2016-11-24 MED ORDER — ONDANSETRON HCL 4 MG/2ML IJ SOLN
INTRAMUSCULAR | Status: DC | PRN
  Administered 2016-11-24: 4 mg via INTRAVENOUS

## 2016-11-24 MED ORDER — ADULT MULTIVITAMIN W/MINERALS CH
1.0000 | ORAL_TABLET | Freq: Every day | ORAL | Status: DC
  Administered 2016-11-25: 1 via ORAL
  Filled 2016-11-24: qty 1

## 2016-11-24 MED ORDER — ATORVASTATIN CALCIUM 80 MG PO TABS
80.0000 mg | ORAL_TABLET | Freq: Every day | ORAL | Status: DC
  Administered 2016-11-25: 80 mg via ORAL
  Filled 2016-11-24: qty 4
  Filled 2016-11-24 (×2): qty 1

## 2016-11-24 SURGICAL SUPPLY — 5 items
GLOVE BIOGEL PI IND STRL 9 (GLOVE) IMPLANT
GLOVE BIOGEL PI INDICATOR 9 (GLOVE)
GOWN STRL REUS TWL 2XL XL LVL4 (GOWN DISPOSABLE) IMPLANT
KIT RM TURNOVER STRD PROC AR (KITS) ×3 IMPLANT
PILLOW ABDUC SM (MISCELLANEOUS) IMPLANT

## 2016-11-24 NOTE — Progress Notes (Signed)
Subjective:  Patient reports hip pain as mild to moderate.  She was seen in the preoperative area. She is having another IV started as her previous 1 infiltrated. Her family is at the bedside.  Objective:   VITALS:   Vitals:   11/23/16 2026 11/23/16 2300 11/24/16 0723 11/24/16 1102  BP:  (!) 160/52 (!) 157/66 (!) 165/74  Pulse:  77 74 88  Resp:  16 18 16   Temp:  97.8 F (36.6 C) 97.9 F (36.6 C) (!) 96.9 F (36.1 C)  TempSrc:  Oral Oral Tympanic  SpO2:  96% 98% 97%  Weight: 58.2 kg (128 lb 3.2 oz)     Height:        PHYSICAL EXAM:  Left lower extremity: Patient's skin is intact. There is no erythema and minimal ecchymosis. Her thigh and leg compartments are soft and compressible. Neurovascular intact Sensation intact distally Intact pulses distally Dorsiflexion/Plantar flexion intact No cellulitis present Compartment soft  LABS  Results for orders placed or performed during the hospital encounter of 11/23/16 (from the past 24 hour(s))  CBC     Status: Abnormal   Collection Time: 11/23/16  6:24 PM  Result Value Ref Range   WBC 9.6 3.6 - 11.0 K/uL   RBC 3.77 (L) 3.80 - 5.20 MIL/uL   Hemoglobin 13.1 12.0 - 16.0 g/dL   HCT 37.1 35.0 - 47.0 %   MCV 98.4 80.0 - 100.0 fL   MCH 34.7 (H) 26.0 - 34.0 pg   MCHC 35.2 32.0 - 36.0 g/dL   RDW 14.5 11.5 - 14.5 %   Platelets 373 150 - 440 K/uL  Basic metabolic panel     Status: Abnormal   Collection Time: 11/23/16  6:24 PM  Result Value Ref Range   Sodium 136 135 - 145 mmol/L   Potassium 4.5 3.5 - 5.1 mmol/L   Chloride 103 101 - 111 mmol/L   CO2 25 22 - 32 mmol/L   Glucose, Bld 112 (H) 65 - 99 mg/dL   BUN 24 (H) 6 - 20 mg/dL   Creatinine, Ser 1.14 (H) 0.44 - 1.00 mg/dL   Calcium 10.5 (H) 8.9 - 10.3 mg/dL   GFR calc non Af Amer 40 (L) >60 mL/min   GFR calc Af Amer 46 (L) >60 mL/min   Anion gap 8 5 - 15  Protime-INR     Status: None   Collection Time: 11/23/16  6:24 PM  Result Value Ref Range   Prothrombin Time 11.6  11.4 - 15.2 seconds   INR 0.85   APTT     Status: None   Collection Time: 11/23/16  6:24 PM  Result Value Ref Range   aPTT 28 24 - 36 seconds  MRSA PCR Screening     Status: None   Collection Time: 11/23/16  8:15 PM  Result Value Ref Range   MRSA by PCR NEGATIVE NEGATIVE    Dg Hip Unilat W Or Wo Pelvis 2-3 Views Left  Result Date: 11/23/2016 CLINICAL DATA:  Left hip pain EXAM: DG HIP (WITH OR WITHOUT PELVIS) 2-3V LEFT COMPARISON:  11/03/2016 FINDINGS: Frontal pelvis with AP and frog-leg lateral views of the left hip show cranial dislocation of the humeral component. Bones are diffusely demineralized. Patient is status post lower lumbar vertebral augmentation. IMPRESSION: Left femoral component is dislocated. Electronically Signed   By: Misty Stanley M.D.   On: 11/23/2016 16:56    Assessment/Plan: Day of Surgery   Active Problems:   Recurrent posterior dislocation of  hip, left  I splinted the patient and her family that the plan will be for closed reduction today. I spoke with Dr. Hessie Knows at their request. He has agreed to stop by and see the patient and discussed with her a revision surgical plan.  I discussed with the patient's family the risks of revision total hip arthroplasty. In agreement with the plan for closed reduction today. Patient is aware of the risks and benefits of procedure. I signed the left hip according the hospital's correct site of surgery protocol.    Thornton Park , MD 11/24/2016, 11:36 AM

## 2016-11-24 NOTE — Progress Notes (Signed)
Patient seen and discussed possible revision to THA, anterior approach. Will proceed next Thursday

## 2016-11-24 NOTE — Transfer of Care (Signed)
Immediate Anesthesia Transfer of Care Note  Patient: Jacqueline Sanchez  Procedure(s) Performed: Procedure(s): CLOSED REDUCTION HIP (Left)  Patient Location: PACU  Anesthesia Type:General  Level of Consciousness: awake, alert  and oriented  Airway & Oxygen Therapy: Patient Spontanous Breathing and Patient connected to nasal cannula oxygen  Post-op Assessment: Report given to RN and Post -op Vital signs reviewed and stable  Post vital signs: Reviewed and stable  Last Vitals:  Vitals:   11/24/16 1102 11/24/16 1210  BP: (!) 165/74 (!) 147/67  Pulse: 88 85  Resp: 16 18  Temp: (!) 36.1 C 37 C    Last Pain:  Vitals:   11/24/16 1102  TempSrc: Tympanic  PainSc:       Patients Stated Pain Goal: 2 (26/94/85 4627)  Complications: No apparent anesthesia complications

## 2016-11-24 NOTE — NC FL2 (Signed)
[place] LEVEL OF CARE SCREENING TOOL     IDENTIFICATION  Patient Name: Jacqueline Sanchez Birthdate: 22-Jul-1919 Sex: female Admission Date (Current Location): 11/23/2016  [place] and [place] Number:  Engineering geologist and Address:  [facility], [address], [place], [place] [address]      Provider Number: [id number]  Attending Physician Name and Address:  Thornton Park, MD  Relative Name and Phone Number:       Current Level of Care: Hospital Recommended Level of Care: [place] Prior Approval Number:    Date Approved/Denied:   PASRR Number:  ([id number] )  Discharge Plan: SNF    Current Diagnoses: Patient Active Problem List   Diagnosis Date Noted  . Recurrent posterior dislocation of hip, left 11/23/2016  . Hip dislocation, left ([place]) 11/02/2016  . HLD (hyperlipidemia) 10/12/2016  . Fracture of femoral neck, left ([place]) 09/07/2016  . Essential hypertension, benign 06/23/2016  . Generalized osteoarthritis of multiple sites 06/23/2016  . Sleep disturbance 06/23/2016  . Aphasia 04/21/2016  . CKD (chronic kidney disease), stage III 04/21/2016    Orientation RESPIRATION BLADDER Height & Weight     Self, Time, Situation, Place  Normal Continent Weight: 128 lb 3.2 oz (58.2 kg) Height:  5\' 4"  (162.6 cm)  BEHAVIORAL SYMPTOMS/MOOD NEUROLOGICAL BOWEL NUTRITION STATUS   (none)  (none) Continent Diet (Regular Diet )  AMBULATORY STATUS COMMUNICATION OF NEEDS Skin   Extensive Assist Verbally Surgical wounds (Left Hip. )                       Personal Care Assistance Level of Assistance  Bathing, Feeding, Dressing Bathing Assistance: Limited assistance Feeding assistance: Independent Dressing Assistance: Limited assistance     Functional Limitations Info  Sight, Hearing, Speech Sight Info: Adequate Hearing Info: Impaired Speech Info: Adequate    SPECIAL CARE FACTORS FREQUENCY  PT (By licensed PT),  OT (By licensed OT)     PT Frequency:  (5) OT Frequency:  (5)            Contractures      Additional Factors Info  Code Status, Allergies Code Status Info:  (DNR ) Allergies Info:  (Morphine and Related )           Current Medications (11/24/2016):  This is the current hospital active medication list Current Facility-Administered Medications  Medication Dose Route Frequency Provider Last Rate Last Dose  . 0.9 %  sodium chloride infusion   Intravenous Continuous Thornton Park, MD      . Doug Sou Hold] bisacodyl (DULCOLAX) suppository 10 mg  10 mg Rectal Daily PRN Thornton Park, MD      . Doug Sou Hold] docusate sodium (COLACE) capsule 100 mg  100 mg Oral BID Thornton Park, MD   100 mg at 11/23/16 2034  . fentaNYL (SUBLIMAZE) injection 25 mcg  25 mcg Intravenous Q5 min PRN Alvin Critchley, MD      . Doug Sou Hold] HYDROcodone-acetaminophen (NORCO/VICODIN) 5-325 MG per tablet 1-2 tablet  1-2 tablet Oral Q6H PRN Thornton Park, MD   2 tablet at 11/24/16 [phone]  . [MAR Hold] magnesium citrate solution 1 Bottle  1 Bottle Oral Once PRN Thornton Park, MD      . Doug Sou Hold] methocarbamol (ROBAXIN) tablet 500 mg  500 mg Oral Q6H PRN Thornton Park, MD       Or  . Doug Sou Hold] methocarbamol (ROBAXIN) 500 mg in dextrose 5 % 50 mL IVPB  500 mg  Intravenous Q6H PRN Thornton Park, MD      . Doug Sou Hold] morphine 2 MG/ML injection 2 mg  2 mg Intravenous Q2H PRN Thornton Park, MD      . ondansetron [facility]) injection 4 mg  4 mg Intravenous Once PRN Alvin Critchley, MD      . Doug Sou Hold] polyethylene glycol (MIRALAX / GLYCOLAX) packet 17 g  17 g Oral Daily PRN Thornton Park, MD         Discharge Medications: Please see discharge summary for a list of discharge medications.  Relevant Imaging Results:  Relevant Lab Results:   Additional Information  (SSN: [id number])  Carie Kapuscinski, Veronia Beets, LCSW

## 2016-11-24 NOTE — Care Management Obs Status (Signed)
[facility] NOTIFICATION   Patient Details  Name: Jacqueline Sanchez MRN: [id number] Date of Birth: [birth year]   Medicare Observation Status Notification Given:  Yes    Jolly Mango, RN 11/24/2016, 9:10 AM

## 2016-11-24 NOTE — Anesthesia Postprocedure Evaluation (Signed)
Anesthesia Post Note  Patient: Jacqueline Sanchez  Procedure(s) Performed: Procedure(s) (LRB): CLOSED REDUCTION HIP (Left)  Patient location during evaluation: PACU Anesthesia Type: General Level of consciousness: awake and alert and oriented Pain management: pain level controlled Vital Signs Assessment: post-procedure vital signs reviewed and stable Respiratory status: spontaneous breathing Cardiovascular status: blood pressure returned to baseline Anesthetic complications: no     Last Vitals:  Vitals:   11/24/16 1225 11/24/16 1240  BP: (!) 164/65 (!) 135/52  Pulse: 82 72  Resp: 15 14  Temp:  37.4 C    Last Pain:  Vitals:   11/24/16 1102  TempSrc: Tympanic  PainSc:     LLE Motor Response: Purposeful movement (11/24/16 1240) LLE Sensation: Full sensation (11/24/16 1240)          Adama Ferber

## 2016-11-24 NOTE — Progress Notes (Addendum)
A&OX4. VSS. Oriented to unit. Pt has spoken to Dr. Mack Guise and consent signed. Son at bedside, Jewelery sent home with him, 3 X gold toned rings and 1 gold toned watch. LLE has shortening and is rotated inward. Full sensation to lower extremities bilaterally and can wiggle toes. Bruising noted on arms and legs bilaterally. Pt performed incentive spirometry. No adventitious heart or lung sounds. [place] is sufficient for pain at this time per pt.

## 2016-11-24 NOTE — Progress Notes (Signed)
Pt back from PACU, tray ordered, no complaints of pain at this time. Will continue to monitor.  Bethann Punches, RN

## 2016-11-24 NOTE — Plan of Care (Signed)
Problem: Safety: Goal: Ability to remain free from injury will improve Outcome: Progressing No injuries this shift, call bell in reach, bed in lowest position and bed alarm activated.

## 2016-11-24 NOTE — Progress Notes (Signed)
Subjective:  POST OP CHECK:  Patient seen in her hospital room. Her son is at the bedside. Patient reports left hip pain as mild.  Patient is in her abduction brace.  Objective:   VITALS:   Vitals:   11/24/16 1102 11/24/16 1210 11/24/16 1225 11/24/16 1240  BP: (!) 165/74 (!) 147/67 (!) 164/65 (!) 135/52  Pulse: 88 85 82 72  Resp: 16 18 15 14   Temp: (!) 96.9 F (36.1 C) 98.6 F (37 C)  99.4 F (37.4 C)  TempSrc: Tympanic     SpO2: 97% 99% 100% 93%  Weight:      Height:        PHYSICAL EXAM: Left lower extremity: Neurovascular intact Sensation intact distally Intact pulses distally Dorsiflexion/Plantar flexion intact No cellulitis present Compartment soft  LABS  Results for orders placed or performed during the hospital encounter of 11/23/16 (from the past 24 hour(s))  CBC     Status: Abnormal   Collection Time: 11/23/16  6:24 PM  Result Value Ref Range   WBC 9.6 3.6 - 11.0 K/uL   RBC 3.77 (L) 3.80 - 5.20 MIL/uL   Hemoglobin 13.1 12.0 - 16.0 g/dL   HCT 37.1 35.0 - 47.0 %   MCV 98.4 80.0 - 100.0 fL   MCH 34.7 (H) 26.0 - 34.0 pg   MCHC 35.2 32.0 - 36.0 g/dL   RDW 14.5 11.5 - 14.5 %   Platelets 373 150 - 440 K/uL  Basic metabolic panel     Status: Abnormal   Collection Time: 11/23/16  6:24 PM  Result Value Ref Range   Sodium 136 135 - 145 mmol/L   Potassium 4.5 3.5 - 5.1 mmol/L   Chloride 103 101 - 111 mmol/L   CO2 25 22 - 32 mmol/L   Glucose, Bld 112 (H) 65 - 99 mg/dL   BUN 24 (H) 6 - 20 mg/dL   Creatinine, Ser 1.14 (H) 0.44 - 1.00 mg/dL   Calcium 10.5 (H) 8.9 - 10.3 mg/dL   GFR calc non Af Amer 40 (L) >60 mL/min   GFR calc Af Amer 46 (L) >60 mL/min   Anion gap 8 5 - 15  Protime-INR     Status: None   Collection Time: 11/23/16  6:24 PM  Result Value Ref Range   Prothrombin Time 11.6 11.4 - 15.2 seconds   INR 0.85   APTT     Status: None   Collection Time: 11/23/16  6:24 PM  Result Value Ref Range   aPTT 28 24 - 36 seconds  MRSA PCR Screening      Status: None   Collection Time: 11/23/16  8:15 PM  Result Value Ref Range   MRSA by PCR NEGATIVE NEGATIVE    Pelvis Portable  Result Date: 11/24/2016 CLINICAL DATA:  Postop ORIF EXAM: PORTABLE PELVIS 1-2 VIEWS COMPARISON:  09/07/2016 FINDINGS: Changes of left hip replacement. Normal AP alignment. No hardware bony complicating feature. IMPRESSION: Left hip replacement.  No complicating feature. Electronically Signed   By: Rolm Baptise M.D.   On: 11/24/2016 12:58   Dg Hip Operative Unilat W Or W/o Pelvis Left  Result Date: 11/24/2016 CLINICAL DATA:  Hip dislocation EXAM: OPERATIVE left HIP (WITH PELVIS IF PERFORMED) 2 VIEWS TECHNIQUE: Fluoroscopic spot image(s) were submitted for interpretation post-operatively. COMPARISON:  Radiography from yesterday FINDINGS: Two images of the left hip show relocation of the hemiarthroplasty. No visible fracture. IMPRESSION: Intraoperative fluoroscopy showing relocated left hip hemiarthroplasty. Electronically Signed   By: Angelica Chessman  Watts M.D.   On: 11/24/2016 12:20   Dg Hip Unilat W Or Wo Pelvis 2-3 Views Left  Result Date: 11/23/2016 CLINICAL DATA:  Left hip pain EXAM: DG HIP (WITH OR WITHOUT PELVIS) 2-3V LEFT COMPARISON:  11/03/2016 FINDINGS: Frontal pelvis with AP and frog-leg lateral views of the left hip show cranial dislocation of the humeral component. Bones are diffusely demineralized. Patient is status post lower lumbar vertebral augmentation. IMPRESSION: Left femoral component is dislocated. Electronically Signed   By: Misty Stanley M.D.   On: 11/23/2016 16:56    Assessment/Plan: Day of Surgery   Active Problems:   Recurrent posterior dislocation of hip, left   Patient is doing well postop. She will continue wearing her abduction brace at all times. I've contacted [employer] who was going to stop by to evaluate the brace and make any necessary adjustments. She will observe posterior hip precautions. Dr. Hessie Knows will stop by to speak with the  patient and her family regarding possible revision hip surgery through an anterior approach.  Patient be discharged back to [facility].   Thornton Park , MD 11/24/2016, 1:50 PM

## 2016-11-24 NOTE — Anesthesia Preprocedure Evaluation (Addendum)
Anesthesia Evaluation  Patient identified by MRN, date of birth, ID band Patient awake    Reviewed: Allergy & Precautions, NPO status , Patient's Chart, lab work & pertinent test results, reviewed documented beta blocker date and time   History of Anesthesia Complications Negative for: history of anesthetic complications  Airway Mallampati: III       Dental  (+) Chipped, Caps   Pulmonary neg pulmonary ROS, asthma ,    Pulmonary exam normal        Cardiovascular hypertension, Pt. on medications and Pt. on home beta blockers Normal cardiovascular exam     Neuro/Psych negative neurological ROS     GI/Hepatic Neg liver ROS, GERD  Medicated,  Endo/Other  negative endocrine ROS  Renal/GU Renal InsufficiencyRenal disease     Musculoskeletal   Abdominal Normal abdominal exam  (+)   Peds  Hematology   Anesthesia Other Findings   Reproductive/Obstetrics                             Anesthesia Physical  Anesthesia Plan  ASA: III  Anesthesia Plan: General   Post-op Pain Management:    Induction: Intravenous  PONV Risk Score and Plan:   Airway Management Planned: Mask  Additional Equipment:   Intra-op Plan:   Post-operative Plan:   Informed Consent: I have reviewed the patients History and Physical, chart, labs and discussed the procedure including the risks, benefits and alternatives for the proposed anesthesia with the patient or authorized representative who has indicated his/her understanding and acceptance.     Plan Discussed with: CRNA and Surgeon  Anesthesia Plan Comments:        Anesthesia Quick Evaluation

## 2016-11-24 NOTE — Clinical Social Work Note (Signed)
Clinical Social Work Assessment  Patient Details  Name: Jacqueline Sanchez MRN: [id number] Date of Birth: 01/21/1920  Date of referral:  11/24/16               Reason for consult:  Other (Comment Required) (From [facility] )                Permission sought to share information with:  Chartered certified accountant granted to share information::  Yes, Verbal Permission Granted  Name::      [place] [facility]::     Relationship::     Contact Information:     Housing/Transportation Living arrangements for the past 2 months:  [place], [place] of Information:  Patient Patient Interpreter Needed:  None Criminal Activity/Legal Involvement Pertinent to Current Situation/Hospitalization:  No - Comment as needed Significant Relationships:  Adult Children, Friend Lives with:  Facility Resident Do you feel safe going back to the place where you live?  Yes Need for family participation in patient care:  Yes (Comment)  Care giving concerns:  Patient was admitted to [facility] from [facility].    Social Worker assessment / plan:  Holiday representative (CSW) received SNF consult. Patient went to OR today for a dislocation of her left hip. CSW contacted [facility] at [facility]. Per Seth Bake patient is at [facility] and was going to be discharged back to [facility] today however plans have changed since she came back to [facility]. Per Seth Bake patient can return to [facility] and continue rehab. CSW met with patient prior to going to the OR today and her friend [name] was at bedside. CSW is familiar with patient from previous admission. Patient reported that she is agreeable to return to [facility] for rehab. Patient reported that she has 2 adult sons that live in [place]. FL2 complete.  Plan is for patient to D/C back to [facility] tomorrow pending medical clearance. CSW will continue to follow and assist as  needed.   Employment status:  Retired Forensic scientist:  Medicare PT Recommendations:  Not assessed at this time [place] / Referral to community resources:  [place]  Patient/Family's Response to care:  Patient is agreeable to return to [facility].   Patient/Family's Understanding of and Emotional Response to Diagnosis, Current Treatment, and Prognosis:  Patient was very pleasant and thanked CSW for visit.   Emotional Assessment Appearance:  Appears stated age Attitude/Demeanor/Rapport:    Affect (typically observed):  Accepting, Adaptable, Pleasant Orientation:  Oriented to Self, Oriented to Place, Oriented to  Time, Oriented to Situation Alcohol / Substance use:  Not Applicable Psych involvement (Current and /or in the community):  No (Comment)  Discharge Needs  Concerns to be addressed:  Discharge Planning Concerns Readmission within the last 30 days:  No Current discharge risk:  Dependent with Mobility Barriers to Discharge:  Continued Medical Work up   [employer], Veronia Beets, LCSW 11/24/2016, 2:08 PM

## 2016-11-24 NOTE — Care Management (Signed)
Readmit. At [facility] 2 weeks ago for same problem. From [facility]. CSW updated.

## 2016-11-24 NOTE — Anesthesia Post-op Follow-up Note (Cosign Needed)
Anesthesia QCDR form completed.        

## 2016-11-25 ENCOUNTER — Encounter: Payer: Self-pay | Admitting: Orthopedic Surgery

## 2016-11-25 DIAGNOSIS — Y792 Prosthetic and other implants, materials and accessory orthopedic devices associated with adverse incidents: Secondary | ICD-10-CM | POA: Diagnosis present

## 2016-11-25 DIAGNOSIS — M25552 Pain in left hip: Secondary | ICD-10-CM | POA: Diagnosis not present

## 2016-11-25 DIAGNOSIS — M810 Age-related osteoporosis without current pathological fracture: Secondary | ICD-10-CM | POA: Diagnosis present

## 2016-11-25 DIAGNOSIS — E785 Hyperlipidemia, unspecified: Secondary | ICD-10-CM | POA: Diagnosis present

## 2016-11-25 DIAGNOSIS — T84021A Dislocation of internal left hip prosthesis, initial encounter: Secondary | ICD-10-CM | POA: Diagnosis present

## 2016-11-25 DIAGNOSIS — I69320 Aphasia following cerebral infarction: Secondary | ICD-10-CM | POA: Diagnosis not present

## 2016-11-25 DIAGNOSIS — Z79899 Other long term (current) drug therapy: Secondary | ICD-10-CM | POA: Diagnosis not present

## 2016-11-25 DIAGNOSIS — Z7401 Bed confinement status: Secondary | ICD-10-CM | POA: Diagnosis not present

## 2016-11-25 DIAGNOSIS — M6281 Muscle weakness (generalized): Secondary | ICD-10-CM | POA: Diagnosis not present

## 2016-11-25 DIAGNOSIS — Z471 Aftercare following joint replacement surgery: Secondary | ICD-10-CM | POA: Diagnosis not present

## 2016-11-25 DIAGNOSIS — S73036D Other anterior dislocation of unspecified hip, subsequent encounter: Secondary | ICD-10-CM | POA: Diagnosis not present

## 2016-11-25 DIAGNOSIS — Z7982 Long term (current) use of aspirin: Secondary | ICD-10-CM | POA: Diagnosis not present

## 2016-11-25 DIAGNOSIS — K219 Gastro-esophageal reflux disease without esophagitis: Secondary | ICD-10-CM | POA: Diagnosis not present

## 2016-11-25 DIAGNOSIS — Z885 Allergy status to narcotic agent status: Secondary | ICD-10-CM | POA: Diagnosis not present

## 2016-11-25 DIAGNOSIS — I10 Essential (primary) hypertension: Secondary | ICD-10-CM | POA: Diagnosis not present

## 2016-11-25 DIAGNOSIS — M24452 Recurrent dislocation, left hip: Secondary | ICD-10-CM | POA: Diagnosis not present

## 2016-11-25 DIAGNOSIS — I129 Hypertensive chronic kidney disease with stage 1 through stage 4 chronic kidney disease, or unspecified chronic kidney disease: Secondary | ICD-10-CM | POA: Diagnosis present

## 2016-11-25 DIAGNOSIS — M159 Polyosteoarthritis, unspecified: Secondary | ICD-10-CM | POA: Diagnosis present

## 2016-11-25 DIAGNOSIS — N183 Chronic kidney disease, stage 3 (moderate): Secondary | ICD-10-CM | POA: Diagnosis present

## 2016-11-25 DIAGNOSIS — S73001A Unspecified subluxation of right hip, initial encounter: Secondary | ICD-10-CM | POA: Diagnosis not present

## 2016-11-25 DIAGNOSIS — J45909 Unspecified asthma, uncomplicated: Secondary | ICD-10-CM | POA: Diagnosis present

## 2016-11-25 DIAGNOSIS — Z96642 Presence of left artificial hip joint: Secondary | ICD-10-CM | POA: Diagnosis not present

## 2016-11-25 LAB — URINE CULTURE: CULTURE: NO GROWTH

## 2016-11-25 MED ORDER — TRAMADOL HCL 50 MG PO TABS: 50.0000 mg | ORAL_TABLET | Freq: Four times a day (QID) | ORAL | 2 refills | Status: DC | PRN

## 2016-11-25 NOTE — Evaluation (Addendum)
Occupational Therapy Evaluation Patient Details Name: Rayan Dyal MRN: [id number] DOB: [birth year] Today's Date: 11/25/2016    History of Present Illness Pt. is a 83 y.o. female who was admitted with a Left Hip Dislocation. Pt. underwent closed reduction of the Left hip yesterday. Pt. is planning to have a THA next week with Dr. Rudene Christians. Pt. PMHx includes: Left Hemiarthroplasty, Asthma, HLD, HTN, and CKD.   Clinical Impression   Pt. is a 83 y.o. Female who was admitted to [facility] with a Left Hip Dislocation with closed reduction. Pt. education was provided about A/E use for LE ADLs, and posterior Hip precautions. Pt. Was assisted in repositioning. Pt. Is familiar with the A/E use for LE ADLs. Pt. Has a hip abductor brace in place, and plans to have an THA surgery next week with Dr. Rudene Christians. Pt. Could benefit from OT skilled services at SNF level of care for ADL training, A/E training, UE there. Ex, and work simplification techniques. Pt. Plans to discharge to [facility] today.    Follow Up Recommendations  SNF    Equipment Recommendations       Recommendations for Other Services       Precautions / Restrictions Precautions Precautions: Posterior Hip;Fall Precaution Booklet Issued: Yes (comment) Precaution Comments: Left Hip Posterior Precautions Required Braces or Orthoses: Other Brace/Splint Other Brace/Splint: Hip abduction brace at all times Restrictions Weight Bearing Restrictions: Yes LLE Weight Bearing: Weight bearing as tolerated             ADL either performed or assessed with clinical judgement   ADL Overall ADL's : Needs assistance/impaired Eating/Feeding: Set up   Grooming: Set up       Lower Body Bathing: Maximal assistance       Lower Body Dressing: Maximal assistance                 General ADL Comments: Education was provided about Posterior Hip precations, and A/E for LE ADLs. Pt. has knowledge of A/E.     Vision Patient Visual Report: No  change from baseline       Perception     Praxis      Pertinent Vitals/Pain Pain Assessment: 0-10 Pain Score: 3  Pain Location: Left Hip Pain Descriptors / Indicators: Aching;Sore Pain Intervention(s): Limited activity within patient's tolerance;Monitored during session;Repositioned     Hand Dominance Right   Extremity/Trunk Assessment Upper Extremity Assessment Upper Extremity Assessment: Defer to OT evaluation           Communication Communication Communication: No difficulties   Cognition Arousal/Alertness: Awake/alert Behavior During Therapy: WFL for tasks assessed/performed Overall Cognitive Status: Within Functional Limits for tasks assessed                                 General Comments: 3/3 Hip Precautions   General Comments       Exercises   Shoulder Instructions      Home Living Family/patient expects to be discharged to:: Skilled nursing facility                                 Additional Comments: Resides at [facility].      Prior Functioning/Environment Level of Independence: Needs assistance    ADL's / Homemaking Assistance Needed: Assist with mroing ADL care.            OT Problem List:  Decreased range of motion;Decreased activity tolerance;Pain;Impaired UE functional use;Decreased knowledge of use of DME or AE;Decreased strength      OT Treatment/Interventions:      OT Goals(Current goals can be found in the care plan section) Acute Rehab OT Goals Patient Stated Goal: To go back to [facility] OT Goal Formulation: With patient Potential to Achieve Goals: Good  OT Frequency:     Barriers to D/C:            Co-evaluation              AM-PAC PT "6 Clicks" Daily Activity     Outcome Measure Help from another person eating meals?: None Help from another person taking care of personal grooming?: None Help from another person toileting, which includes using toliet, bedpan, or urinal?: A  Lot Help from another person bathing (including washing, rinsing, drying)?: A Lot Help from another person to put on and taking off regular upper body clothing?: A Little Help from another person to put on and taking off regular lower body clothing?: A Lot 6 Click Score: 17   End of Session    Activity Tolerance: Patient tolerated treatment well Patient left: in chair;with call bell/phone within reach  OT Visit Diagnosis: Muscle weakness (generalized) (M62.81);Repeated falls (R29.6)                Time: 0786-7544 OT Time Calculation (min): 13 min Charges:  OT General Charges $OT Visit: 1 Procedure OT Evaluation $OT Eval Low Complexity: 1 Procedure G-Codes: OT G-codes **NOT FOR INPATIENT CLASS** Functional Assessment Tool Used: AM-PAC 6 Clicks Daily Activity Functional Limitation: Self care Self Care Current Status (B2010): At least 40 percent but less than 60 percent impaired, limited or restricted Self Care Goal Status (O7121): At least 20 percent but less than 40 percent impaired, limited or restricted   Harrel Carina, MS, OTR/L   Harrel Carina, MS, OTR/L 11/25/2016, 3:27 PM

## 2016-11-25 NOTE — Progress Notes (Signed)
Called report to Victorino Dike, RN at [facility], going to rm [phone]. Answered all questions. EMS called for transport.

## 2016-11-25 NOTE — Clinical Social Work Note (Signed)
Pt is ready for discharge today and will return to [facility]. Pt and daughter are aware and agreeable to discharge plan. Facility is ready to accept pt as they have received discharge information. RN will call report. [facility] will provide transportation. CSW is signing off as no further needs identified.   Darden Dates, MSW, LCSW Clinical Social Worker  [phone]

## 2016-11-25 NOTE — Evaluation (Signed)
Physical Therapy Evaluation Patient Details Name: Orlando Thalmann MRN: [id number] DOB: 16-Sep-1919 Today's Date: 11/25/2016   History of Present Illness  Pt is a 83 yo F with L hip prosthesis posterior dislocation on 11/23/2016 after sitting with seat up on the toilet. Pt underwent L hip reduction yesterday (11/24/2016). Pt also has history of dislocation ~1 mo prior to admission. PMH includes: L hemiarthroplasty, asthma, HLD, osteoporosis, HTN, and CKD.  Clinical Impression  Prior to admission pt was completing rehab and anticipating discharge back to [facility] ALF. Pt requires min assist of the trunk for supine to sit. Pt also requires min assist for sit to stand to RW and CGA for short ambulation distances with RW to bedside commode and chair. L hip pain reported 3/10 pain at max during today's session. Pt able to recall 3/3 hip precautions but required verbal cueing throughout session to cognitively maintain hip precautions (most notably keeping LE extension when going from sit to stand and preventing internal rotation when turning).    Follow Up Recommendations SNF    Equipment Recommendations  Rolling walker with 5" wheels    Recommendations for Other Services       Precautions / Restrictions Precautions Precautions: Posterior Hip;Fall Precaution Booklet Issued: Yes (comment) Required Braces or Orthoses: Other Brace/Splint Other Brace/Splint: Hip abduction brace at all times Restrictions Weight Bearing Restrictions: Yes LLE Weight Bearing: Weight bearing as tolerated      Mobility  Bed Mobility Overal bed mobility: Needs Assistance Bed Mobility: Supine to Sit     Supine to sit: Min assist     General bed mobility comments: Min assist required from supine to sit for trunk assistance, increased time and effort to perform task, verbal cueing to maintain hip precautions  Transfers Overall transfer level: Needs assistance Equipment used: Rolling walker (2 wheeled) Transfers:  Sit to/from Stand Sit to Stand: Min assist         General transfer comment: Pt requires min assist for sit to stand and verbal cueing to extend L LE out forward prior to sitting or standing to help maintain hip precautions  Ambulation/Gait Ambulation/Gait assistance: Min guard Ambulation Distance (Feet):  (5 ft EOB to bedside commode, 5 ft BSC to chair) Assistive device: Rolling walker (2 wheeled) Gait Pattern/deviations: Decreased stride length;Antalgic Gait velocity:  (decreased gait velocity)   General Gait Details: Pt required verbal cueing for maintaining hip precautions when making turns, pt noted having decreased stride length and antalgic gait but did well off setting weight through B UE for relief of L LE.  Stairs            Wheelchair Mobility    Modified Rankin (Stroke Patients Only)       Balance Overall balance assessment: Needs assistance Sitting-balance support: Bilateral upper extremity supported;Feet supported Sitting balance-Leahy Scale: Good Sitting balance - Comments: CGA required due to altered center of gravity while maintaining hip precautions   Standing balance support:  (supported on RW) Standing balance-Leahy Scale: Fair Standing balance comment: Requires CGA and UE support from RW to maintain static balance, secondary to L LE pain and decreased strength                             Pertinent Vitals/Pain Pain Assessment: 0-10 Pain Score: 3  Pain Location: L hip  Pain Descriptors / Indicators: Aching;Sore Pain Intervention(s): Limited activity within patient's tolerance;Monitored during session;Premedicated before session;Repositioned  HR 105-112 bpm with activity  O2 WFL on room air during session    Home Living Family/patient expects to be discharged to:: [place]: [place] Rile - 2 wheels;Cane - single point Additional Comments: Pt lives at ALF    Prior Function Level of  Independence: Needs assistance   Gait / Transfers Assistance Needed: Pt was at STR ambulating with RW vs SPC depending on activity; pt reports no recent need for assistance with bed mobility  ADL's / Homemaking Assistance Needed: Pt reports requiring min assist for dressing and bathing        Hand Dominance        Extremity/Trunk Assessment   Upper Extremity Assessment Upper Extremity Assessment: Defer to OT evaluation (Pt reports history of R rotator cuff issues)    Lower Extremity Assessment Lower Extremity Assessment: LLE deficits/detail (R LE strength and ROM WFL) LLE Deficits / Details: L hip flexion at least 3/5 AROM (limited due to L thigh pain), L knee extension at least 3/5, L ankle DF/PF at least 3/5, L hip abduction/adduction (to neutral) at least 3/5    Cervical / Trunk Assessment Cervical / Trunk Assessment: Normal  Communication   Communication: No difficulties  Cognition Arousal/Alertness: Awake/alert Behavior During Therapy: WFL for tasks assessed/performed Overall Cognitive Status: Within Functional Limits for tasks assessed                                 General Comments: Pt able to state 3/3 posterior hip precautions      General Comments      Exercises Total Joint Exercises Ankle Circles/Pumps: AROM;Strengthening;Both;10 reps;Supine (HOB elevated for pt comfort) Quad Sets: AROM;Strengthening;Left;10 reps;Supine (HOB elevated for pt comfort) Gluteal Sets: AROM;Strengthening;Left;10 reps;Supine (HOB elevated for pt comfort) Short Arc Quad: AROM;Strengthening;Left;10 reps;Supine (HOB elevated for pt comfort) Heel Slides: AAROM;Strengthening;Left;10 reps;Supine (HOB elevated for pt comfort, AAROM performed due to increase in L thigh pain, otherwise pt able to perform AROM heel slides) Hip ABduction/ADduction: AROM;Strengthening;Left;10 reps;Supine (HOB elevated for pt comfort, ADduction to neutral)   Assessment/Plan    PT Assessment  Patient needs continued PT services  PT Problem List Decreased strength;Decreased range of motion;Decreased activity tolerance;Decreased balance;Decreased knowledge of precautions;Pain;Decreased mobility;Decreased knowledge of use of DME       PT Treatment Interventions DME instruction;Gait training;Functional mobility training;Therapeutic activities;Therapeutic exercise;Balance training;Patient/family education    PT Goals (Current goals can be found in the Care Plan section)  Acute Rehab PT Goals Patient Stated Goal: to go back to [facility] PT Goal Formulation: With patient Time For Goal Achievement: 12/09/16 Potential to Achieve Goals: Good    Frequency 7X/week   Barriers to discharge Decreased caregiver support Level of assist at ALF    Co-evaluation               AM-PAC PT "6 Clicks" Daily Activity  Outcome Measure Difficulty turning over in bed (including adjusting bedclothes, sheets and blankets)?: A Little Difficulty moving from lying on back to sitting on the side of the bed? : Total Difficulty sitting down on and standing up from a chair with arms (e.g., wheelchair, bedside commode, etc,.)?: Total Help needed moving to and from a bed to chair (including a wheelchair)?: A Little Help needed walking in hospital room?: A Little Help needed climbing 3-5 steps with a railing? : A Lot 6 Click Score: 13    End  of Session Equipment Utilized During Treatment: Gait belt;Other (comment) (hip abduction brace) Activity Tolerance: Patient tolerated treatment well Patient left: in chair;with call bell/phone within reach;with chair alarm set;with SCD's reapplied (B heels elevated via towel rolls) Nurse Communication: Mobility status;Precautions;Weight bearing status (on whiteboard) PT Visit Diagnosis: History of falling (Z91.81);Other abnormalities of gait and mobility (R26.89);Pain Pain - Right/Left: Left Pain - part of body: Hip    Time: 0922-1012 PT Time Calculation  (min) (ACUTE ONLY): 50 min   Charges:         PT G CodesLaqueta Carina, SPT 12-02-2016,12:30 PM [phone]

## 2016-11-25 NOTE — Discharge Summary (Signed)
Physician Discharge Summary  Patient ID: Jacqueline Sanchez MRN: [id number] DOB/AGE: [birth year] 81 y.o.  Admit date: 11/23/2016 Discharge date: 11/25/2016  Admission Diagnoses:  Discharge Diagnoses:  Active Problems:   Recurrent posterior dislocation of hip, left   Discharged Condition: good  Hospital Course: Dislocted left hemiarthroplasty was reduced by Dr Mack Guise in surgery.  Dr Rudene Christians saw patient and recommeded total hip revision by anterior approach next week which patient wishes to proceed with.    Consults: orthopedic surgery  Significant Diagnostic Studies: radiology: X-Ray: hip reduced  Treatments: TPN and therapies: PT  Discharge Exam: Blood pressure (!) 135/45, pulse 83, temperature 98.4 F (36.9 C), temperature source Oral, resp. rate 19, height 5\' 4"  (1.626 m), weight 58.2 kg (128 lb 3.2 oz), SpO2 97 %. hip stable.  pain decreased  Disposition: 03-Skilled Nursing Facility  Discharge Instructions    Call MD / Call 911    Complete by:  As directed    If you experience chest pain or shortness of breath, CALL 911 and be transported to the hospital emergency room.  If you develope a fever above 101 F, pus (white drainage) or increased drainage or redness at the wound, or calf pain, call your surgeon's office.   Call MD for:  persistant nausea and vomiting    Complete by:  As directed    Call MD for:  redness, tenderness, or signs of infection (pain, swelling, redness, odor or green/yellow discharge around incision site)    Complete by:  As directed    Call MD for:  severe uncontrolled pain    Complete by:  As directed    Call MD for:  temperature >100.4    Complete by:  As directed    Constipation Prevention    Complete by:  As directed    Drink plenty of fluids.  Prune juice may be helpful.  You may use a stool softener, such as Colace (over the counter) 100 mg twice a day.  Use MiraLax (over the counter) for constipation as needed.   Diet - low sodium heart healthy     Complete by:  As directed    Diet - low sodium heart healthy    Complete by:  As directed    Discharge instructions    Complete by:  As directed    Patient must wear her hip abduction brace at all times.   She must observe posterior hip precautions.  Patient and her family are considering revision hip surgery to convert her hemiarthroplasty to a total hip arthroplasty in an effort to give her reliable stability. Patient will follow up with Dr. Rudene Christians and coordinate with his office to set up surgery.   Discharge instructions    Complete by:  As directed    Wear brace at all times WBAT with walker Dr Rudene Christians to do surgery next week   Driving restrictions    Complete by:  As directed    No driving for a minimum of 6 weeks or after her revision surgery.   Follow the hip precautions as taught in Physical Therapy    Complete by:  As directed    Increase activity slowly    Complete by:  As directed    Increase activity slowly as tolerated    Complete by:  As directed    TED hose    Complete by:  As directed    Use stockings (TED hose) for 2 weeks on both leg(s).  You may remove them at night for  sleeping.     Allergies as of 11/25/2016      Reactions   Morphine And Related Nausea Only      Medication List    STOP taking these medications   enoxaparin 30 MG/0.3ML injection Commonly known as:  LOVENOX     TAKE these medications   acetaminophen 325 MG tablet Commonly known as:  TYLENOL Take 2 tablets (650 mg total) by mouth every 6 (six) hours as needed for mild pain.   amLODipine 10 MG tablet Commonly known as:  NORVASC Take 1 tablet (10 mg total) by mouth daily.   aspirin EC 81 MG tablet Take 81 mg by mouth every other day.   atorvastatin 80 MG tablet Commonly known as:  LIPITOR Take 1 tablet by mouth daily.   ferrous sulfate 325 (65 FE) MG tablet Take 325 mg by mouth daily with breakfast.   hydrALAZINE 25 MG tablet Commonly known as:  APRESOLINE Take 1 tablet (25 mg  total) by mouth 4 (four) times daily.   losartan 50 MG tablet Commonly known as:  COZAAR Take 1 tablet by mouth daily.   Melatonin 3 MG Tabs Take 1 tablet by mouth at bedtime.   metoprolol succinate 50 MG 24 hr tablet Commonly known as:  TOPROL-XL Take 1 tablet (50 mg total) by mouth daily. Take with or immediately following a meal.   MULTI-VITAMINS Tabs Take 1 tablet by mouth daily.   oxyCODONE-acetaminophen 5-325 MG tablet Commonly known as:  PERCOCET/ROXICET Take 1 tablet by mouth every 6 (six) hours as needed for severe pain.   polyethylene glycol packet Commonly known as:  MIRALAX / GLYCOLAX Take 17 g by mouth daily.   senna 8.6 MG tablet Commonly known as:  SENOKOT Take 2 tablets by mouth 2 (two) times daily.   traMADol 50 MG tablet Commonly known as:  ULTRAM Take 1 tablet (50 mg total) by mouth every 6 (six) hours as needed for moderate pain.   vitamin B-12 1000 MCG tablet Commonly known as:  CYANOCOBALAMIN Take 1,000 mcg by mouth daily.   Vitamin D (Cholecalciferol) 1000 units Caps Take 1 capsule by mouth daily.      Contact information for after-discharge care    Destination    [facility] .   Specialty:  [place] information: [place] [address] [place] [phone]              Signed: Park Breed 11/25/2016, 1:06 PM

## 2016-11-25 NOTE — Progress Notes (Signed)
Subjective: 1 Day Post-Op Procedure(s) (LRB): CLOSED REDUCTION HIP (Left)    Patient reports pain as none. Discussed anterior total hip with Dr Rudene Christians next week which seems appropriate.  Will d/c to SNF today and will return next week for surgery  Objective: oob in chair.  Comfortable.  csm good.  Hip stable  VITALS:   Vitals:   11/25/16 0439 11/25/16 0743  BP: (!) 147/51 (!) 135/45  Pulse: 82 83  Resp: 19   Temp: 98.4 F (36.9 C) 98.4 F (36.9 C)    Neurologically intact ABD soft Neurovascular intact Sensation intact distally Intact pulses distally Dorsiflexion/Plantar flexion intact  LABS  Recent Labs  11/23/16 1824  HGB 13.1  HCT 37.1  WBC 9.6  PLT 373     Recent Labs  11/23/16 1824  NA 136  K 4.5  BUN 24*  CREATININE 1.14*  GLUCOSE 112*     Recent Labs  11/23/16 1824  INR 0.85     Assessment/Plan: 1 Day Post-Op Procedure(s) (LRB): CLOSED REDUCTION HIP (Left)   Up with therapy Discharge to SNF  Continue brace all times

## 2016-11-25 NOTE — Care Management Important Message (Signed)
Important Message  Patient Details  Name: Jacqueline Sanchez MRN: [id number] Date of Birth: [birth year]   Medicare Important Message Given:  N/A - LOS <3 / Initial given by admissions    Jolly Mango, RN 11/25/2016, 12:21 PM

## 2016-11-28 DIAGNOSIS — S73001A Unspecified subluxation of right hip, initial encounter: Secondary | ICD-10-CM | POA: Diagnosis not present

## 2016-11-29 ENCOUNTER — Encounter
Admission: RE | Admit: 2016-11-29 | Discharge: 2016-11-29 | Disposition: A | Discharge status: Home / Self Care | Payer: Medicare Other | Source: Ambulatory Visit | Attending: Orthopedic Surgery | Admitting: Orthopedic Surgery

## 2016-11-29 ENCOUNTER — Other Ambulatory Visit: Payer: Medicare Other

## 2016-11-29 NOTE — Op Note (Signed)
11/24/2016  PATIENT:  [name]    PRE-OPERATIVE DIAGNOSIS:  Recurrent left hip hemiarthroplasty dislocation   POST-OPERATIVE DIAGNOSIS:  Same  PROCEDURE:  CLOSED REDUCTION LEFT HIP HEMIARTHROPLASTY PROSTHESIS  SURGEON:  Thornton Park, MD  ANESTHESIA:   General  PREOPERATIVE INDICATIONS:  Jacqueline Sanchez is a  83 y.o. female with a diagnosis of recurrent left hemiarthroplasty dislocation.  I have recommended a closed reduction under anesthesia with a plan for later revision hip arthroplasty.    I discussed the risks and benefits of surgery. The risks include but are not limited to infection, bleeding requiring blood transfusion, nerve or blood vessel injury, joint stiffness or loss of motion, persistent pain, weakness or instability, malunion, nonunion and hardware failure and the need for further surgery. Medical risks include but are not limited to DVT and pulmonary embolism, myocardial infarction, stroke, pneumonia, respiratory failure and death. Patient and her family understood these risks and wished to proceed.   OPERATIVE FINDINGS: Superior posterior dislocation of left hip hemiarthroplasty prosthesis  OPERATIVE PROCEDURE: Patient was met in the preoperative area.  The left hip was marked according the hospital's correct site of surgery protocol. Patient is brought to the operating room. She was placed on the operative table in the supine position. She received sedation with propofol. Patient had a closed reduction performed with hip flexion and longitudinal traction with gentle internal and external rotation. C-arm images were used to confirm the patient hemiarthroplasty prosthesis was reduced. The hip was then ranged under fluoroscopy and was found to be extremely stable. Patient was placed back in her hip abduction brace and brought to the PACU in stable condition.

## 2016-11-29 NOTE — Patient Instructions (Addendum)
  Your procedure is scheduled on: 12-01-16 THURSDAY Report to Same Day Surgery 2nd floor medical mall [facility] Entrance-take elevator on left to 2nd floor.  Check in with surgery information desk.) To find out your arrival time please call [phone] between 1PM - 3PM on 11-30-16 [facility]  Remember: Instructions that are not followed completely may result in serious medical risk, up to and including death, or upon the discretion of your surgeon and anesthesiologist your surgery may need to be rescheduled.    _x___ 1. Do not eat food or drink liquids after midnight. No gum chewing or hard candies.     __x__ 2. No Alcohol for 24 hours before or after surgery.   __x__3. No Smoking for 24 prior to surgery.   ____  4. Bring all medications with you on the day of surgery if instructed.    __x__ 5. Notify your doctor if there is any change in your medical condition     (cold, fever, infections).     Do not wear jewelry, make-up, hairpins, clips or nail polish.  Do not wear lotions, powders, or perfumes. You may wear deodorant.  Do not shave 48 hours prior to surgery. Men may shave face and neck.  Do not bring valuables to the hospital.    [facility] is not responsible for any belongings or valuables.               Contacts, dentures or bridgework may not be worn into surgery.  Leave your suitcase in the car. After surgery it may be brought to your room.  For patients admitted to the hospital, discharge time is determined by your treatment team.   Patients discharged the day of surgery will not be allowed to drive home.  You will need someone to drive you home and stay with you the night of your procedure.    Please read over the following fact sheets that you were given:     _x___ [place] WITH A SMALL SIP OF WATER. These include:  1. AMLODIPINE  2. HYDRALAZINE  3.METOPROLOL  4.MAY TAKE PERCOCET AM OF SURGERY IF  NEEDED  5.  6.  ____Fleets enema or Magnesium Citrate as directed.   ____ Use CHG Soap or sage wipes as directed on instruction sheet   ____ Use inhalers on the day of surgery and bring to hospital day of surgery  ____ Stop Metformin and Janumet 2 days prior to surgery.    ____ Take 1/2 of usual insulin dose the night before surgery and none on the morning surgery.   _x___ Follow recommendations from Cardiologist, Pulmonologist or PCP regarding stopping Aspirin, Coumadin, Pllavix ,Eliquis, Effient, or Pradaxa, and Pletal-STOP ASPIRIN NOW  X____Stop Anti-inflammatories such as Advil, Aleve, Ibuprofen, Motrin, Naproxen, Naprosyn, Goodies powders or aspirin products. OK to take Tylenol, PERCOCET OR ULTRAM   _X___ Stop supplements until after surgery-STOP MELATONIN NOW   ____ Bring C-Pap to the hospital.

## 2016-11-30 MED ORDER — CEFAZOLIN SODIUM-DEXTROSE 2-4 GM/100ML-% IV SOLN
2.0000 g | Freq: Once | INTRAVENOUS | Status: AC
  Administered 2016-12-01: 2 g via INTRAVENOUS

## 2016-12-01 ENCOUNTER — Inpatient Hospital Stay: Payer: Medicare Other | Admitting: Anesthesiology

## 2016-12-01 ENCOUNTER — Inpatient Hospital Stay: Admit: 2016-12-01 | Payer: Medicare Other | Admitting: Orthopedic Surgery

## 2016-12-01 ENCOUNTER — Encounter: Payer: Self-pay | Admitting: *Deleted

## 2016-12-01 ENCOUNTER — Inpatient Hospital Stay
Admission: RE | Admit: 2016-12-01 | Discharge: 2016-12-03 | DRG: 468 | Disposition: A | Discharge status: Skilled Nursing Facility | Payer: Medicare Other | Source: Ambulatory Visit | Attending: Orthopedic Surgery | Admitting: Orthopedic Surgery

## 2016-12-01 ENCOUNTER — Inpatient Hospital Stay: Payer: Medicare Other

## 2016-12-01 ENCOUNTER — Encounter
Admission: RE | Disposition: A | Discharge status: Skilled Nursing Facility | Payer: Self-pay | Source: Ambulatory Visit | Attending: Orthopedic Surgery

## 2016-12-01 DIAGNOSIS — Z419 Encounter for procedure for purposes other than remedying health state, unspecified: Secondary | ICD-10-CM

## 2016-12-01 DIAGNOSIS — Z79899 Other long term (current) drug therapy: Secondary | ICD-10-CM

## 2016-12-01 DIAGNOSIS — K219 Gastro-esophageal reflux disease without esophagitis: Secondary | ICD-10-CM | POA: Diagnosis not present

## 2016-12-01 DIAGNOSIS — M810 Age-related osteoporosis without current pathological fracture: Secondary | ICD-10-CM | POA: Diagnosis present

## 2016-12-01 DIAGNOSIS — Z471 Aftercare following joint replacement surgery: Secondary | ICD-10-CM | POA: Diagnosis not present

## 2016-12-01 DIAGNOSIS — N183 Chronic kidney disease, stage 3 (moderate): Secondary | ICD-10-CM | POA: Diagnosis present

## 2016-12-01 DIAGNOSIS — M25559 Pain in unspecified hip: Secondary | ICD-10-CM | POA: Diagnosis not present

## 2016-12-01 DIAGNOSIS — S73036D Other anterior dislocation of unspecified hip, subsequent encounter: Secondary | ICD-10-CM | POA: Diagnosis not present

## 2016-12-01 DIAGNOSIS — R269 Unspecified abnormalities of gait and mobility: Secondary | ICD-10-CM | POA: Diagnosis not present

## 2016-12-01 DIAGNOSIS — Y792 Prosthetic and other implants, materials and accessory orthopedic devices associated with adverse incidents: Secondary | ICD-10-CM | POA: Diagnosis present

## 2016-12-01 DIAGNOSIS — I10 Essential (primary) hypertension: Secondary | ICD-10-CM | POA: Diagnosis not present

## 2016-12-01 DIAGNOSIS — I69320 Aphasia following cerebral infarction: Secondary | ICD-10-CM

## 2016-12-01 DIAGNOSIS — Z96642 Presence of left artificial hip joint: Secondary | ICD-10-CM | POA: Diagnosis not present

## 2016-12-01 DIAGNOSIS — M24452 Recurrent dislocation, left hip: Secondary | ICD-10-CM | POA: Diagnosis present

## 2016-12-01 DIAGNOSIS — Z885 Allergy status to narcotic agent status: Secondary | ICD-10-CM

## 2016-12-01 DIAGNOSIS — T84021A Dislocation of internal left hip prosthesis, initial encounter: Principal | ICD-10-CM | POA: Diagnosis present

## 2016-12-01 DIAGNOSIS — J45909 Unspecified asthma, uncomplicated: Secondary | ICD-10-CM | POA: Diagnosis present

## 2016-12-01 DIAGNOSIS — Z7982 Long term (current) use of aspirin: Secondary | ICD-10-CM

## 2016-12-01 DIAGNOSIS — I129 Hypertensive chronic kidney disease with stage 1 through stage 4 chronic kidney disease, or unspecified chronic kidney disease: Secondary | ICD-10-CM | POA: Diagnosis present

## 2016-12-01 DIAGNOSIS — E785 Hyperlipidemia, unspecified: Secondary | ICD-10-CM | POA: Diagnosis present

## 2016-12-01 DIAGNOSIS — M159 Polyosteoarthritis, unspecified: Secondary | ICD-10-CM | POA: Diagnosis present

## 2016-12-01 DIAGNOSIS — G8918 Other acute postprocedural pain: Secondary | ICD-10-CM

## 2016-12-01 DIAGNOSIS — M25352 Other instability, left hip: Secondary | ICD-10-CM | POA: Diagnosis not present

## 2016-12-01 DIAGNOSIS — M6281 Muscle weakness (generalized): Secondary | ICD-10-CM | POA: Diagnosis not present

## 2016-12-01 DIAGNOSIS — Z7401 Bed confinement status: Secondary | ICD-10-CM | POA: Diagnosis not present

## 2016-12-01 HISTORY — PX: ANTERIOR HIP REVISION: SHX6527

## 2016-12-01 LAB — CBC
HEMATOCRIT: 36.9 % (ref 35.0–47.0)
HEMOGLOBIN: 12.2 g/dL (ref 12.0–16.0)
MCH: 32.8 pg (ref 26.0–34.0)
MCHC: 33 g/dL (ref 32.0–36.0)
MCV: 99.3 fL (ref 80.0–100.0)
PLATELETS: 324 10*3/uL (ref 150–440)
RBC: 3.71 MIL/uL — AB (ref 3.80–5.20)
RDW: 14.3 % (ref 11.5–14.5)
WBC: 16.2 10*3/uL — AB (ref 3.6–11.0)

## 2016-12-01 LAB — CREATININE, SERUM
Creatinine, Ser: 1.14 mg/dL — ABNORMAL HIGH (ref 0.44–1.00)
GFR calc non Af Amer: 40 mL/min — ABNORMAL LOW (ref >60)
GFR, EST AFRICAN AMERICAN: 46 mL/min — AB (ref >60)

## 2016-12-01 LAB — SEDIMENTATION RATE: Sed Rate: 30 mm/hr (ref 0–30)

## 2016-12-01 SURGERY — REVISION, TOTAL ARTHROPLASTY, HIP, ANTERIOR APPROACH
Anesthesia: Spinal | Site: Hip | Laterality: Left | Wound class: Clean

## 2016-12-01 MED ORDER — PROPOFOL 10 MG/ML IV BOLUS
INTRAVENOUS | Status: AC
  Filled 2016-12-01: qty 20

## 2016-12-01 MED ORDER — AMLODIPINE BESYLATE 10 MG PO TABS
10.0000 mg | ORAL_TABLET | ORAL | Status: DC
  Administered 2016-12-02 – 2016-12-03 (×2): 10 mg via ORAL
  Filled 2016-12-01 (×2): qty 1

## 2016-12-01 MED ORDER — BUPIVACAINE HCL (PF) 0.5 % IJ SOLN
INTRAMUSCULAR | Status: AC
  Filled 2016-12-01: qty 10

## 2016-12-01 MED ORDER — DIPHENHYDRAMINE HCL 12.5 MG/5ML PO ELIX: 12.5000 mg | ORAL_SOLUTION | ORAL | Status: DC | PRN

## 2016-12-01 MED ORDER — METHOCARBAMOL 500 MG PO TABS
500.0000 mg | ORAL_TABLET | Freq: Four times a day (QID) | ORAL | Status: DC | PRN
  Administered 2016-12-01: 500 mg via ORAL
  Filled 2016-12-01: qty 1

## 2016-12-01 MED ORDER — SODIUM CHLORIDE 0.9 % IV SOLN
INTRAVENOUS | Status: DC
  Administered 2016-12-01 – 2016-12-02 (×2): via INTRAVENOUS

## 2016-12-01 MED ORDER — OXYCODONE HCL 5 MG PO TABS
5.0000 mg | ORAL_TABLET | ORAL | Status: DC | PRN
  Administered 2016-12-01 – 2016-12-03 (×6): 10 mg via ORAL
  Administered 2016-12-03: 5 mg via ORAL
  Filled 2016-12-01: qty 1
  Filled 2016-12-01: qty 2
  Filled 2016-12-01: qty 1
  Filled 2016-12-01 (×5): qty 2

## 2016-12-01 MED ORDER — ONDANSETRON HCL 4 MG/2ML IJ SOLN: 4.0000 mg | Freq: Once | INTRAMUSCULAR | Status: DC | PRN

## 2016-12-01 MED ORDER — FAMOTIDINE 20 MG PO TABS: 20.0000 mg | ORAL_TABLET | Freq: Once | ORAL | Status: DC

## 2016-12-01 MED ORDER — VITAMIN D 1000 UNITS PO TABS
1000.0000 [IU] | ORAL_TABLET | Freq: Every day | ORAL | Status: DC
  Administered 2016-12-01 – 2016-12-03 (×3): 1000 [IU] via ORAL
  Filled 2016-12-01 (×3): qty 1

## 2016-12-01 MED ORDER — ZOLPIDEM TARTRATE 5 MG PO TABS: 5.0000 mg | ORAL_TABLET | Freq: Every evening | ORAL | Status: DC | PRN

## 2016-12-01 MED ORDER — TETRACAINE HCL 1 % IJ SOLN
INTRAMUSCULAR | Status: DC | PRN
  Administered 2016-12-01: 6 mg via INTRASPINAL

## 2016-12-01 MED ORDER — NEOMYCIN-POLYMYXIN B GU 40-200000 IR SOLN
Status: DC | PRN
Start: 2016-12-01 — End: 2016-12-01
  Administered 2016-12-01: 4 mL

## 2016-12-01 MED ORDER — SODIUM CHLORIDE 0.9 % IV SOLN
INTRAVENOUS | Status: DC | PRN
  Administered 2016-12-01: 30 ug/min via INTRAVENOUS

## 2016-12-01 MED ORDER — METOPROLOL SUCCINATE ER 50 MG PO TB24
50.0000 mg | ORAL_TABLET | Freq: Every day | ORAL | Status: DC
  Administered 2016-12-02 – 2016-12-03 (×2): 50 mg via ORAL
  Filled 2016-12-01 (×2): qty 1

## 2016-12-01 MED ORDER — SENNA 8.6 MG PO TABS
2.0000 | ORAL_TABLET | Freq: Two times a day (BID) | ORAL | Status: DC
  Administered 2016-12-01 – 2016-12-03 (×4): 17.2 mg via ORAL
  Filled 2016-12-01 (×6): qty 2

## 2016-12-01 MED ORDER — FENTANYL CITRATE (PF) 100 MCG/2ML IJ SOLN: 25.0000 ug | INTRAMUSCULAR | Status: DC | PRN

## 2016-12-01 MED ORDER — BUPIVACAINE-EPINEPHRINE 0.25% -1:200000 IJ SOLN
INTRAMUSCULAR | Status: DC | PRN
  Administered 2016-12-01: 30 mL

## 2016-12-01 MED ORDER — KETOROLAC TROMETHAMINE 30 MG/ML IJ SOLN
INTRAMUSCULAR | Status: AC
  Filled 2016-12-01: qty 1

## 2016-12-01 MED ORDER — MAGNESIUM CITRATE PO SOLN
1.0000 | Freq: Once | ORAL | Status: DC | PRN
  Filled 2016-12-01: qty 296

## 2016-12-01 MED ORDER — MULTI-VITAMINS PO TABS: 1.0000 | ORAL_TABLET | Freq: Every day | ORAL | Status: DC

## 2016-12-01 MED ORDER — LOSARTAN POTASSIUM 50 MG PO TABS
50.0000 mg | ORAL_TABLET | Freq: Every evening | ORAL | Status: DC
  Administered 2016-12-01 – 2016-12-02 (×2): 50 mg via ORAL
  Filled 2016-12-01 (×2): qty 1

## 2016-12-01 MED ORDER — ONDANSETRON HCL 4 MG PO TABS: 4.0000 mg | ORAL_TABLET | Freq: Four times a day (QID) | ORAL | Status: DC | PRN

## 2016-12-01 MED ORDER — HYDRALAZINE HCL 25 MG PO TABS
25.0000 mg | ORAL_TABLET | Freq: Four times a day (QID) | ORAL | Status: DC
  Administered 2016-12-01 – 2016-12-03 (×7): 25 mg via ORAL
  Filled 2016-12-01 (×7): qty 1

## 2016-12-01 MED ORDER — VITAMIN D (CHOLECALCIFEROL) 25 MCG (1000 UT) PO CAPS: 1.0000 | ORAL_CAPSULE | Freq: Every day | ORAL | Status: DC

## 2016-12-01 MED ORDER — MELATONIN 3 MG PO TABS: 1.0000 | ORAL_TABLET | Freq: Every day | ORAL | Status: DC

## 2016-12-01 MED ORDER — MENTHOL 3 MG MT LOZG
1.0000 | LOZENGE | OROMUCOSAL | Status: DC | PRN
  Filled 2016-12-01: qty 9

## 2016-12-01 MED ORDER — PROPOFOL 500 MG/50ML IV EMUL
INTRAVENOUS | Status: DC | PRN
  Administered 2016-12-01: 25 ug/kg/min via INTRAVENOUS

## 2016-12-01 MED ORDER — KETAMINE HCL 10 MG/ML IJ SOLN
INTRAMUSCULAR | Status: DC | PRN
  Administered 2016-12-01: 25 mg via INTRAVENOUS

## 2016-12-01 MED ORDER — PROPOFOL 10 MG/ML IV BOLUS
INTRAVENOUS | Status: DC | PRN
  Administered 2016-12-01: 20 mg via INTRAVENOUS

## 2016-12-01 MED ORDER — LIDOCAINE HCL (PF) 2 % IJ SOLN
INTRAMUSCULAR | Status: AC
  Filled 2016-12-01: qty 2

## 2016-12-01 MED ORDER — POLYETHYLENE GLYCOL 3350 17 G PO PACK
17.0000 g | PACK | Freq: Every day | ORAL | Status: DC
  Administered 2016-12-01 – 2016-12-03 (×3): 17 g via ORAL
  Filled 2016-12-01 (×3): qty 1

## 2016-12-01 MED ORDER — TRANEXAMIC ACID 1000 MG/10ML IV SOLN
1000.0000 mg | INTRAVENOUS | Status: AC
  Administered 2016-12-01: 1000 mg via INTRAVENOUS
  Filled 2016-12-01: qty 10

## 2016-12-01 MED ORDER — METOCLOPRAMIDE HCL 5 MG/ML IJ SOLN: 5.0000 mg | Freq: Three times a day (TID) | INTRAMUSCULAR | Status: DC | PRN

## 2016-12-01 MED ORDER — BISACODYL 10 MG RE SUPP
10.0000 mg | Freq: Every day | RECTAL | Status: DC | PRN
  Administered 2016-12-03: 10 mg via RECTAL
  Filled 2016-12-01: qty 1

## 2016-12-01 MED ORDER — ATORVASTATIN CALCIUM 80 MG PO TABS
80.0000 mg | ORAL_TABLET | Freq: Every evening | ORAL | Status: DC
  Administered 2016-12-01 – 2016-12-02 (×2): 80 mg via ORAL
  Filled 2016-12-01: qty 1
  Filled 2016-12-01: qty 4
  Filled 2016-12-01: qty 1
  Filled 2016-12-01: qty 4

## 2016-12-01 MED ORDER — ACETAMINOPHEN 650 MG RE SUPP: 650.0000 mg | Freq: Four times a day (QID) | RECTAL | Status: DC | PRN

## 2016-12-01 MED ORDER — BUPIVACAINE HCL (PF) 0.5 % IJ SOLN
INTRAMUSCULAR | Status: DC | PRN
  Administered 2016-12-01: 2 mL via INTRATHECAL

## 2016-12-01 MED ORDER — METHOCARBAMOL 1000 MG/10ML IJ SOLN
500.0000 mg | Freq: Four times a day (QID) | INTRAVENOUS | Status: DC | PRN
  Filled 2016-12-01: qty 5

## 2016-12-01 MED ORDER — VITAMIN B-12 1000 MCG PO TABS
1000.0000 ug | ORAL_TABLET | Freq: Every day | ORAL | Status: DC
  Administered 2016-12-02 – 2016-12-03 (×2): 1000 ug via ORAL
  Filled 2016-12-01 (×2): qty 1

## 2016-12-01 MED ORDER — ONDANSETRON HCL 4 MG/2ML IJ SOLN: 4.0000 mg | Freq: Four times a day (QID) | INTRAMUSCULAR | Status: DC | PRN

## 2016-12-01 MED ORDER — METOCLOPRAMIDE HCL 10 MG PO TABS: 5.0000 mg | ORAL_TABLET | Freq: Three times a day (TID) | ORAL | Status: DC | PRN

## 2016-12-01 MED ORDER — FERROUS SULFATE 325 (65 FE) MG PO TABS
325.0000 mg | ORAL_TABLET | Freq: Every day | ORAL | Status: DC
  Administered 2016-12-02 – 2016-12-03 (×2): 325 mg via ORAL
  Filled 2016-12-01 (×2): qty 1

## 2016-12-01 MED ORDER — HYDROMORPHONE HCL 1 MG/ML IJ SOLN: 0.5000 mg | INTRAMUSCULAR | Status: DC | PRN

## 2016-12-01 MED ORDER — ENOXAPARIN SODIUM 30 MG/0.3ML ~~LOC~~ SOLN
30.0000 mg | SUBCUTANEOUS | Status: DC
  Administered 2016-12-02 – 2016-12-03 (×2): 30 mg via SUBCUTANEOUS
  Filled 2016-12-01 (×2): qty 0.3

## 2016-12-01 MED ORDER — EPHEDRINE SULFATE 50 MG/ML IJ SOLN
INTRAMUSCULAR | Status: DC | PRN
  Administered 2016-12-01: 10 mg via INTRAVENOUS

## 2016-12-01 MED ORDER — TETRACAINE HCL 1 % IJ SOLN
INTRAMUSCULAR | Status: AC
  Filled 2016-12-01: qty 2

## 2016-12-01 MED ORDER — MAGNESIUM HYDROXIDE 400 MG/5ML PO SUSP
30.0000 mL | Freq: Every day | ORAL | Status: DC | PRN
  Administered 2016-12-02 (×2): 30 mL via ORAL
  Filled 2016-12-01 (×2): qty 30

## 2016-12-01 MED ORDER — ACETAMINOPHEN 325 MG PO TABS: 650.0000 mg | ORAL_TABLET | Freq: Four times a day (QID) | ORAL | Status: DC | PRN

## 2016-12-01 MED ORDER — ADULT MULTIVITAMIN W/MINERALS CH
1.0000 | ORAL_TABLET | Freq: Every day | ORAL | Status: DC
  Administered 2016-12-02 – 2016-12-03 (×2): 1 via ORAL
  Filled 2016-12-01 (×2): qty 1

## 2016-12-01 MED ORDER — PHENYLEPHRINE HCL 10 MG/ML IJ SOLN
INTRAMUSCULAR | Status: DC | PRN
  Administered 2016-12-01: 100 ug via INTRAVENOUS

## 2016-12-01 MED ORDER — CEFAZOLIN SODIUM-DEXTROSE 1-4 GM/50ML-% IV SOLN
1.0000 g | Freq: Four times a day (QID) | INTRAVENOUS | Status: AC
  Administered 2016-12-01 – 2016-12-02 (×3): 1 g via INTRAVENOUS
  Filled 2016-12-01 (×3): qty 50

## 2016-12-01 MED ORDER — MELATONIN 5 MG PO TABS
1.0000 | ORAL_TABLET | Freq: Every day | ORAL | Status: DC
  Administered 2016-12-01: 5 mg via ORAL
  Filled 2016-12-01 (×2): qty 1

## 2016-12-01 MED ORDER — LACTATED RINGERS IV SOLN
INTRAVENOUS | Status: DC
  Administered 2016-12-01: 10:00:00 via INTRAVENOUS

## 2016-12-01 MED ORDER — LIDOCAINE HCL (PF) 2 % IJ SOLN
INTRAMUSCULAR | Status: DC | PRN
  Administered 2016-12-01: 50 mg

## 2016-12-01 MED ORDER — PHENOL 1.4 % MT LIQD
1.0000 | OROMUCOSAL | Status: DC | PRN
  Filled 2016-12-01: qty 177

## 2016-12-01 MED ORDER — ASPIRIN EC 81 MG PO TBEC
81.0000 mg | DELAYED_RELEASE_TABLET | ORAL | Status: DC
  Administered 2016-12-03: 81 mg via ORAL
  Filled 2016-12-01 (×2): qty 1

## 2016-12-01 MED ORDER — DOCUSATE SODIUM 100 MG PO CAPS
100.0000 mg | ORAL_CAPSULE | Freq: Two times a day (BID) | ORAL | Status: DC
  Administered 2016-12-01 – 2016-12-03 (×5): 100 mg via ORAL
  Filled 2016-12-01 (×5): qty 1

## 2016-12-01 SURGICAL SUPPLY — 52 items
BLADE SAW SAG 18.5X105 (BLADE) ×3 IMPLANT
BNDG COHESIVE 6X5 TAN STRL LF (GAUZE/BANDAGES/DRESSINGS) ×9 IMPLANT
CANISTER SUCT 1200ML W/VALVE (MISCELLANEOUS) ×3 IMPLANT
CATH FOL LEG HOLDER (MISCELLANEOUS) ×3 IMPLANT
CATH TRAY METER 16FR LF (MISCELLANEOUS) ×3 IMPLANT
CHLORAPREP W/TINT 26ML (MISCELLANEOUS) ×3 IMPLANT
DRAPE C-ARM XRAY 36X54 (DRAPES) ×3 IMPLANT
DRAPE INCISE IOBAN 66X60 STRL (DRAPES) IMPLANT
DRAPE POUCH INSTRU U-SHP 10X18 (DRAPES) ×3 IMPLANT
DRAPE SHEET LG 3/4 BI-LAMINATE (DRAPES) ×9 IMPLANT
DRAPE TABLE BACK 80X90 (DRAPES) ×3 IMPLANT
DRESSING SURGICEL FIBRLLR 1X2 (HEMOSTASIS) ×2 IMPLANT
DRSG OPSITE POSTOP 4X8 (GAUZE/BANDAGES/DRESSINGS) ×6 IMPLANT
DRSG SURGICEL FIBRILLAR 1X2 (HEMOSTASIS) ×6
ELECT BLADE 6.5 EXT (BLADE) ×3 IMPLANT
ELECT REM PT RETURN 9FT ADLT (ELECTROSURGICAL) ×3
ELECTRODE REM PT RTRN 9FT ADLT (ELECTROSURGICAL) ×1 IMPLANT
FEMORAL HEAD LFIT V40 28MM P4 (Head) ×3 IMPLANT
GLOVE BIOGEL PI IND STRL 9 (GLOVE) ×1 IMPLANT
GLOVE BIOGEL PI INDICATOR 9 (GLOVE) ×2
GLOVE SURG SYN 9.0  PF PI (GLOVE) ×4
GLOVE SURG SYN 9.0 PF PI (GLOVE) ×2 IMPLANT
GOWN SRG 2XL LVL 4 RGLN SLV (GOWNS) ×1 IMPLANT
GOWN STRL NON-REIN 2XL LVL4 (GOWNS) ×2
GOWN STRL REUS W/ TWL LRG LVL3 (GOWN DISPOSABLE) ×1 IMPLANT
GOWN STRL REUS W/TWL LRG LVL3 (GOWN DISPOSABLE) ×2
HEMOVAC 400CC 10FR (MISCELLANEOUS) IMPLANT
HOOD PEEL AWAY FLYTE STAYCOOL (MISCELLANEOUS) ×3 IMPLANT
KIT PREVENA INCISION MGT 13 (CANNISTER) ×3 IMPLANT
LINER DBL MOB SZ 0 52MM (Liner) ×3 IMPLANT
MAT BLUE FLOOR 46X72 FLO (MISCELLANEOUS) ×3 IMPLANT
NDL SAFETY 18GX1.5 (NEEDLE) ×3 IMPLANT
NEEDLE SPNL 18GX3.5 QUINCKE PK (NEEDLE) ×3 IMPLANT
NS IRRIG 1000ML POUR BTL (IV SOLUTION) ×3 IMPLANT
PACK HIP COMPR (MISCELLANEOUS) ×3 IMPLANT
SHELL ACETABULAR SZ 52 DM (Shell) ×3 IMPLANT
SOL PREP PVP 2OZ (MISCELLANEOUS) ×3
SOLUTION PREP PVP 2OZ (MISCELLANEOUS) ×1 IMPLANT
SPONGE DRAIN TRACH 4X4 STRL 2S (GAUZE/BANDAGES/DRESSINGS) ×3 IMPLANT
STAPLER SKIN PROX 35W (STAPLE) ×3 IMPLANT
STRAP SAFETY BODY (MISCELLANEOUS) ×3 IMPLANT
SUT DVC 2 QUILL PDO  T11 36X36 (SUTURE) ×2
SUT DVC 2 QUILL PDO T11 36X36 (SUTURE) ×1 IMPLANT
SUT SILK 0 (SUTURE) ×2
SUT SILK 0 30XBRD TIE 6 (SUTURE) ×1 IMPLANT
SUT V-LOC 90 ABS DVC 3-0 CL (SUTURE) ×3 IMPLANT
SUT VIC AB 1 CT1 36 (SUTURE) ×3 IMPLANT
SYR 20CC LL (SYRINGE) ×3 IMPLANT
SYR 30ML LL (SYRINGE) ×3 IMPLANT
TAPE MICROFOAM 4IN (TAPE) ×3 IMPLANT
TOWEL OR 17X26 4PK STRL BLUE (TOWEL DISPOSABLE) ×3 IMPLANT
WND VAC CANISTER 500ML (MISCELLANEOUS) ×3 IMPLANT

## 2016-12-01 NOTE — Transfer of Care (Signed)
Immediate Anesthesia Transfer of Care Note  Patient: Jacqueline Sanchez  Procedure(s) Performed: Procedure(s): ANTERIOR HIP REVISION, REVISION TO TOTAL (Left)  Patient Location: PACU  Anesthesia Type:Spinal  Level of Consciousness: awake, alert  and oriented  Airway & Oxygen Therapy: Patient Spontanous Breathing  Post-op Assessment: Report given to RN and Post -op Vital signs reviewed and stable  Post vital signs: Reviewed  Last Vitals:  Vitals:   12/01/16 0908 12/01/16 1202  BP: (!) 127/42 (!) 134/42  Pulse: 75 82  Resp: 18 16  Temp: 36.8 C     Last Pain:  Vitals:   12/01/16 0908  TempSrc: Oral  PainSc: 4          Complications: No apparent anesthesia complications

## 2016-12-01 NOTE — OR Nursing (Signed)
To OR with back brace in place

## 2016-12-01 NOTE — Progress Notes (Signed)
Admission:  Patient alert and oriented. Wound vac intact on left hip with minimal drainage. Lung and heart sounds normal. No skin issues other than surgical site. Patient having full sensation and purposeful movement. Home wheelchair in room. Patient oriented to room and call bell system.   Deri Fuelling, RN

## 2016-12-01 NOTE — Anesthesia Post-op Follow-up Note (Cosign Needed)
Anesthesia QCDR form completed.        

## 2016-12-01 NOTE — Op Note (Signed)
12/01/2016  12:02 PM  PATIENT:  Jacqueline Sanchez  83 y.o. female  PRE-OPERATIVE DIAGNOSIS:  CLOSED DISLOCATION OF LEFT HIP  POST-OPERATIVE DIAGNOSIS:  CLOSED DISLOCATION OF LEFT HIP  PROCEDURE:  Procedure(s): ANTERIOR HIP REVISION, REVISION TO TOTAL (Left)  SURGEON: Laurene Footman, MD  ASSISTANTS: None  ANESTHESIA:   spinal  EBL:  Total I/O In: 600 [I.V.:600] Out: 350 [Urine:200; Blood:150]  BLOOD ADMINISTERED:none  DRAINS: none   LOCAL MEDICATIONS USED:  MARCAINE     SPECIMEN:  No Specimen  DISPOSITION OF SPECIMEN:  N/A  COUNTS:  YES  TOURNIQUET:  * No tourniquets in log *  IMPLANTS: Medacta Mpact DM cup 52 mm with liner and plus 4V 40 Stryker 28 mm metal head  DICTATION: .Dragon Dictation patient brought the operating room and after adequate spinal anesthesia was obtained the patient was placed on the operative table with the Medacta attachment. Right leg was on a well-padded table left foot in the traction boot. Preoperative x-ray was taken for templating purposes. After prepping and draping dural fashion appropriate patient identification and timeout procedure completed. Direct anterior approach was made center of the TFL muscle. The muscle fibers were retracted laterally and after going through the skin and subcutaneous tissues tissue and deep retractor placed. The deep fascia was incised and the lateral femoral circumflex vessels ligated. The anterior capsule was then exposed and a flap created with deep retractor placed. The metal head was identified and impacted with traction to separated the head with hip was then dislocated and the head removed. The stem appeared stable without excessive anteversion or retroversion. The acetabulum had some wear and reaming was carried out to 50 mm at which point a 52 mm trial fit very well. 52 mm impacted Mpact cup was impacted into position with approximately 15 of anteversion to help prevent recurrent dislocation. The trials were  then placed and a +4 head gave appropriate length. The +4 head for the 40 Stryker was assembled with the DM liner and then impacted onto the stem which was left in place. The hip was reduced and the foot was taken out of the boot and placed a range of motion and was stable. The wound was irrigated and closed after fibrillar was placed along the capsule to minimized postoperative bleeding. The TFL fascia was closed with a running heavy Quill followed by 3-0 v-loc subcutaneously followed by skin staples and incisional wound VAC. Local anesthetic was infiltrated prior to closure  PLAN OF CARE: Admit to inpatient   PATIENT DISPOSITION:  PACU - hemodynamically stable.

## 2016-12-01 NOTE — Anesthesia Preprocedure Evaluation (Signed)
Anesthesia Evaluation  Patient identified by MRN, date of birth, ID band Patient awake    Reviewed: Allergy & Precautions, NPO status , Patient's Chart, lab work & pertinent test results, reviewed documented beta blocker date and time   Airway Mallampati: II  TM Distance: >3 FB     Dental  (+) Chipped   Pulmonary           Cardiovascular hypertension, Pt. on medications and Pt. on home beta blockers      Neuro/Psych    GI/Hepatic GERD  Controlled,  Endo/Other    Renal/GU Renal disease     Musculoskeletal  (+) Arthritis ,   Abdominal   Peds  Hematology   Anesthesia Other Findings   Reproductive/Obstetrics                             Anesthesia Physical Anesthesia Plan  ASA: III  Anesthesia Plan: Spinal   Post-op Pain Management:    Induction:   PONV Risk Score and Plan:   Airway Management Planned:   Additional Equipment:   Intra-op Plan:   Post-operative Plan:   Informed Consent: I have reviewed the patients History and Physical, chart, labs and discussed the procedure including the risks, benefits and alternatives for the proposed anesthesia with the patient or authorized representative who has indicated his/her understanding and acceptance.     Plan Discussed with: CRNA  Anesthesia Plan Comments:         Anesthesia Quick Evaluation

## 2016-12-01 NOTE — OR Nursing (Signed)
Explanted acetabular shell, liner, and head from right hip

## 2016-12-01 NOTE — Anesthesia Procedure Notes (Addendum)
Spinal  Patient location during procedure: OR Staffing Anesthesiologist: Gunnar Bulla Resident/CRNA: Rolla Plate Performed: resident/CRNA  Preanesthetic Checklist Completed: patient identified, site marked, surgical consent, pre-op evaluation, timeout performed, IV checked, risks and benefits discussed and monitors and equipment checked Spinal Block Patient position: sitting Prep: ChloraPrep and site prepped and draped Patient monitoring: heart rate, continuous pulse ox, blood pressure and cardiac monitor Approach: midline Location: L3-4 Injection technique: single-shot Needle Needle type: Introducer and Pencan  Needle gauge: 24 G Needle length: 9 cm Assessment Sensory level: T10 Additional Notes Negative paresthesia. Negative blood return. Positive free-flowing CSF. Expiration date of kit checked and confirmed. Patient tolerated procedure well, without complications.

## 2016-12-01 NOTE — H&P (Signed)
Subjective:   Patient is a 83 y.o. female presents with Left hip instability. Patient suffered a left femoral neck fracture 09/06/2016 and then had a left hip hemiarthroplasty by Dr. Mack Guise on 4/18.  She underwent rehabilitation and unfortunately on 6/13 suffered a posterior dislocation and subsequent closed reduction followed by a second dislocation on 7/4. Initial procedure was done through posterior approach and she has had posterior dislocation. There is no apparent posterior acetabular fracture but she is clearly unstable. Based on her prior x-rays there may been slight subsidence and no rotation of the stem she's coming in now for revision to total hip hopefully just the acetabular component with anteverted cup to prevent recurrent dislocation however if the stem is loose were markedly malrotated that will need revision as well. Plan on doing this the anterior approach.  Patient Active Problem List   Diagnosis Date Noted  . Recurrent posterior dislocation of hip, left 11/23/2016  . Hip dislocation, left ([place]) 11/02/2016  . HLD (hyperlipidemia) 10/12/2016  . Fracture of femoral neck, left ([place]) 09/07/2016  . Essential hypertension, benign 06/23/2016  . Generalized osteoarthritis of multiple sites 06/23/2016  . Sleep disturbance 06/23/2016  . Aphasia 04/21/2016  . CKD (chronic kidney disease), stage III 04/21/2016   Past Medical History:  Diagnosis Date  . Aphasia 04/2016   Transient---??TIA  . Asthma   . CKD (chronic kidney disease)   . Edema   . Femur fracture, left ([place]) 09/06/2016  . GERD (gastroesophageal reflux disease)   . Hyperlipidemia   . Hypertension   . Osteoporosis     Past Surgical History:  Procedure Laterality Date  . HIP ARTHROPLASTY Left 09/07/2016   Procedure: ARTHROPLASTY BIPOLAR HIP (HEMIARTHROPLASTY);  Surgeon: Thornton Park, MD;  Location: [facility] ORS;  Service: Orthopedics;  Laterality: Left;  . HIP CLOSED REDUCTION Left 11/03/2016   Procedure: CLOSED  REDUCTION HIP;  Surgeon: Thornton Park, MD;  Location: [facility] ORS;  Service: Orthopedics;  Laterality: Left;  . HIP CLOSED REDUCTION Left 11/24/2016   Procedure: CLOSED REDUCTION HIP;  Surgeon: Thornton Park, MD;  Location: [facility] ORS;  Service: Orthopedics;  Laterality: Left;  . KYPHOPLASTY      Prescriptions Prior to Admission  Medication Sig Dispense Refill Last Dose  . amLODipine (NORVASC) 10 MG tablet Take 1 tablet (10 mg total) by mouth daily. (Patient taking differently: Take 10 mg by mouth every morning. ) 30 tablet 0 12/01/2016 at 0600  . hydrALAZINE (APRESOLINE) 25 MG tablet Take 1 tablet (25 mg total) by mouth 4 (four) times daily. (Patient taking differently: Take 25 mg by mouth 4 (four) times daily. 8,12,4,8) 120 tablet 1 12/01/2016 at 0600  . metoprolol succinate (TOPROL-XL) 50 MG 24 hr tablet Take 1 tablet (50 mg total) by mouth daily. Take with or immediately following a meal. (Patient taking differently: Take 50 mg by mouth every morning. Take with or immediately following a meal.) 30 tablet 1 12/01/2016 at 0600  . oxyCODONE-acetaminophen (PERCOCET/ROXICET) 5-325 MG tablet Take 1 tablet by mouth every 6 (six) hours as needed for severe pain. 30 tablet 0 12/01/2016 at 0600  . acetaminophen (TYLENOL) 325 MG tablet Take 2 tablets (650 mg total) by mouth every 6 (six) hours as needed for mild pain.   prn at prn  . aspirin EC 81 MG tablet Take 81 mg by mouth every other day.   11/23/2016 at 0800  . atorvastatin (LIPITOR) 80 MG tablet Take 1 tablet by mouth every evening.    11/22/2016 at 2000  .  ferrous sulfate 325 (65 FE) MG tablet Take 325 mg by mouth daily with breakfast.   11/23/2016 at 0800  . losartan (COZAAR) 50 MG tablet Take 1 tablet by mouth every evening.    11/23/2016 at 0800  . Melatonin 3 MG TABS Take 1 tablet by mouth at bedtime.   11/22/2016 at 2000  . Multiple Vitamin (MULTI-VITAMINS) TABS Take 1 tablet by mouth daily.   11/23/2016 at 0800  . oxycodone-acetaminophen (PERCOCET) 2.5-325  MG tablet Take 1 tablet by mouth every 6 (six) hours as needed for pain. FOR SEVERE PAIN     . polyethylene glycol (MIRALAX / GLYCOLAX) packet Take 17 g by mouth daily.   11/23/2016 at 0800  . senna (SENOKOT) 8.6 MG tablet Take 2 tablets by mouth 2 (two) times daily.    11/23/2016 at 0800  . traMADol (ULTRAM) 50 MG tablet Take 1 tablet (50 mg total) by mouth every 6 (six) hours as needed for moderate pain. 60 tablet 2   . vitamin B-12 (CYANOCOBALAMIN) 1000 MCG tablet Take 1,000 mcg by mouth daily.   11/23/2016 at 0800  . Vitamin D, Cholecalciferol, 1000 units CAPS Take 1 capsule by mouth daily.   11/23/2016 at 0800   Allergies  Allergen Reactions  . Morphine And Related Nausea Only    Social History  Substance Use Topics  . Smoking status: Never Smoker  . Smokeless tobacco: Never Used  . Alcohol use No    Family History  Problem Relation Age of Onset  . Hypertension Other     Review of Systems Pertinent items are noted in HPI.  Objective:   Patient Vitals for the past 8 hrs:  BP Temp Temp src Pulse Resp SpO2 Height Weight  12/01/16 0908 (!) 127/42 98.2 F (36.8 C) Oral 75 18 98 % 5\' 4"  (1.626 m) 58.1 kg (128 lb)   No intake/output data recorded. No intake/output data recorded.    BP (!) 127/42   Pulse 75   Temp 98.2 F (36.8 C) (Oral)   Resp 18   Ht 5\' 4"  (1.626 m)   Wt 58.1 kg (128 lb)   SpO2 98%   BMI 21.97 kg/m  General appearance: alert, cooperative and appears stated age Lungs: clear to auscultation bilaterally Heart: regular rate and rhythm, S1, S2 normal, no murmur, click, rub or gallop Extremities: extremities normal, atraumatic, no cyanosis or edema with well-healed posterior scar left hip, quarter inch shortening on the left Pulses: 2+ and symmetric   Data Review prior x-rays show the dislocations with possibly some shortening on the left side compared to the right compound comparison to the initial postop film  Assessment:   Active Problems:   * No active  hospital problems. * Recurrent dislocation left hip hemiarthroplasty  Plan:   Revision to total hip with dual mobility cup with plan for more anteverted cup to aid in stability and prevent recurrent dislocation

## 2016-12-01 NOTE — OR Nursing (Signed)
Lab tech in for T&S and ESR blood draw

## 2016-12-01 NOTE — Evaluation (Signed)
Physical Therapy Evaluation Patient Details Name: Jacqueline Sanchez MRN: [id number] DOB: [birth year] Today's Date: 12/01/2016   History of Present Illness  Patient is a 83 y.o. female presents with left hip instability. Patient suffered a left femoral neck fracture 09/06/2016 and then had a left hip hemiarthroplasty by Dr. Mack Guise on 4/18.  She underwent rehabilitation and unfortunately on 6/13 suffered a posterior dislocation and subsequent closed reduction followed by a second dislocation on 7/4. Initial procedure was done through posterior approach and she has had posterior dislocations. Pt has now undergone a L anterior approach total hip to replace her posterior hemiarthroplasty. Pt reports significant pain at time of PT evaluation. She refuses to attempt transfers or ambulation but agrees to bed exercises.   Clinical Impression  Pt admitted with above diagnosis. Pt currently with functional limitations due to the deficits listed below (see PT Problem List).  Pt is very pleasant during evaluation and politely refuses any attempts at bed mobility, transfers, or ambulation during evaluation secondary to L hip pain. She does however agree to bed exercises with therapist and is able to complete as directed. She reports increase in pain with all motion of LLE. Reviewed anterior hip precautions and educated patient regarding rehabilitation for her surgery. Will perform out of bed assessment tomorrow but pt will mostly certainly need SNF placement due to prolonged debility secondary to hemiarthroplasty secondary to hip fracture followed by two dislocations. Pt will benefit from PT services to address deficits in strength, balance, and mobility in order to return to full function at home.     Follow Up Recommendations SNF    Equipment Recommendations  None recommended by PT    Recommendations for Other Services OT consult     Precautions / Restrictions Precautions Precautions: Anterior  Hip Precaution Booklet Issued: Yes (comment) Restrictions Weight Bearing Restrictions: Yes LLE Weight Bearing: Weight bearing as tolerated      Mobility  Bed Mobility               General bed mobility comments: Pt declined bed mobility, transfers, or ambulation on this date  Transfers                    Ambulation/Gait                Stairs            Wheelchair Mobility    Modified Rankin (Stroke Patients Only)       Balance                                             Pertinent Vitals/Pain Pain Assessment: 0-10 Pain Score: 5  Pain Location: Left hip Pain Descriptors / Indicators: Sharp Pain Intervention(s): Monitored during session;Premedicated before session    Home Living Family/patient expects to be discharged to:: [place]: Gilford Rile - 2 wheels;Cane - single point Management consultant) Additional Comments: Resides at [facility]    Prior Function Level of Independence: Needs assistance   Gait / Transfers Assistance Needed: Prior to initial surgery pt was at [facility] ambulating with RW for longer distanes vs SPC for short distance. Two falls in the last 12 months. Since the dislocations following the hemiarthroplasty pt has been mostly nonambulatory. She was wearing a L  hip abduction brace and was NWB on that side  ADL's / Homemaking Assistance Needed: Pt requiring assist for ADLs/IADLs. Was previously walking to dining hall for meals        Hand Dominance   Dominant Hand: Right    Extremity/Trunk Assessment   Upper Extremity Assessment Upper Extremity Assessment: Defer to OT evaluation    Lower Extremity Assessment Lower Extremity Assessment: LLE deficits/detail LLE Deficits / Details: Pt requiring assistance for L SLR and SAQ. Reports increase in pain with all motions of L hip but is easily distracted in conversation. DF/PF at least 3/5. Reports intact  sensation to LLE       Communication   Communication: No difficulties  Cognition Arousal/Alertness: Awake/alert Behavior During Therapy: WFL for tasks assessed/performed Overall Cognitive Status: Within Functional Limits for tasks assessed                                        General Comments      Exercises Total Joint Exercises Ankle Circles/Pumps: AROM;Both;10 reps;Supine Quad Sets: Strengthening;Both;10 reps;Supine Gluteal Sets: Strengthening;Both;10 reps;Supine Short Arc Quad: Strengthening;Left;10 reps;Supine Heel Slides: Strengthening;Left;10 reps;Supine Hip ABduction/ADduction: Strengthening;Left;10 reps;Supine Straight Leg Raises: Strengthening;Left;10 reps;Supine   Assessment/Plan    PT Assessment Patient needs continued PT services  PT Problem List Decreased strength;Decreased range of motion;Decreased activity tolerance;Decreased balance;Decreased knowledge of precautions;Pain;Decreased mobility       PT Treatment Interventions DME instruction;Gait training;Functional mobility training;Therapeutic activities;Therapeutic exercise;Balance training;Patient/family education;Neuromuscular re-education;Manual techniques    PT Goals (Current goals can be found in the Care Plan section)  Acute Rehab PT Goals Patient Stated Goal: Return to SNF to complete rehab and return to as high level of function as possible PT Goal Formulation: With patient Time For Goal Achievement: 12/09/16 Potential to Achieve Goals: Good    Frequency BID   Barriers to discharge        Co-evaluation               AM-PAC PT "6 Clicks" Daily Activity  Outcome Measure Difficulty turning over in bed (including adjusting bedclothes, sheets and blankets)?: Total Difficulty moving from lying on back to sitting on the side of the bed? : Total Difficulty sitting down on and standing up from a chair with arms (e.g., wheelchair, bedside commode, etc,.)?: Total Help needed  moving to and from a bed to chair (including a wheelchair)?: A Lot Help needed walking in hospital room?: A Lot Help needed climbing 3-5 steps with a railing? : A Lot 6 Click Score: 9    End of Session   Activity Tolerance: Patient limited by pain (Refuses bed mobility, transfers, or ambulation) Patient left: in bed;with call bell/phone within reach;with bed alarm set;with SCD's reapplied;Other (comment) (wound vac in place, towel roll under heels) Nurse Communication: Mobility status PT Visit Diagnosis: History of falling (Z91.81);Other abnormalities of gait and mobility (R26.89);Pain;Muscle weakness (generalized) (M62.81) Pain - Right/Left: Left Pain - part of body: Hip    Time: 8768-1157 PT Time Calculation (min) (ACUTE ONLY): 25 min   Charges:   PT Evaluation $PT Eval Low Complexity: 1 Procedure PT Treatments $Therapeutic Exercise: 8-22 mins   PT G Codes:   PT G-Codes **NOT FOR INPATIENT CLASS** Functional Assessment Tool Used: AM-PAC 6 Clicks Basic Mobility Functional Limitation: Mobility: Walking and moving around Mobility: Walking and Moving Around Current Status (W6203): At least 60 percent but less than 80 percent impaired,  limited or restricted Mobility: Walking and Moving Around Goal Status [phone]): At least 20 percent but less than 40 percent impaired, limited or restricted    Phillips Grout PT, DPT    Rishav Rockefeller 12/01/2016, 4:11 PM

## 2016-12-02 ENCOUNTER — Encounter: Payer: Self-pay | Admitting: Orthopedic Surgery

## 2016-12-02 LAB — BASIC METABOLIC PANEL
ANION GAP: 9 (ref 5–15)
BUN: 19 mg/dL (ref 6–20)
CHLORIDE: 100 mmol/L — AB (ref 101–111)
CO2: 23 mmol/L (ref 22–32)
Calcium: 9.7 mg/dL (ref 8.9–10.3)
Creatinine, Ser: 1.13 mg/dL — ABNORMAL HIGH (ref 0.44–1.00)
GFR calc non Af Amer: 40 mL/min — ABNORMAL LOW (ref >60)
GFR, EST AFRICAN AMERICAN: 47 mL/min — AB (ref >60)
Glucose, Bld: 153 mg/dL — ABNORMAL HIGH (ref 65–99)
POTASSIUM: 4.1 mmol/L (ref 3.5–5.1)
SODIUM: 132 mmol/L — AB (ref 135–145)

## 2016-12-02 MED ORDER — OXYCODONE HCL 5 MG PO TABS: 5.0000 mg | ORAL_TABLET | ORAL | 0 refills | Status: DC | PRN

## 2016-12-02 MED ORDER — ENOXAPARIN SODIUM 40 MG/0.4ML ~~LOC~~ SOLN: 40.0000 mg | SUBCUTANEOUS | 0 refills | Status: DC

## 2016-12-02 NOTE — Care Management Important Message (Addendum)
Important Message  Patient Details  Name: Jacqueline Sanchez MRN: [id number] Date of Birth: 1919/11/04   Medicare Important Message Given: NA- LOS < 3/ Initital given by admissions   Jolly Mango, RN 12/02/2016, 11:59 AM

## 2016-12-02 NOTE — Progress Notes (Addendum)
   Subjective: 1 Day Post-Op Procedure(s) (LRB): ANTERIOR HIP REVISION, REVISION TO TOTAL (Left) Patient reports pain as moderate.   Patient is well, and has had no acute complaints or problems Denies any CP, SOB, ABD pain. We will continue therapy today.  Plan is to go Skilled nursing facility after hospital stay.  Objective: Vital signs in last 24 hours: Temp:  [97.8 F (36.6 C)-99.4 F (37.4 C)] 99.4 F (37.4 C) (07/13 0749) Pulse Rate:  [74-97] 97 (07/13 0749) Resp:  [11-20] 16 (07/13 0749) BP: (122-162)/(42-89) 158/51 (07/13 0749) SpO2:  [94 %-99 %] 95 % (07/13 0749) Weight:  [58.1 kg (128 lb)] 58.1 kg (128 lb) (07/12 0908)  Intake/Output from previous day: 07/12 0701 - 07/13 0700 In: 1750 [I.V.:1600; IV Piggyback:150] Out: 2150 [Urine:2000; Blood:150] Intake/Output this shift: Total I/O In: 300 [I.V.:300] Out: -    Recent Labs  12/01/16 1507  HGB 12.2    Recent Labs  12/01/16 1507  WBC 16.2*  RBC 3.71*  HCT 36.9  PLT 324    Recent Labs  12/01/16 1507  CREATININE 1.14*   No results for input(s): LABPT, INR in the last 72 hours.  EXAM General - Patient is Alert, Appropriate and Oriented Extremity - Neurovascular intact Sensation intact distally Intact pulses distally Dorsiflexion/Plantar flexion intact No cellulitis present Compartment soft Dressing - dressing C/D/I and no drainage Motor Function - intact, moving foot and toes well on exam.   Past Medical History:  Diagnosis Date  . Aphasia 04/2016   Transient---??TIA  . Asthma   . CKD (chronic kidney disease)   . Edema   . Femur fracture, left ([place]) 09/06/2016  . GERD (gastroesophageal reflux disease)   . Hyperlipidemia   . Hypertension   . Osteoporosis     Assessment/Plan:   1 Day Post-Op Procedure(s) (LRB): ANTERIOR HIP REVISION, REVISION TO TOTAL (Left) Active Problems:   Recurrent dislocation, left hip  Estimated body mass index is 21.97 kg/m as calculated from the  following:   Height as of this encounter: 5\' 4"  (1.626 m).   Weight as of this encounter: 58.1 kg (128 lb). Advance diet Up with therapy  Needs BM Recheck labs in the am BMP pending  CM to assist with discharge  Remove wound vac on 12/09/16, apply honey comb dressing. Keep incision clean and dry TED hose BLE x 6 weeks, remove at night time Follow up with [facility] in 2 weeks for staple removal  DVT Prophylaxis - Lovenox, Foot Pumps and TED hose Weight-Bearing as tolerated to left leg   T. Rachelle Hora, PA-C [place] 12/02/2016, 8:08 AM

## 2016-12-02 NOTE — Progress Notes (Signed)
Physical Therapy Treatment Patient Details Name: Jacqueline Sanchez MRN: [id number] DOB: 10-16-1919 Today's Date: 12/02/2016    History of Present Illness Patient is a 83 y.o. female presents with left hip instability. Patient suffered a left femoral neck fracture 09/06/2016 and then had a left hip hemiarthroplasty by Dr. Mack Guise on 4/18.  She underwent rehabilitation and unfortunately on 6/13 suffered a posterior dislocation and subsequent closed reduction followed by a second dislocation on 7/4. Initial procedure was done through posterior approach and she has had posterior dislocations. Pt has now undergone a L anterior approach total hip to replace her posterior hemiarthroplasty. Pt reports significant pain at time of PT evaluation. She refuses to attempt transfers or ambulation but agrees to bed exercises.     PT Comments    Pt performs bed mobility, tranfers, and ambulation with MinA and RW, requiring min-mod verbal cues for correct mechanics and safety awareness. Pt with tendency to lean posteriorly, requiring verbal and tactile cues for forward weight shifting with STS transfers. Able to recall precautions ~30% of the time. Constant reminders to avoid excessive L hip IR with amb. Pt amb total of 25 ft, needing increased time for task. Pt presents with the following deficits: strength, rom, endurance, and balance. Overall, pt responded well to today's treatment with no adverse affects, and is progressing towards functional goals. Pt would benefit from skilled PT to address the previously mentioned impairments and promote return to PLOF. Currently recommending SNF, pending d/c.    Follow Up Recommendations  SNF     Equipment Recommendations  None recommended by PT    Recommendations for Other Services       Precautions / Restrictions Precautions Precautions: Anterior Hip Precaution Booklet Issued: Yes (comment) Precaution Comments: Left Hip Posterior Precautions Restrictions Weight  Bearing Restrictions: Yes LLE Weight Bearing: Weight bearing as tolerated    Mobility  Bed Mobility Overal bed mobility: Needs Assistance Bed Mobility: Supine to Sit     Supine to sit: Min assist     General bed mobility comments: MinA with bed mobility , requiring min verbal/tactile/visual cues, and physical assist via bed pad for scooting to EOB. followed cues well.   Transfers Overall transfer level: Needs assistance Equipment used: Rolling walker (2 wheeled) Transfers: Sit to/from Stand Sit to Stand: Min assist         General transfer comment: requires increased time to achieve task. MinA for physical assist. Requires mod verbal cues for correct mechanics adn safety awareness. EPt education re: weight bearing status and precautions.   Ambulation/Gait Ambulation/Gait assistance: Min assist Ambulation Distance (Feet): 25 Feet Assistive device: Rolling walker (2 wheeled) Gait Pattern/deviations: Decreased step length - right;Decreased step length - left;Trunk flexed     General Gait Details: requires mod verbal cues for correct mechanics and safety awareness. Slow gait velocity, requires increased time for turns. Tendency for internal L hip rotation with amb.'   Stairs            Wheelchair Mobility    Modified Rankin (Stroke Patients Only)       Balance                                            Cognition Arousal/Alertness: Awake/alert Behavior During Therapy: WFL for tasks assessed/performed Overall Cognitive Status: Within Functional Limits for tasks assessed  Exercises Other Exercises Other Exercises: supine therex performed to L LE with supervision-MinA x10 reps: ankle pumps (B), quad sets, glute sets, SLR (minA), and hip abd (minA). Pt followed verbal and tactile cues well. Pt education re: cont to avoid extreme motions of the R hip in all directions, given hx of post  dislocations and current ant precautions. Other Exercises: (P)  toileting: minA and use of RW for transfer. mod cues for safety awareness adn correct mechancis and mina for management of clothing. Supervision for hygiene.     General Comments        Pertinent Vitals/Pain Pain Assessment: 0-10 Pain Score: 7  Pain Location: Left hip Pain Descriptors / Indicators: Sharp Pain Intervention(s): Limited activity within patient's tolerance;Monitored during session;Premedicated before session;Repositioned;Relaxation    Home Living                      Prior Function            PT Goals (current goals can now be found in the care plan section) Acute Rehab PT Goals Patient Stated Goal: Return to SNF to complete rehab and return to as high level of function as possible PT Goal Formulation: With patient Time For Goal Achievement: 12/09/16 Potential to Achieve Goals: Good Progress towards PT goals: Progressing toward goals    Frequency    BID      PT Plan Current plan remains appropriate    Co-evaluation              AM-PAC PT "6 Clicks" Daily Activity  Outcome Measure  Difficulty turning over in bed (including adjusting bedclothes, sheets and blankets)?: Total Difficulty moving from lying on back to sitting on the side of the bed? : Total Difficulty sitting down on and standing up from a chair with arms (e.g., wheelchair, bedside commode, etc,.)?: Total Help needed moving to and from a bed to chair (including a wheelchair)?: A Lot Help needed walking in hospital room?: A Lot Help needed climbing 3-5 steps with a railing? : Total 6 Click Score: 8    End of Session Equipment Utilized During Treatment: Gait belt Activity Tolerance: Patient limited by pain Patient left: in chair;with call bell/phone within reach;with chair alarm set;with SCD's reapplied Nurse Communication: Mobility status PT Visit Diagnosis: History of falling (Z91.81);Other abnormalities of  gait and mobility (R26.89);Pain;Muscle weakness (generalized) (M62.81) Pain - Right/Left: Left Pain - part of body: Hip     Time: 2542-7062 PT Time Calculation (min) (ACUTE ONLY): 41 min  Charges:                       G Codes:       Oran Rein PT, SPT   Bevelyn Ngo 12/02/2016, 10:19 AM

## 2016-12-02 NOTE — Anesthesia Postprocedure Evaluation (Signed)
Anesthesia Post Note  Patient: Jacqueline Sanchez  Procedure(s) Performed: Procedure(s) (LRB): ANTERIOR HIP REVISION, REVISION TO TOTAL (Left)  Patient location during evaluation: Nursing Unit Anesthesia Type: Spinal Level of consciousness: awake, awake and alert and oriented Pain management: pain level controlled Vital Signs Assessment: post-procedure vital signs reviewed and stable Respiratory status: spontaneous breathing Cardiovascular status: blood pressure returned to baseline Postop Assessment: no headache, no backache, no signs of nausea or vomiting and adequate PO intake Anesthetic complications: no     Last Vitals:  Vitals:   12/02/16 0328 12/02/16 0653  BP: (!) 155/56 (!) 157/53  Pulse: 86 92  Resp: 19 18  Temp: 36.9 C 37.2 C    Last Pain:  Vitals:   12/02/16 0653  TempSrc: Oral  PainSc:                  Hedda Slade

## 2016-12-02 NOTE — NC FL2 (Signed)
[place] LEVEL OF CARE SCREENING TOOL     IDENTIFICATION  Patient Name: Jacqueline Sanchez Birthdate: 1919-08-09 Sex: female Admission Date (Current Location): 12/01/2016  [place] and [place] Number:  Engineering geologist and Address:  [facility], [address] [place], [place] [address]      Provider Number: [id number]  Attending Physician Name and Address:  Hessie Knows, MD  Relative Name and Phone Number:       Current Level of Care: Hospital Recommended Level of Care: [place] Prior Approval Number:    Date Approved/Denied:   PASRR Number: [id number]  Discharge Plan: SNF    Current Diagnoses: Patient Active Problem List   Diagnosis Date Noted  . Recurrent dislocation, left hip 12/01/2016  . Recurrent posterior dislocation of hip, left 11/23/2016  . Hip dislocation, left ([place]) 11/02/2016  . HLD (hyperlipidemia) 10/12/2016  . Fracture of femoral neck, left ([place]) 09/07/2016  . Essential hypertension, benign 06/23/2016  . Generalized osteoarthritis of multiple sites 06/23/2016  . Sleep disturbance 06/23/2016  . Aphasia 04/21/2016  . CKD (chronic kidney disease), stage III 04/21/2016    Orientation RESPIRATION BLADDER Height & Weight     Self, Time, Situation, Place  Normal Continent Weight: 128 lb (58.1 kg) Height:  5\' 4"  (162.6 cm)  BEHAVIORAL SYMPTOMS/MOOD NEUROLOGICAL BOWEL NUTRITION STATUS      Continent Diet (Regular Diet, Thin Liquids)  AMBULATORY STATUS COMMUNICATION OF NEEDS Skin   Extensive Assist Verbally Normal                       Personal Care Assistance Level of Assistance  Bathing, Feeding, Dressing Bathing Assistance: Limited assistance Feeding assistance: Independent Dressing Assistance: Limited assistance     Functional Limitations Info  Sight, Hearing, Speech Sight Info: Adequate Hearing Info: Adequate Speech Info: Adequate    SPECIAL CARE FACTORS FREQUENCY  PT (By  licensed PT), OT (By licensed OT)     PT Frequency: 5 OT Frequency: 5            Contractures Contractures Info: Not present    Additional Factors Info  Code Status, Allergies Code Status Info: Full Code Allergies Info: Morphine and Related           Current Medications (12/02/2016):  This is the current hospital active medication list Current Facility-Administered Medications  Medication Dose Route Frequency Provider Last Rate Last Dose  . 0.9 %  sodium chloride infusion   Intravenous Continuous Hessie Knows, MD 75 mL/hr at 12/02/16 0445    . acetaminophen (TYLENOL) tablet 650 mg  650 mg Oral Q6H PRN Hessie Knows, MD       Or  . acetaminophen (TYLENOL) suppository 650 mg  650 mg Rectal Q6H PRN Hessie Knows, MD      . amLODipine (NORVASC) tablet 10 mg  10 mg Oral Claudine Mouton, Legrand Como, MD   10 mg at 12/02/16 0654  . aspirin EC tablet 81 mg  81 mg Oral Ceasar Lund, MD      . atorvastatin (LIPITOR) tablet 80 mg  80 mg Oral QPM Hessie Knows, MD   80 mg at 12/01/16 1746  . bisacodyl (DULCOLAX) suppository 10 mg  10 mg Rectal Daily PRN Hessie Knows, MD      . cholecalciferol (VITAMIN D) tablet 1,000 Units  1,000 Units Oral Daily Hessie Knows, MD   1,000 Units at 12/02/16 0919  . diphenhydrAMINE (BENADRYL) 12.5 MG/5ML elixir 12.5-25 mg  12.5-25 mg  Oral Q4H PRN Hessie Knows, MD      . docusate sodium (COLACE) capsule 100 mg  100 mg Oral BID Hessie Knows, MD   100 mg at 12/02/16 0919  . enoxaparin (LOVENOX) injection 30 mg  30 mg Subcutaneous Q24H Hessie Knows, MD   30 mg at 12/02/16 0758  . ferrous sulfate tablet 325 mg  325 mg Oral Q breakfast Hessie Knows, MD   325 mg at 12/02/16 0758  . hydrALAZINE (APRESOLINE) tablet 25 mg  25 mg Oral QID Hessie Knows, MD   25 mg at 12/02/16 0919  . HYDROmorphone (DILAUDID) injection 0.5 mg  0.5 mg Intravenous Q2H PRN Hessie Knows, MD      . losartan (COZAAR) tablet 50 mg  50 mg Oral QPM Hessie Knows, MD   50 mg at 12/01/16 1746   . magnesium citrate solution 1 Bottle  1 Bottle Oral Once PRN Hessie Knows, MD      . magnesium hydroxide (MILK OF MAGNESIA) suspension 30 mL  30 mL Oral Daily PRN Hessie Knows, MD   30 mL at 12/02/16 0654  . Melatonin TABS 5 mg  1 tablet Oral QHS Hessie Knows, MD   5 mg at 12/01/16 2206  . menthol-cetylpyridinium (CEPACOL) lozenge 3 mg  1 lozenge Oral PRN Hessie Knows, MD       Or  . phenol (CHLORASEPTIC) mouth spray 1 spray  1 spray Mouth/Throat PRN Hessie Knows, MD      . methocarbamol (ROBAXIN) tablet 500 mg  500 mg Oral Q6H PRN Hessie Knows, MD   500 mg at 12/01/16 2205   Or  . methocarbamol (ROBAXIN) 500 mg in dextrose 5 % 50 mL IVPB  500 mg Intravenous Q6H PRN Hessie Knows, MD      . metoCLOPramide (REGLAN) tablet 5-10 mg  5-10 mg Oral Q8H PRN Hessie Knows, MD       Or  . metoCLOPramide (REGLAN) injection 5-10 mg  5-10 mg Intravenous Q8H PRN Hessie Knows, MD      . metoprolol succinate (TOPROL-XL) 24 hr tablet 50 mg  50 mg Oral Q breakfast Hessie Knows, MD   50 mg at 12/02/16 0759  . multivitamin with minerals tablet 1 tablet  1 tablet Oral Daily Hessie Knows, MD   1 tablet at 12/02/16 [phone]  . ondansetron (ZOFRAN) tablet 4 mg  4 mg Oral Q6H PRN Hessie Knows, MD       Or  . ondansetron [facility]) injection 4 mg  4 mg Intravenous Q6H PRN Hessie Knows, MD      . oxyCODONE (Oxy IR/ROXICODONE) immediate release tablet 5-10 mg  5-10 mg Oral Q3H PRN Hessie Knows, MD   10 mg at 12/02/16 0758  . polyethylene glycol (MIRALAX / GLYCOLAX) packet 17 g  17 g Oral Daily Hessie Knows, MD   17 g at 12/02/16 0919  . senna (SENOKOT) tablet 17.2 mg  2 tablet Oral BID Hessie Knows, MD   17.2 mg at 12/02/16 0919  . vitamin B-12 (CYANOCOBALAMIN) tablet 1,000 mcg  1,000 mcg Oral Daily Hessie Knows, MD   1,000 mcg at 12/02/16 0919  . zolpidem (AMBIEN) tablet 5 mg  5 mg Oral QHS PRN Hessie Knows, MD         Discharge Medications: Please see discharge summary for a list of discharge  medications.  Relevant Imaging Results:  Relevant Lab Results:   Additional Information SSN:  [id number]  Darden Dates, LCSW

## 2016-12-02 NOTE — Discharge Summary (Addendum)
Physician Discharge Summary  Patient ID: Jacqueline Sanchez MRN: [id number] DOB/AGE: Dec 19, 1919 83 y.o.  Admit date: 12/01/2016 Discharge date: 12/03/2016  Admission Diagnoses:  CLOSED DISLOCATION OF LEFT HIP Anterior hip revision to total hip arthoplasty.  Discharge Diagnoses: Patient Active Problem List   Diagnosis Date Noted  . Recurrent dislocation, left hip 12/01/2016  . Recurrent posterior dislocation of hip, left 11/23/2016  . Hip dislocation, left ([place]) 11/02/2016  . HLD (hyperlipidemia) 10/12/2016  . Fracture of femoral neck, left ([place]) 09/07/2016  . Essential hypertension, benign 06/23/2016  . Generalized osteoarthritis of multiple sites 06/23/2016  . Sleep disturbance 06/23/2016  . Aphasia 04/21/2016  . CKD (chronic kidney disease), stage III 04/21/2016  Anterior hip revision to total hip arthroplasty.  Past Medical History:  Diagnosis Date  . Aphasia 04/2016   Transient---??TIA  . Asthma   . CKD (chronic kidney disease)   . Edema   . Femur fracture, left ([place]) 09/06/2016  . GERD (gastroesophageal reflux disease)   . Hyperlipidemia   . Hypertension   . Osteoporosis      Transfusion: none   Consultants (if any):   Discharged Condition: Improved  Hospital Course: Jacqueline Sanchez is an 83 y.o. female who was admitted 12/01/2016 with a diagnosis of unstable left total hip and went to the operating room on 12/01/2016 and underwent the above named procedures.    Surgeries: Procedure(s): ANTERIOR HIP REVISION, REVISION TO TOTAL on 12/01/2016 Patient tolerated the surgery well. Taken to PACU where she was stabilized and then transferred to the orthopedic floor.  Started on Lovenox 30 q 12 hrs. Foot pumps applied bilaterally at 80 mm. Heels elevated on bed with rolled towels. No evidence of DVT. Negative [place]. Physical therapy started on day #1 for gait training and transfer. OT started day #1 for ADL and assisted devices.  Patient's foley was d/c on day #1.   Patient had a BM on POD2.  Implants: Medacta Mpact DM cup 52 mm with liner and plus 4V 40 Stryker 28 mm metal head  Remove wound vac on 12/09/16, apply honey comb dressing. Keep incision clean and dry  She was given perioperative antibiotics:  Anti-infectives    Start     Dose/Rate Route Frequency Ordered Stop   12/01/16 1400  ceFAZolin (ANCEF) IVPB 1 g/50 mL premix     1 g 100 mL/hr over 30 Minutes Intravenous Every 6 hours 12/01/16 1348 12/02/16 0304   11/30/16 2230  ceFAZolin (ANCEF) IVPB 2g/100 mL premix     2 g 200 mL/hr over 30 Minutes Intravenous  Once 11/30/16 2219 12/01/16 1126    .  She was given sequential compression devices, early ambulation, and Lovenox for DVT prophylaxis.  She benefited maximally from the hospital stay and there were no complications.    Recent vital signs:  Vitals:   12/03/16 0611 12/03/16 0724  BP: (!) 148/54 (!) 130/38  Pulse: 89 88  Resp: 20 18  Temp: 98.8 F (37.1 C) 99 F (37.2 C)    Recent laboratory studies:  Lab Results  Component Value Date   HGB 10.2 (L) 12/03/2016   HGB 12.2 12/01/2016   HGB 13.1 11/23/2016   Lab Results  Component Value Date   WBC 11.3 (H) 12/03/2016   PLT 266 12/03/2016   Lab Results  Component Value Date   INR 0.85 11/23/2016   Lab Results  Component Value Date   NA 136 12/03/2016   K 4.3 12/03/2016   CL 102 12/03/2016   CO2  28 12/03/2016   BUN 18 12/03/2016   CREATININE 1.05 (H) 12/03/2016   GLUCOSE 108 (H) 12/03/2016    Discharge Medications:   Allergies as of 12/03/2016      Reactions   Morphine And Related Nausea Only      Medication List    STOP taking these medications   traMADol 50 MG tablet Commonly known as:  ULTRAM     TAKE these medications   acetaminophen 325 MG tablet Commonly known as:  TYLENOL Take 2 tablets (650 mg total) by mouth every 6 (six) hours as needed for mild pain.   amLODipine 10 MG tablet Commonly known as:  NORVASC Take 1 tablet (10 mg total) by  mouth daily. What changed:  when to take this   aspirin EC 81 MG tablet Take 81 mg by mouth every other day.   atorvastatin 80 MG tablet Commonly known as:  LIPITOR Take 1 tablet by mouth every evening.   enoxaparin 40 MG/0.4ML injection Commonly known as:  LOVENOX Inject 0.4 mLs (40 mg total) into the skin daily.   ferrous sulfate 325 (65 FE) MG tablet Take 325 mg by mouth daily with breakfast.   hydrALAZINE 25 MG tablet Commonly known as:  APRESOLINE Take 1 tablet (25 mg total) by mouth 4 (four) times daily. What changed:  additional instructions   losartan 50 MG tablet Commonly known as:  COZAAR Take 1 tablet by mouth every evening.   Melatonin 3 MG Tabs Take 1 tablet by mouth at bedtime.   metoprolol succinate 50 MG 24 hr tablet Commonly known as:  TOPROL-XL Take 1 tablet (50 mg total) by mouth daily. Take with or immediately following a meal. What changed:  when to take this  additional instructions   MULTI-VITAMINS Tabs Take 1 tablet by mouth daily.   oxyCODONE 5 MG immediate release tablet Commonly known as:  Oxy IR/ROXICODONE Take 1-2 tablets (5-10 mg total) by mouth every 4 (four) hours as needed for breakthrough pain.   oxycodone-acetaminophen 2.5-325 MG tablet Commonly known as:  PERCOCET Take 1 tablet by mouth every 6 (six) hours as needed for pain. FOR SEVERE PAIN What changed:  Another medication with the same name was removed. Continue taking this medication, and follow the directions you see here.   polyethylene glycol packet Commonly known as:  MIRALAX / GLYCOLAX Take 17 g by mouth daily.   senna 8.6 MG tablet Commonly known as:  SENOKOT Take 2 tablets by mouth 2 (two) times daily.   vitamin B-12 1000 MCG tablet Commonly known as:  CYANOCOBALAMIN Take 1,000 mcg by mouth daily.   Vitamin D (Cholecalciferol) 1000 units Caps Take 1 capsule by mouth daily.       Diagnostic Studies: Pelvis Portable  Result Date: 11/24/2016 CLINICAL  DATA:  Postop ORIF EXAM: PORTABLE PELVIS 1-2 VIEWS COMPARISON:  09/07/2016 FINDINGS: Changes of left hip replacement. Normal AP alignment. No hardware bony complicating feature. IMPRESSION: Left hip replacement.  No complicating feature. Electronically Signed   By: Rolm Baptise M.D.   On: 11/24/2016 12:58   Dg Hip Port Unilat With Pelvis 1v Left  Result Date: 11/03/2016 CLINICAL DATA:  Postop left hip dislocation EXAM: DG HIP (WITH OR WITHOUT PELVIS) 1V PORT LEFT COMPARISON:  11/02/2016 FINDINGS: Reduction of left bipolar hip arthroplasty without evidence of postreduction hardware failure or fracture. IMPRESSION: Satisfactory reduction of left bipolar hip arthroplasty without post procedural hardware failure nor fracture. Alignment appears anatomic. Electronically Signed   By: Ashley Royalty  M.D.   On: 11/03/2016 13:49   Dg Hip Operative Unilat W Or W/o Pelvis Left  Result Date: 12/01/2016 CLINICAL DATA:  Left hip hemiarthroplasty EXAM: OPERATIVE LEFT HIP (WITH PELVIS IF PERFORMED) 3 VIEWS TECHNIQUE: Fluoroscopic spot image(s) were submitted for interpretation post-operatively. COMPARISON:  None FLUOROSCOPY TIME:  0.2 minute 6.3 mGy FINDINGS: Interval left hip hemiarthroplasty without failure or complication. Normal alignment. IMPRESSION: Interval left hip hemiarthroplasty. Electronically Signed   By: Kathreen Devoid   On: 12/01/2016 11:55   Dg Hip Operative Unilat W Or W/o Pelvis Left  Result Date: 11/24/2016 CLINICAL DATA:  Hip dislocation EXAM: OPERATIVE left HIP (WITH PELVIS IF PERFORMED) 2 VIEWS TECHNIQUE: Fluoroscopic spot image(s) were submitted for interpretation post-operatively. COMPARISON:  Radiography from yesterday FINDINGS: Two images of the left hip show relocation of the hemiarthroplasty. No visible fracture. IMPRESSION: Intraoperative fluoroscopy showing relocated left hip hemiarthroplasty. Electronically Signed   By: Monte Fantasia M.D.   On: 11/24/2016 12:20   Dg Hip Operative Unilat W Or  W/o Pelvis Left  Result Date: 11/03/2016 CLINICAL DATA:  Hip dislocation. EXAM: OPERATIVE LEFT HIP (WITH PELVIS IF PERFORMED) 2 VIEWS TECHNIQUE: Fluoroscopic spot image(s) were submitted for interpretation post-operatively. COMPARISON:  11/02/2016. FINDINGS: The LEFT hip is dislocated on preoperative films. Intraoperative films show satisfactory reduction. IMPRESSION: Satisfactory reduction of LEFT hip dislocation. Electronically Signed   By: Staci Righter M.D.   On: 11/03/2016 13:11   Dg Hip Unilat W Or W/o Pelvis 2-3 Views Left  Result Date: 12/01/2016 CLINICAL DATA:  Status post reduction of left hip dislocation. EXAM: DG HIP (WITH OR WITHOUT PELVIS) 2-3V LEFT COMPARISON:  Plain films left hip 11/23/2016. FINDINGS: The patient's bipolar left hip hemiarthroplasty has been reduced. Surgical drain and staples are in place. No acute abnormality. IMPRESSION: Successful reduction of left hip dislocation.  No new abnormality. Electronically Signed   By: Inge Rise M.D.   On: 12/01/2016 13:02   Dg Hip Unilat W Or Wo Pelvis 2-3 Views Left  Result Date: 11/23/2016 CLINICAL DATA:  Left hip pain EXAM: DG HIP (WITH OR WITHOUT PELVIS) 2-3V LEFT COMPARISON:  11/03/2016 FINDINGS: Frontal pelvis with AP and frog-leg lateral views of the left hip show cranial dislocation of the humeral component. Bones are diffusely demineralized. Patient is status post lower lumbar vertebral augmentation. IMPRESSION: Left femoral component is dislocated. Electronically Signed   By: Misty Stanley M.D.   On: 11/23/2016 16:56   Disposition: Plan is for discharge to [facility] facility today.   Contact information for follow-up providers    Hessie Knows, MD Follow up in 2 week(s).   Specialty:  Orthopedic Surgery Contact information: [place] [address] [phone]            Contact information for after-discharge care    Destination    [facility] Follow up.   Specialty:  [place] information: [place] [address] [place] [phone]                 Signed: Judson Roch PA-C 12/03/2016, 8:37 AM

## 2016-12-02 NOTE — Progress Notes (Signed)
Patient alert and oriented with intermittent confusion. Vital signs stable. PA changed wound vac over to prevena this morning with no drainage. Patient complaining of moderate pain in left hip. Patient out of bed with PT. Has urinated post foley DC. Reoriented to call bell system.   Deri Fuelling, RN

## 2016-12-02 NOTE — Progress Notes (Signed)
Patient refusing labs to be drawn, PA [facility] aware.   Deri Fuelling, RN

## 2016-12-02 NOTE — Progress Notes (Signed)
Physical Therapy Treatment Patient Details Name: Jacqueline Sanchez MRN: [id number] DOB: 1919-06-08 Today's Date: 12/02/2016    History of Present Illness Patient is a 83 y.o. female presents with left hip instability. Patient suffered a left femoral neck fracture 09/06/2016 and then had a left hip hemiarthroplasty by Dr. Mack Guise on 4/18.  She underwent rehabilitation and unfortunately on 6/13 suffered a posterior dislocation and subsequent closed reduction followed by a second dislocation on 7/4. Initial procedure was done through posterior approach and she has had posterior dislocations. Pt has now undergone a L anterior approach total hip to replace her posterior hemiarthroplasty. Pt reports significant pain at time of PT evaluation. She refuses to attempt transfers or ambulation but agrees to bed exercises.     PT Comments    Pt with increased agitation and confusion upon arrival and throughout treatment. Pt consistently pressing buttons on bed, handling portable wound vac, and attempting to get in/out of bed at inappropriate times. Pt performed bed mobility with ModA, tranfers with ModA, and ambulation with MinA due to increased confusion, impulsivity, impaired strength, impaired sequencing, and impaired motor initiation. Pt required mod-max verbal/tactile/visual cues for entirety of treatment to maintain attention on task. Pt amb total of 20 ft with RW and required maxA for cuing and obstacle navigation with RW. Overall, pt with no adverse affects during today's treatment; not progressing towards goals at this time, primarily due to increased confusion. Nursing notified of increased confusion. Pt would benefit from skilled PT to address the previously mentioned impairments and promote return to PLOF. Currently recommending SNF, pending d/c.    Follow Up Recommendations  SNF     Equipment Recommendations  None recommended by PT    Recommendations for Other Services       Precautions /  Restrictions Precautions Precautions: Anterior Hip Precaution Booklet Issued: Yes (comment) Precaution Comments: Left Hip Posterior Precautions Restrictions Weight Bearing Restrictions: Yes LLE Weight Bearing: Weight bearing as tolerated    Mobility  Bed Mobility Overal bed mobility: Needs Assistance Bed Mobility: Supine to Sit     Supine to sit: Mod assist     General bed mobility comments: ModA with supine to sit this afternoon, as pt was having difficulty following commands and demo increased confusion. Mod verbal, visual, and tactile cues to achieve task.   Transfers Overall transfer level: Needs assistance Equipment used: Rolling walker (2 wheeled) Transfers: Sit to/from Stand Sit to Stand: Mod assist         General transfer comment: Pt with modA for transfers, requiring increased verbal and tactile cues to B hips for forwards weightshift during STS, as pt has tendency to push posteriorly when standing.   Ambulation/Gait Ambulation/Gait assistance: Min assist Ambulation Distance (Feet): 20 Feet Assistive device: Rolling walker (2 wheeled) Gait Pattern/deviations: Decreased step length - right;Decreased step length - left;Trunk flexed     General Gait Details: Required max cues to perform and min physical assist and assist with maneuvering RW. Pt education re: to look up with amb and stay inside of RW; pt with tendency to run into objects and required max SPT assist for object manipulation.    Stairs            Wheelchair Mobility    Modified Rankin (Stroke Patients Only)       Balance  Cognition Arousal/Alertness: Awake/alert Behavior During Therapy: WFL for tasks assessed/performed Overall Cognitive Status: No family/caregiver present to determine baseline cognitive functioning                                 General Comments: Pt presented with more confusion than this  morning. Only able to follow 1 step commands ~30% of the time. Nursing notified.       Exercises Other Exercises Other Exercises: (P) supine therex performed to B LE's with supervision-MinA x10 reps: ankle pumps, quad sets, glute sets, SLR (minA), and hip abd (minA). Pt continuously attempting to get out of bed throughout therex; also with tendency to press buttons on bed and handle portable wound vac. Max cuing to regain pt attention. Seated reaches to focus on ant WS x10 reps with MinA for cuing.     General Comments        Pertinent Vitals/Pain Pain Assessment: Faces Faces Pain Scale: Hurts a little bit Pain Location: Left hip Pain Descriptors / Indicators: Sharp Pain Intervention(s): Limited activity within patient's tolerance;Monitored during session;Repositioned;Relaxation    Home Living                      Prior Function            PT Goals (current goals can now be found in the care plan section) Acute Rehab PT Goals Patient Stated Goal: Return to SNF to complete rehab and return to as high level of function as possible PT Goal Formulation: With patient Time For Goal Achievement: 12/09/16 Potential to Achieve Goals: Good Progress towards PT goals: Not progressing toward goals - comment (Increased confusion, limiting treatment)    Frequency    BID      PT Plan Current plan remains appropriate    Co-evaluation              AM-PAC PT "6 Clicks" Daily Activity  Outcome Measure  Difficulty turning over in bed (including adjusting bedclothes, sheets and blankets)?: Total Difficulty moving from lying on back to sitting on the side of the bed? : Total Difficulty sitting down on and standing up from a chair with arms (e.g., wheelchair, bedside commode, etc,.)?: Total Help needed moving to and from a bed to chair (including a wheelchair)?: A Lot Help needed walking in hospital room?: A Lot Help needed climbing 3-5 steps with a railing? : Total 6  Click Score: 8    End of Session Equipment Utilized During Treatment: Gait belt Activity Tolerance: Patient limited by pain;Treatment limited secondary to agitation;Other (comment) (confusion ) Patient left: in chair;with call bell/phone within reach;with chair alarm set;with SCD's reapplied Nurse Communication: Mobility status;Other (comment) (Pt confusion) PT Visit Diagnosis: History of falling (Z91.81);Other abnormalities of gait and mobility (R26.89);Pain;Muscle weakness (generalized) (M62.81) Pain - Right/Left: Left Pain - part of body: Hip     Time: 9379-0240 PT Time Calculation (min) (ACUTE ONLY): 33 min  Charges:                       G Codes:       Oran Rein PT, SPT   Bevelyn Ngo 12/02/2016, 4:24 PM

## 2016-12-03 DIAGNOSIS — I129 Hypertensive chronic kidney disease with stage 1 through stage 4 chronic kidney disease, or unspecified chronic kidney disease: Secondary | ICD-10-CM | POA: Diagnosis not present

## 2016-12-03 DIAGNOSIS — M25352 Other instability, left hip: Secondary | ICD-10-CM | POA: Diagnosis not present

## 2016-12-03 DIAGNOSIS — Z471 Aftercare following joint replacement surgery: Secondary | ICD-10-CM | POA: Diagnosis not present

## 2016-12-03 DIAGNOSIS — S73005A Unspecified dislocation of left hip, initial encounter: Secondary | ICD-10-CM | POA: Diagnosis not present

## 2016-12-03 DIAGNOSIS — M6281 Muscle weakness (generalized): Secondary | ICD-10-CM | POA: Diagnosis not present

## 2016-12-03 DIAGNOSIS — R269 Unspecified abnormalities of gait and mobility: Secondary | ICD-10-CM | POA: Diagnosis not present

## 2016-12-03 DIAGNOSIS — Z7401 Bed confinement status: Secondary | ICD-10-CM | POA: Diagnosis not present

## 2016-12-03 DIAGNOSIS — I10 Essential (primary) hypertension: Secondary | ICD-10-CM | POA: Diagnosis not present

## 2016-12-03 DIAGNOSIS — M25559 Pain in unspecified hip: Secondary | ICD-10-CM | POA: Diagnosis not present

## 2016-12-03 DIAGNOSIS — G479 Sleep disorder, unspecified: Secondary | ICD-10-CM | POA: Diagnosis not present

## 2016-12-03 DIAGNOSIS — Z96642 Presence of left artificial hip joint: Secondary | ICD-10-CM | POA: Diagnosis not present

## 2016-12-03 DIAGNOSIS — S73036D Other anterior dislocation of unspecified hip, subsequent encounter: Secondary | ICD-10-CM | POA: Diagnosis not present

## 2016-12-03 DIAGNOSIS — N183 Chronic kidney disease, stage 3 (moderate): Secondary | ICD-10-CM | POA: Diagnosis not present

## 2016-12-03 LAB — BPAM RBC
Blood Product Expiration Date: 201807292359
Blood Product Expiration Date: 201807292359
UNIT TYPE AND RH: 5100
UNIT TYPE AND RH: 5100

## 2016-12-03 LAB — BASIC METABOLIC PANEL
ANION GAP: 6 (ref 5–15)
BUN: 18 mg/dL (ref 6–20)
CHLORIDE: 102 mmol/L (ref 101–111)
CO2: 28 mmol/L (ref 22–32)
Calcium: 10 mg/dL (ref 8.9–10.3)
Creatinine, Ser: 1.05 mg/dL — ABNORMAL HIGH (ref 0.44–1.00)
GFR calc Af Amer: 51 mL/min — ABNORMAL LOW (ref >60)
GFR, EST NON AFRICAN AMERICAN: 44 mL/min — AB (ref >60)
GLUCOSE: 108 mg/dL — AB (ref 65–99)
Potassium: 4.3 mmol/L (ref 3.5–5.1)
Sodium: 136 mmol/L (ref 135–145)

## 2016-12-03 LAB — TYPE AND SCREEN
ABO/RH(D): O POS
ANTIBODY SCREEN: NEGATIVE
UNIT DIVISION: 0
UNIT DIVISION: 0

## 2016-12-03 LAB — CBC
HEMATOCRIT: 30.4 % — AB (ref 35.0–47.0)
HEMOGLOBIN: 10.2 g/dL — AB (ref 12.0–16.0)
MCH: 32.9 pg (ref 26.0–34.0)
MCHC: 33.7 g/dL (ref 32.0–36.0)
MCV: 97.9 fL (ref 80.0–100.0)
Platelets: 266 10*3/uL (ref 150–440)
RBC: 3.11 MIL/uL — ABNORMAL LOW (ref 3.80–5.20)
RDW: 13.9 % (ref 11.5–14.5)
WBC: 11.3 10*3/uL — ABNORMAL HIGH (ref 3.6–11.0)

## 2016-12-03 NOTE — Progress Notes (Signed)
EMS arrived to transport patient to [facility]. Report called to Butch Penny at facility previously.

## 2016-12-03 NOTE — Progress Notes (Signed)
Patient resting in bed. Pain meds given with relief. Dressing dry and intact. prevena wound vac in place. No complaints or signs of distress. Plan to discharge to SNF today. Continue to monitor.

## 2016-12-03 NOTE — Progress Notes (Signed)
Attempted to call report x2 to [facility], No answer. Will attempt again later.

## 2016-12-03 NOTE — Clinical Social Work Note (Signed)
Patient will discharge to [facility] via non emergent EMS today. The CSW attempted to contact her family and left voicemail with an update. The patient verbalized that her family is aware that she will discharge, today. The facility is aware, and the packet has been delivered to the chart, including all hard scripts. CSW will continue to follow pending any additional discharge needs.  Santiago Bumpers, MSW, Latanya Presser [phone]

## 2016-12-03 NOTE — Progress Notes (Signed)
Subjective: 2 Days Post-Op Procedure(s) (LRB): ANTERIOR HIP REVISION, REVISION TO TOTAL (Left) Patient reports pain as mild.   Patient is well, and has had no acute complaints or problems Denies any CP, SOB, ABD pain. We will continue therapy today.  Plan is to go Skilled nursing facility after hospital stay.  Objective: Vital signs in last 24 hours: Temp:  [98.4 F (36.9 C)-99.2 F (37.3 C)] 99 F (37.2 C) (07/14 0724) Pulse Rate:  [84-89] 88 (07/14 0724) Resp:  [17-20] 18 (07/14 0724) BP: (130-159)/(38-59) 130/38 (07/14 0724) SpO2:  [91 %-98 %] 95 % (07/14 0724)  Intake/Output from previous day: 07/13 0701 - 07/14 0700 In: 2357 [P.O.:840; I.V.:1517] Out: 100 [Urine:100] Intake/Output this shift: No intake/output data recorded.   Recent Labs  12/01/16 1507 12/03/16 0431  HGB 12.2 10.2*    Recent Labs  12/01/16 1507 12/03/16 0431  WBC 16.2* 11.3*  RBC 3.71* 3.11*  HCT 36.9 30.4*  PLT 324 266    Recent Labs  12/02/16 0959 12/03/16 0431  NA 132* 136  K 4.1 4.3  CL 100* 102  CO2 23 28  BUN 19 18  CREATININE 1.13* 1.05*  GLUCOSE 153* 108*  CALCIUM 9.7 10.0   No results for input(s): LABPT, INR in the last 72 hours.  EXAM General - Patient is Alert, Appropriate and Oriented Extremity - Neurovascular intact Sensation intact distally Intact pulses distally Dorsiflexion/Plantar flexion intact No cellulitis present Compartment soft Dressing - dressing C/D/I and no drainage Motor Function - intact, moving foot and toes well on exam.   Past Medical History:  Diagnosis Date  . Aphasia 04/2016   Transient---??TIA  . Asthma   . CKD (chronic kidney disease)   . Edema   . Femur fracture, left ([place]) 09/06/2016  . GERD (gastroesophageal reflux disease)   . Hyperlipidemia   . Hypertension   . Osteoporosis     Assessment/Plan:   2 Days Post-Op Procedure(s) (LRB): ANTERIOR HIP REVISION, REVISION TO TOTAL (Left) Active Problems:   Recurrent  dislocation, left hip  Estimated body mass index is 21.97 kg/m as calculated from the following:   Height as of this encounter: 5\' 4"  (1.626 m).   Weight as of this encounter: 58.1 kg (128 lb). Advance diet Up with therapy   Patient has had a BM. Labs reviewed this morning, WBC decreased to 11.3, Hg 10.2 this AM. Plan will be for discharge today to SNF.  Remove wound vac on 12/09/16, apply honey comb dressing. Keep incision clean and dry TED hose BLE x 6 weeks, remove at night time Follow up with [facility] in 2 weeks for staple removal  DVT Prophylaxis - Lovenox, Foot Pumps and TED hose Weight-Bearing as tolerated to left leg  J. Cameron Proud, PA-C [place] 12/03/2016, 8:31 AM

## 2016-12-03 NOTE — Progress Notes (Signed)
Physical Therapy Treatment Patient Details Name: Jacqueline Sanchez MRN: [id number] DOB: 03-Nov-1919 Today's Date: 12/03/2016    History of Present Illness Patient is a 83 y.o. female presents with left hip instability. Patient suffered a left femoral neck fracture 09/06/2016 and then had a left hip hemiarthroplasty by Dr. Mack Guise on 4/18.  She underwent rehabilitation and unfortunately on 6/13 suffered a posterior dislocation and subsequent closed reduction followed by a second dislocation on 7/4. Initial procedure was done through posterior approach and she has had posterior dislocations. Pt has now undergone a L anterior approach total hip to replace her posterior hemiarthroplasty. Pt reports significant pain at time of PT evaluation. She refuses to attempt transfers or ambulation but agrees to bed exercises.     PT Comments    Participated in exercises as described below.  During tx, pt stated she had a suppository earlier and BM was noted.  Transferred to bedside commode with mod a x 1 for continued BM.  CNA in and assisted with care.  She required min/mod a x 1 to maintain balance for tasks and requires +2 for care to ensure safety.  She was then able to ambulate around bed with min a x 1.  Limited by fatigue.      Follow Up Recommendations  SNF     Equipment Recommendations  None recommended by PT    Recommendations for Other Services       Precautions / Restrictions Precautions Precautions: Anterior Hip Precaution Booklet Issued: Yes (comment) Precaution Comments: Left Hip Posterior Precautions Restrictions Weight Bearing Restrictions: Yes LLE Weight Bearing: Weight bearing as tolerated    Mobility  Bed Mobility Overal bed mobility: Needs Assistance Bed Mobility: Supine to Sit;Sit to Supine     Supine to sit: Mod assist Sit to supine: Mod assist      Transfers Overall transfer level: Needs assistance Equipment used: Rolling walker (2 wheeled) Transfers: Sit  to/from Stand Sit to Stand: Mod assist            Ambulation/Gait Ambulation/Gait assistance: Mod assist;Min assist Ambulation Distance (Feet): 20 Feet Assistive device: Rolling walker (2 wheeled) Gait Pattern/deviations: Step-through pattern;Decreased step length - right;Decreased step length - left         Stairs            Wheelchair Mobility    Modified Rankin (Stroke Patients Only)       Balance Overall balance assessment: Needs assistance Sitting-balance support: Bilateral upper extremity supported;Feet supported Sitting balance-Leahy Scale: Good     Standing balance support: Bilateral upper extremity supported Standing balance-Leahy Scale: Fair                              Cognition Arousal/Alertness: Awake/alert Behavior During Therapy: WFL for tasks assessed/performed Overall Cognitive Status: Within Functional Limits for tasks assessed                                        Exercises Total Joint Exercises Ankle Circles/Pumps: AROM;Both;10 reps;Supine Quad Sets: Strengthening;Both;10 reps;Supine Gluteal Sets: Strengthening;Both;10 reps;Supine Heel Slides: Strengthening;Left;10 reps;Supine Hip ABduction/ADduction: Strengthening;Left;10 reps;Supine Other Exercises Other Exercises: Incontinent of BM during exercises. To bedside commode with mod a x 1 for continued BM.  Assistance from CNA for care.    General Comments        Pertinent Vitals/Pain Pain Assessment: 0-10 Pain Score:  5  Pain Location: Left hip Pain Descriptors / Indicators: Sore Pain Intervention(s): Limited activity within patient's tolerance    Home Living                      Prior Function            PT Goals (current goals can now be found in the care plan section) Progress towards PT goals: Progressing toward goals    Frequency    BID      PT Plan Current plan remains appropriate    Co-evaluation               AM-PAC PT "6 Clicks" Daily Activity  Outcome Measure  Difficulty turning over in bed (including adjusting bedclothes, sheets and blankets)?: Total Difficulty moving from lying on back to sitting on the side of the bed? : Total Difficulty sitting down on and standing up from a chair with arms (e.g., wheelchair, bedside commode, etc,.)?: Total Help needed moving to and from a bed to chair (including a wheelchair)?: A Lot Help needed walking in hospital room?: A Lot Help needed climbing 3-5 steps with a railing? : Total 6 Click Score: 8    End of Session Equipment Utilized During Treatment: Gait belt Activity Tolerance: Patient limited by fatigue;Patient tolerated treatment well Patient left: in bed;with bed alarm set;with call bell/phone within reach   Pain - Right/Left: Left Pain - part of body: Hip     Time: 4174-0814 PT Time Calculation (min) (ACUTE ONLY): 31 min  Charges:  $Gait Training: 8-22 mins $Therapeutic Exercise: 8-22 mins                    G Codes:      Chesley Noon, PTA 12/03/16, 10:01 AM

## 2016-12-03 NOTE — Discharge Instructions (Signed)

## 2016-12-05 DIAGNOSIS — S73005A Unspecified dislocation of left hip, initial encounter: Secondary | ICD-10-CM | POA: Diagnosis not present

## 2016-12-05 DIAGNOSIS — N183 Chronic kidney disease, stage 3 (moderate): Secondary | ICD-10-CM | POA: Diagnosis not present

## 2016-12-05 DIAGNOSIS — I10 Essential (primary) hypertension: Secondary | ICD-10-CM | POA: Diagnosis not present

## 2016-12-05 DIAGNOSIS — G479 Sleep disorder, unspecified: Secondary | ICD-10-CM | POA: Diagnosis not present

## 2016-12-26 DIAGNOSIS — R2681 Unsteadiness on feet: Secondary | ICD-10-CM | POA: Diagnosis not present

## 2016-12-26 DIAGNOSIS — R262 Difficulty in walking, not elsewhere classified: Secondary | ICD-10-CM | POA: Diagnosis not present

## 2016-12-26 DIAGNOSIS — R296 Repeated falls: Secondary | ICD-10-CM | POA: Diagnosis not present

## 2016-12-28 DIAGNOSIS — R262 Difficulty in walking, not elsewhere classified: Secondary | ICD-10-CM | POA: Diagnosis not present

## 2016-12-28 DIAGNOSIS — R296 Repeated falls: Secondary | ICD-10-CM | POA: Diagnosis not present

## 2016-12-28 DIAGNOSIS — R2681 Unsteadiness on feet: Secondary | ICD-10-CM | POA: Diagnosis not present

## 2016-12-29 DIAGNOSIS — R262 Difficulty in walking, not elsewhere classified: Secondary | ICD-10-CM | POA: Diagnosis not present

## 2016-12-29 DIAGNOSIS — R2681 Unsteadiness on feet: Secondary | ICD-10-CM | POA: Diagnosis not present

## 2016-12-29 DIAGNOSIS — R296 Repeated falls: Secondary | ICD-10-CM | POA: Diagnosis not present

## 2016-12-30 DIAGNOSIS — R262 Difficulty in walking, not elsewhere classified: Secondary | ICD-10-CM | POA: Diagnosis not present

## 2016-12-30 DIAGNOSIS — R2681 Unsteadiness on feet: Secondary | ICD-10-CM | POA: Diagnosis not present

## 2016-12-30 DIAGNOSIS — R296 Repeated falls: Secondary | ICD-10-CM | POA: Diagnosis not present

## 2017-01-02 DIAGNOSIS — R2681 Unsteadiness on feet: Secondary | ICD-10-CM | POA: Diagnosis not present

## 2017-01-02 DIAGNOSIS — R262 Difficulty in walking, not elsewhere classified: Secondary | ICD-10-CM | POA: Diagnosis not present

## 2017-01-02 DIAGNOSIS — R296 Repeated falls: Secondary | ICD-10-CM | POA: Diagnosis not present

## 2017-01-05 DIAGNOSIS — R296 Repeated falls: Secondary | ICD-10-CM | POA: Diagnosis not present

## 2017-01-05 DIAGNOSIS — R262 Difficulty in walking, not elsewhere classified: Secondary | ICD-10-CM | POA: Diagnosis not present

## 2017-01-05 DIAGNOSIS — R2681 Unsteadiness on feet: Secondary | ICD-10-CM | POA: Diagnosis not present

## 2017-01-06 DIAGNOSIS — R2681 Unsteadiness on feet: Secondary | ICD-10-CM | POA: Diagnosis not present

## 2017-01-06 DIAGNOSIS — R296 Repeated falls: Secondary | ICD-10-CM | POA: Diagnosis not present

## 2017-01-06 DIAGNOSIS — R262 Difficulty in walking, not elsewhere classified: Secondary | ICD-10-CM | POA: Diagnosis not present

## 2017-01-10 DIAGNOSIS — R2681 Unsteadiness on feet: Secondary | ICD-10-CM | POA: Diagnosis not present

## 2017-01-10 DIAGNOSIS — R296 Repeated falls: Secondary | ICD-10-CM | POA: Diagnosis not present

## 2017-01-10 DIAGNOSIS — R262 Difficulty in walking, not elsewhere classified: Secondary | ICD-10-CM | POA: Diagnosis not present

## 2017-01-13 DIAGNOSIS — R296 Repeated falls: Secondary | ICD-10-CM | POA: Diagnosis not present

## 2017-01-13 DIAGNOSIS — R2681 Unsteadiness on feet: Secondary | ICD-10-CM | POA: Diagnosis not present

## 2017-01-13 DIAGNOSIS — Z96642 Presence of left artificial hip joint: Secondary | ICD-10-CM | POA: Diagnosis not present

## 2017-01-13 DIAGNOSIS — R262 Difficulty in walking, not elsewhere classified: Secondary | ICD-10-CM | POA: Diagnosis not present

## 2017-01-17 DIAGNOSIS — R296 Repeated falls: Secondary | ICD-10-CM | POA: Diagnosis not present

## 2017-01-17 DIAGNOSIS — R262 Difficulty in walking, not elsewhere classified: Secondary | ICD-10-CM | POA: Diagnosis not present

## 2017-01-17 DIAGNOSIS — R2681 Unsteadiness on feet: Secondary | ICD-10-CM | POA: Diagnosis not present

## 2017-01-18 ENCOUNTER — Non-Acute Institutional Stay: Payer: Medicare Other | Admitting: Internal Medicine

## 2017-01-18 ENCOUNTER — Encounter: Payer: Self-pay | Admitting: Internal Medicine

## 2017-01-18 VITALS — BP 138/76 | HR 70 | Wt 130.0 lb

## 2017-01-18 DIAGNOSIS — M150 Primary generalized (osteo)arthritis: Secondary | ICD-10-CM | POA: Diagnosis not present

## 2017-01-18 DIAGNOSIS — N183 Chronic kidney disease, stage 3 unspecified: Secondary | ICD-10-CM

## 2017-01-18 DIAGNOSIS — E7800 Pure hypercholesterolemia, unspecified: Secondary | ICD-10-CM | POA: Diagnosis not present

## 2017-01-18 DIAGNOSIS — I10 Essential (primary) hypertension: Secondary | ICD-10-CM | POA: Diagnosis not present

## 2017-01-18 DIAGNOSIS — G479 Sleep disorder, unspecified: Secondary | ICD-10-CM | POA: Diagnosis not present

## 2017-01-18 DIAGNOSIS — M159 Polyosteoarthritis, unspecified: Secondary | ICD-10-CM

## 2017-01-18 NOTE — Progress Notes (Signed)
Subjective:    Patient ID: Jacqueline Sanchez, female    DOB: [birth year], 83 y.o.   MRN: [id number]  HPI  Routine follow up in [address]. Reviewed status with RN. Recent hospitalization for ORIF left hip s/p dislocation. She has been working with PT. She has been walking on her own with rolling walker. Pain managed with Percocet and Oxycodone. Otherwise, no new concerns from RN or resident. Sleeps well. Independent with ADL's. She reports occasional constipation, controlled with meds. She denies urinary incontinence. She denies pain, shortness of breath or reflux.  CKD 3: Creat 1.05. She is on an ARB.  HTN: Her BP is controlled on Metoprolol, Hydralazine, Losartan, and Amlodipine.  OA: Multiple sites. Her pain is controlled with Tylenol and prn Oxycodone, Percocet.  HLD: She is taking Lipitor as prescribed. She denies myalgias. She consumes a low fat diet.  Sleep Disturbance: She sleeps well with Melatonin as long as she is not having a lot about of pain.  Review of Systems      Past Medical History:  Diagnosis Date  . Aphasia 04/2016   Transient---??TIA  . Asthma   . CKD (chronic kidney disease)   . Edema   . Femur fracture, left ([place]) 09/06/2016  . GERD (gastroesophageal reflux disease)   . Hyperlipidemia   . Hypertension   . Osteoporosis     Current Outpatient Prescriptions  Medication Sig Dispense Refill  . acetaminophen (TYLENOL) 325 MG tablet Take 2 tablets (650 mg total) by mouth every 6 (six) hours as needed for mild pain.    Marland Kitchen amLODipine (NORVASC) 10 MG tablet Take 1 tablet (10 mg total) by mouth daily. (Patient taking differently: Take 10 mg by mouth every morning. ) 30 tablet 0  . aspirin EC 81 MG tablet Take 81 mg by mouth every other day.    Marland Kitchen atorvastatin (LIPITOR) 80 MG tablet Take 1 tablet by mouth every evening.     . enoxaparin (LOVENOX) 40 MG/0.4ML injection Inject 0.4 mLs (40 mg total) into the skin daily. 14 Syringe 0  . ferrous sulfate 325 (65 FE) MG  tablet Take 325 mg by mouth daily with breakfast.    . hydrALAZINE (APRESOLINE) 25 MG tablet Take 1 tablet (25 mg total) by mouth 4 (four) times daily. (Patient taking differently: Take 25 mg by mouth 4 (four) times daily. 8,12,4,8) 120 tablet 1  . losartan (COZAAR) 50 MG tablet Take 1 tablet by mouth every evening.     . Melatonin 3 MG TABS Take 1 tablet by mouth at bedtime.    . metoprolol succinate (TOPROL-XL) 50 MG 24 hr tablet Take 1 tablet (50 mg total) by mouth daily. Take with or immediately following a meal. (Patient taking differently: Take 50 mg by mouth every morning. Take with or immediately following a meal.) 30 tablet 1  . Multiple Vitamin (MULTI-VITAMINS) TABS Take 1 tablet by mouth daily.    Marland Kitchen oxyCODONE (OXY IR/ROXICODONE) 5 MG immediate release tablet Take 1-2 tablets (5-10 mg total) by mouth every 4 (four) hours as needed for breakthrough pain. 30 tablet 0  . oxycodone-acetaminophen (PERCOCET) 2.5-325 MG tablet Take 1 tablet by mouth every 6 (six) hours as needed for pain. FOR SEVERE PAIN    . polyethylene glycol (MIRALAX / GLYCOLAX) packet Take 17 g by mouth daily.    Marland Kitchen senna (SENOKOT) 8.6 MG tablet Take 2 tablets by mouth 2 (two) times daily.     . vitamin B-12 (CYANOCOBALAMIN) 1000 MCG tablet Take 1,000  mcg by mouth daily.    . Vitamin D, Cholecalciferol, 1000 units CAPS Take 1 capsule by mouth daily.     No current facility-administered medications for this visit.     Allergies  Allergen Reactions  . Morphine And Related Nausea Only    Family History  Problem Relation Age of Onset  . Hypertension Other     Social History   Social History  . Marital status: Widowed    Spouse name: N/A  . Number of children: 2  . Years of education: N/A   Occupational History  . 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 Smoking status: Never Smoker  . Smokeless tobacco: Never Used  . Alcohol use No  . Drug use: Unknown  . Sexual activity: No    Other Topics Concern  . Not on file   Social History Narrative   Widowed 2000   Has living will   Sons/DILs are health care POAs (mostly Mike/Jan)   Has DNR   No tube feeds if cognitively unaware     Constitutional: Denies fever, malaise, fatigue, headache or abrupt weight changes.  HEENT: Denies eye pain, eye redness, ear pain, ringing in the ears, wax buildup, runny nose, nasal congestion, bloody nose, or sore throat. Respiratory: Denies difficulty breathing, shortness of breath, cough or sputum production.   Cardiovascular: Denies chest pain, chest tightness, palpitations or swelling in the hands or feet.  Gastrointestinal: Pt reports constipation. Denies abdominal pain, bloating, diarrhea or blood in the stool.  GU: Denies urgency, frequency, pain with urination, burning sensation, blood in urine, odor or discharge. Musculoskeletal: Pt reports joint pain. Denies decrease in range of motion, difficulty with gait, muscle pain swelling.  Skin: Denies redness, rashes, lesions or ulcercations.  Neurological: Denies dizziness, difficulty with memory, difficulty with speech or problems with balance and coordination.  Psych: Denies anxiety, depression, SI/HI.  No other specific complaints in a complete review of systems (except as listed in HPI above).  Objective:   Physical Exam  BP 138/76   Pulse 70   Wt 130 lb (59 kg)   BMI 22.31 kg/m  Wt Readings from Last 3 Encounters:  01/18/17 130 lb (59 kg)  12/01/16 128 lb (58.1 kg)  11/23/16 128 lb 3.2 oz (58.2 kg)    General: Appears her stated age, in NAD. Cardiovascular: Normal rate and rhythm. S1,S2 noted.  Murmur noted. No JVD or BLE edema.  Pulmonary/Chest: Normal effort and positive vesicular breath sounds. No respiratory distress. No wheezes, rales or ronchi noted.  Abdomen: Soft and nontender. Normal bowel sounds.  Musculoskeletal: Gait slow but steady with use of rolling walker.  Neurological: Alert and oriented.     BMET    Component Value Date/Time   NA 136 12/03/2016 0431   NA 140 05/30/2011 1101   K 4.3 12/03/2016 0431   K 4.3 05/30/2011 1101   CL 102 12/03/2016 0431   CL 99 05/30/2011 1101   CO2 28 12/03/2016 0431   CO2 30 05/30/2011 1101   GLUCOSE 108 (H) 12/03/2016 0431   GLUCOSE 101 (H) 05/30/2011 1101   BUN 18 12/03/2016 0431   BUN 20 (H) 05/30/2011 1101   CREATININE 1.05 (H) 12/03/2016 0431   CREATININE 1.69 (H) 11/18/2011 1535   CALCIUM 10.0 12/03/2016 0431   CALCIUM 11.0 (H) 05/30/2011 1101   GFRNONAA 44 (L) 12/03/2016 0431   GFRNONAA 26 (L) 11/18/2011 1535   GFRAA 51 (L) 12/03/2016 0431  GFRAA 31 (L) 11/18/2011 1535    Lipid Panel  No results found for: CHOL, TRIG, HDL, CHOLHDL, VLDL, LDLCALC  CBC    Component Value Date/Time   WBC 11.3 (H) 12/03/2016 0431   RBC 3.11 (L) 12/03/2016 0431   HGB 10.2 (L) 12/03/2016 0431   HGB 12.7 05/30/2011 1101   HCT 30.4 (L) 12/03/2016 0431   HCT 37.8 05/30/2011 1101   PLT 266 12/03/2016 0431   PLT 237 05/30/2011 1101   MCV 97.9 12/03/2016 0431   MCV 101 (H) 05/30/2011 1101   MCH 32.9 12/03/2016 0431   MCHC 33.7 12/03/2016 0431   RDW 13.9 12/03/2016 0431   RDW 13.5 05/30/2011 1101   LYMPHSABS 0.7 (L) 09/06/2016 2338   MONOABS 1.0 (H) 09/06/2016 2338   EOSABS 0.0 09/06/2016 2338   BASOSABS 0.0 09/06/2016 2338    Hgb A1C No results found for: HGBA1C          Assessment & Plan:   CKD 3:  Continue ARB Will monitor creatinine  HTN:  Controlled on multiple meds Will monitor  HLD:  Yearly lipid profile Continue statin therapy  Sleep Disturbance:  Continue Melatonin  OA:  Continue Tylenol I had hoped to wean back on her Percocet or Oxycodone, she reports she has been on these meds for years and would not like to wean anything at this time  Will reassess as needed Webb Silversmith, NP

## 2017-01-20 DIAGNOSIS — R262 Difficulty in walking, not elsewhere classified: Secondary | ICD-10-CM | POA: Diagnosis not present

## 2017-01-20 DIAGNOSIS — R296 Repeated falls: Secondary | ICD-10-CM | POA: Diagnosis not present

## 2017-01-20 DIAGNOSIS — R2681 Unsteadiness on feet: Secondary | ICD-10-CM | POA: Diagnosis not present

## 2017-01-24 DIAGNOSIS — R296 Repeated falls: Secondary | ICD-10-CM | POA: Diagnosis not present

## 2017-01-24 DIAGNOSIS — R262 Difficulty in walking, not elsewhere classified: Secondary | ICD-10-CM | POA: Diagnosis not present

## 2017-01-27 DIAGNOSIS — R296 Repeated falls: Secondary | ICD-10-CM | POA: Diagnosis not present

## 2017-01-27 DIAGNOSIS — R262 Difficulty in walking, not elsewhere classified: Secondary | ICD-10-CM | POA: Diagnosis not present

## 2017-01-31 DIAGNOSIS — R262 Difficulty in walking, not elsewhere classified: Secondary | ICD-10-CM | POA: Diagnosis not present

## 2017-01-31 DIAGNOSIS — R296 Repeated falls: Secondary | ICD-10-CM | POA: Diagnosis not present

## 2017-02-01 ENCOUNTER — Ambulatory Visit: Payer: Medicare Other | Admitting: Internal Medicine

## 2017-02-01 ENCOUNTER — Encounter: Payer: Self-pay | Admitting: Internal Medicine

## 2017-02-01 VITALS — BP 124/76 | HR 80

## 2017-02-01 DIAGNOSIS — B351 Tinea unguium: Secondary | ICD-10-CM | POA: Diagnosis not present

## 2017-02-01 NOTE — Patient Instructions (Signed)

## 2017-02-01 NOTE — Progress Notes (Signed)
Subjective:    Patient ID: Jacqueline Sanchez, female    DOB: 11/06/1919, 83 y.o.   MRN: [id number]  HPI  Asked to see pt in [address]. Wanting toenails trimmed. She reports they are thick and long, getting caught on socks causing pain.  Review of Systems      Past Medical History:  Diagnosis Date  . Aphasia 04/2016   Transient---??TIA  . Asthma   . CKD (chronic kidney disease)   . Edema   . Femur fracture, left ([place]) 09/06/2016  . GERD (gastroesophageal reflux disease)   . Hyperlipidemia   . Hypertension   . Osteoporosis     Current Outpatient Prescriptions  Medication Sig Dispense Refill  . acetaminophen (TYLENOL) 325 MG tablet Take 2 tablets (650 mg total) by mouth every 6 (six) hours as needed for mild pain.    Marland Kitchen amLODipine (NORVASC) 10 MG tablet Take 1 tablet (10 mg total) by mouth daily. (Patient taking differently: Take 10 mg by mouth every morning. ) 30 tablet 0  . aspirin EC 81 MG tablet Take 81 mg by mouth every other day.    Marland Kitchen atorvastatin (LIPITOR) 80 MG tablet Take 1 tablet by mouth every evening.     . enoxaparin (LOVENOX) 40 MG/0.4ML injection Inject 0.4 mLs (40 mg total) into the skin daily. 14 Syringe 0  . ferrous sulfate 325 (65 FE) MG tablet Take 325 mg by mouth daily with breakfast.    . hydrALAZINE (APRESOLINE) 25 MG tablet Take 1 tablet (25 mg total) by mouth 4 (four) times daily. (Patient taking differently: Take 25 mg by mouth 4 (four) times daily. 8,12,4,8) 120 tablet 1  . losartan (COZAAR) 50 MG tablet Take 1 tablet by mouth every evening.     . Melatonin 3 MG TABS Take 1 tablet by mouth at bedtime.    . metoprolol succinate (TOPROL-XL) 50 MG 24 hr tablet Take 1 tablet (50 mg total) by mouth daily. Take with or immediately following a meal. (Patient taking differently: Take 50 mg by mouth every morning. Take with or immediately following a meal.) 30 tablet 1  . Multiple Vitamin (MULTI-VITAMINS) TABS Take 1 tablet by mouth daily.    Marland Kitchen oxyCODONE (OXY  IR/ROXICODONE) 5 MG immediate release tablet Take 1-2 tablets (5-10 mg total) by mouth every 4 (four) hours as needed for breakthrough pain. 30 tablet 0  . oxycodone-acetaminophen (PERCOCET) 2.5-325 MG tablet Take 1 tablet by mouth every 6 (six) hours as needed for pain. FOR SEVERE PAIN    . polyethylene glycol (MIRALAX / GLYCOLAX) packet Take 17 g by mouth daily.    Marland Kitchen senna (SENOKOT) 8.6 MG tablet Take 2 tablets by mouth 2 (two) times daily.     . vitamin B-12 (CYANOCOBALAMIN) 1000 MCG tablet Take 1,000 mcg by mouth daily.    . Vitamin D, Cholecalciferol, 1000 units CAPS Take 1 capsule by mouth daily.     No current facility-administered medications for this visit.     Allergies  Allergen Reactions  . Morphine And Related Nausea Only    Family History  Problem Relation Age of Onset  . Hypertension Other     Social History   Social History  . Marital status: Widowed    Spouse name: N/A  . Number of children: 2  . Years of education: N/A   Occupational History  . 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 Smoking status: Never Smoker  . Smokeless tobacco:  Never Used  . Alcohol use No  . Drug use: Unknown  . Sexual activity: No   Other Topics Concern  . Not on file   Social History Narrative   Widowed 2000   Has living will   Sons/DILs are health care POAs (mostly Mike/Jan)   Has DNR   No tube feeds if cognitively unaware     Constitutional: Denies fever, malaise, fatigue, headache or abrupt weight changes.  Skin: Pt reports thick, long toenails.  No other specific complaints in a complete review of systems (except as listed in HPI above).  Objective:   Physical Exam   BP 124/76   Pulse 80    Wt Readings from Last 3 Encounters:  01/18/17 130 lb (59 kg)  12/01/16 128 lb (58.1 kg)  11/23/16 128 lb 3.2 oz (58.2 kg)    General: Appears her stated age,  in NAD. Skin: Mycotic toenail bilaterally.  BMET    Component Value Date/Time     NA 136 12/03/2016 0431   NA 140 05/30/2011 1101   K 4.3 12/03/2016 0431   K 4.3 05/30/2011 1101   CL 102 12/03/2016 0431   CL 99 05/30/2011 1101   CO2 28 12/03/2016 0431   CO2 30 05/30/2011 1101   GLUCOSE 108 (H) 12/03/2016 0431   GLUCOSE 101 (H) 05/30/2011 1101   BUN 18 12/03/2016 0431   BUN 20 (H) 05/30/2011 1101   CREATININE 1.05 (H) 12/03/2016 0431   CREATININE 1.69 (H) 11/18/2011 1535   CALCIUM 10.0 12/03/2016 0431   CALCIUM 11.0 (H) 05/30/2011 1101   GFRNONAA 44 (L) 12/03/2016 0431   GFRNONAA 26 (L) 11/18/2011 1535   GFRAA 51 (L) 12/03/2016 0431   GFRAA 31 (L) 11/18/2011 1535    Lipid Panel  No results found for: CHOL, TRIG, HDL, CHOLHDL, VLDL, LDLCALC  CBC    Component Value Date/Time   WBC 11.3 (H) 12/03/2016 0431   RBC 3.11 (L) 12/03/2016 0431   HGB 10.2 (L) 12/03/2016 0431   HGB 12.7 05/30/2011 1101   HCT 30.4 (L) 12/03/2016 0431   HCT 37.8 05/30/2011 1101   PLT 266 12/03/2016 0431   PLT 237 05/30/2011 1101   MCV 97.9 12/03/2016 0431   MCV 101 (H) 05/30/2011 1101   MCH 32.9 12/03/2016 0431   MCHC 33.7 12/03/2016 0431   RDW 13.9 12/03/2016 0431   RDW 13.5 05/30/2011 1101   LYMPHSABS 0.7 (L) 09/06/2016 2338   MONOABS 1.0 (H) 09/06/2016 2338   EOSABS 0.0 09/06/2016 2338   BASOSABS 0.0 09/06/2016 2338    Hgb A1C No results found for: HGBA1C         Assessment & Plan:   Mycotic Toenails:  Trimmed by provider using dremmel tool  Will follow up as needed Webb Silversmith, NP

## 2017-02-02 DIAGNOSIS — R296 Repeated falls: Secondary | ICD-10-CM | POA: Diagnosis not present

## 2017-02-02 DIAGNOSIS — R262 Difficulty in walking, not elsewhere classified: Secondary | ICD-10-CM | POA: Diagnosis not present

## 2017-04-20 ENCOUNTER — Ambulatory Visit: Payer: Medicare Other | Admitting: Internal Medicine

## 2017-04-20 ENCOUNTER — Encounter: Payer: Self-pay | Admitting: Internal Medicine

## 2017-04-20 VITALS — BP 102/56 | HR 78 | Temp 98.5°F | Resp 14 | Wt 126.0 lb

## 2017-04-20 DIAGNOSIS — F1120 Opioid dependence, uncomplicated: Secondary | ICD-10-CM | POA: Diagnosis not present

## 2017-04-20 DIAGNOSIS — N183 Chronic kidney disease, stage 3 unspecified: Secondary | ICD-10-CM

## 2017-04-20 DIAGNOSIS — I10 Essential (primary) hypertension: Secondary | ICD-10-CM

## 2017-04-20 DIAGNOSIS — I38 Endocarditis, valve unspecified: Secondary | ICD-10-CM | POA: Diagnosis not present

## 2017-04-20 DIAGNOSIS — M159 Polyosteoarthritis, unspecified: Secondary | ICD-10-CM | POA: Diagnosis not present

## 2017-04-20 NOTE — Assessment & Plan Note (Signed)
Now only taking at bedtime and prn meds are controlled by staff--no risk for diversion

## 2017-04-20 NOTE — Assessment & Plan Note (Signed)
Stable on losartan 

## 2017-04-20 NOTE — Assessment & Plan Note (Signed)
Mild aortic and mitral disease No CHF and no symptoms

## 2017-04-20 NOTE — Assessment & Plan Note (Signed)
Left hip better Functionally doing well Multiple other pain areas--but narcotic need has decreased

## 2017-04-20 NOTE — Progress Notes (Signed)
Subjective:    Patient ID: Jacqueline Sanchez, female    DOB: 1920/02/10, 83 y.o.   MRN: [id number]  HPI Visit to her room for review of chronic health conditions Reviewed status with [name] RN  Has recovered well from the left THR Needed repeated procedures for dislocation Now doing well Takes the percocet bid and prn Now with more pain again in left shoulder Sometimes gets rib pain as well  No chest pain No SOB No neurologic symptoms noted No dizziness or syncope No edema  Walks with cane Sometimes walks without in her apt Has aide to help with showers three times a week  Current Outpatient Medications on File Prior to Visit  Medication Sig Dispense Refill  . acetaminophen (TYLENOL) 325 MG tablet Take 2 tablets (650 mg total) by mouth every 6 (six) hours as needed for mild pain.    Marland Kitchen amLODipine (NORVASC) 10 MG tablet Take 1 tablet (10 mg total) by mouth daily. (Patient taking differently: Take 10 mg by mouth every morning. ) 30 tablet 0  . aspirin EC 81 MG tablet Take 81 mg by mouth every other day.    Marland Kitchen atorvastatin (LIPITOR) 80 MG tablet Take 1 tablet by mouth every evening.     . ferrous sulfate 325 (65 FE) MG tablet Take 325 mg by mouth daily with breakfast.    . hydrALAZINE (APRESOLINE) 25 MG tablet Take 1 tablet (25 mg total) by mouth 4 (four) times daily. (Patient taking differently: Take 25 mg by mouth 3 (three) times daily. ) 120 tablet 1  . losartan (COZAAR) 50 MG tablet Take 1 tablet by mouth every evening.     . Melatonin 3 MG TABS Take 1 tablet by mouth at bedtime.    . metoprolol succinate (TOPROL-XL) 50 MG 24 hr tablet Take 1 tablet (50 mg total) by mouth daily. Take with or immediately following a meal. (Patient taking differently: Take 50 mg by mouth every morning. Take with or immediately following a meal.) 30 tablet 1  . Multiple Vitamin (MULTI-VITAMINS) TABS Take 1 tablet by mouth daily.    Marland Kitchen oxyCODONE-acetaminophen (PERCOCET/ROXICET) 5-325 MG tablet Take 1  tablet by mouth daily. And 2 every 6 hours prn severe pain    . polyethylene glycol (MIRALAX / GLYCOLAX) packet Take 17 g by mouth daily.    Marland Kitchen senna (SENOKOT) 8.6 MG tablet Take 2 tablets by mouth 2 (two) times daily.     . vitamin B-12 (CYANOCOBALAMIN) 1000 MCG tablet Take 1,000 mcg by mouth daily.    . Vitamin D, Cholecalciferol, 1000 units CAPS Take 1 capsule by mouth daily.     No current facility-administered medications on file prior to visit.     Allergies  Allergen Reactions  . Morphine And Related Nausea Only    Past Medical History:  Diagnosis Date  . Aphasia 04/2016   Transient---??TIA  . Asthma   . CKD (chronic kidney disease)   . Edema   . Femur fracture, left ([place]) 09/06/2016  . GERD (gastroesophageal reflux disease)   . Hyperlipidemia   . Hypertension   . Osteoporosis     Past Surgical History:  Procedure Laterality Date  . ANTERIOR HIP REVISION Left 12/01/2016   Procedure: ANTERIOR HIP REVISION, REVISION TO TOTAL;  Surgeon: Hessie Knows, MD;  Location: [facility] ORS;  Service: Orthopedics;  Laterality: Left;  . HIP ARTHROPLASTY Left 09/07/2016   Procedure: ARTHROPLASTY BIPOLAR HIP (HEMIARTHROPLASTY);  Surgeon: Thornton Park, MD;  Location: [facility] OR[facility];  Service: Orthopedics;  Laterality: Left;  . HIP CLOSED REDUCTION Left 11/03/2016   Procedure: CLOSED REDUCTION HIP;  Surgeon: Thornton Park, MD;  Location: [facility] OR[facility];  Service: Orthopedics;  Laterality: Left;  . HIP CLOSED REDUCTION Left 11/24/2016   Procedure: CLOSED REDUCTION HIP;  Surgeon: Thornton Park, MD;  Location: [facility] OR[facility];  Service: Orthopedics;  Laterality: Left;  . KYPHOPLASTY      Family History  Problem Relation Age of Onset  . Hypertension Other     Social History   Socioeconomic History  . Marital status: Widowed    Spouse name: Not on file  . Number of children: 2  . Years of education: Not on file  . Highest education level: Not on file  Social Needs  . Financial resource strain: Not  on file  . Food insecurity - worry: Not on file  . Food insecurity - inability: Not on file  . Transportation needs - medical: Not on file  . Transportation needs - non-medical: Not on file  Occupational History  . Occupation: [employer]    Comment: Retired  Tobacco Use  . Smoking status: Never Smoker  . Smokeless tobacco: Never Used  Substance and Sexual Activity  . Alcohol use: No  . Drug use: Not on file  . Sexual activity: No  Other Topics Concern  . Not on file  Social History Narrative   Widowed 2000   Has living will   Sons/DILs are health care POAs (mostly Mike/Jan)   Has DNR   No tube feeds if cognitively unaware   Review of Systems  Appetite is better Has gained back some weight Sleeping well Bowels are moving well    Objective:   Physical Exam  Constitutional: She appears well-developed. No distress.  Neck: No thyromegaly present.  Cardiovascular: Normal rate and regular rhythm. Exam reveals no gallop.  Gr 3/6 systolic murmur  Pulmonary/Chest: Effort normal and breath sounds normal. No respiratory distress. She has no wheezes. She has no rales.  Abdominal: Soft. She exhibits no distension. There is no tenderness.  Musculoskeletal: She exhibits no edema.  Lymphadenopathy:    She has no cervical adenopathy.  Neurological:  Walks stably with cane  Psychiatric: She has a normal mood and affect. Her behavior is normal.          Assessment & Plan:

## 2017-04-20 NOTE — Assessment & Plan Note (Signed)
BP Readings from Last 3 Encounters:  04/20/17 (!) 102/56  02/01/17 124/76  01/18/17 138/76   Good control No changes needed Labs okay

## 2017-05-03 ENCOUNTER — Ambulatory Visit: Payer: Medicare Other | Admitting: Internal Medicine

## 2017-05-03 VITALS — BP 130/62 | HR 80 | Temp 97.5°F | Resp 16 | Wt 125.4 lb

## 2017-05-03 DIAGNOSIS — B351 Tinea unguium: Secondary | ICD-10-CM | POA: Diagnosis not present

## 2017-05-08 ENCOUNTER — Encounter: Payer: Self-pay | Admitting: Internal Medicine

## 2017-05-08 NOTE — Progress Notes (Signed)
Subjective:    Patient ID: Jacqueline Sanchez, female    DOB: [birth year], 83 y.o.   MRN: [id number]  HPI  Asked to see resident in [address] Resident requesting a nail trim Nails thick, long, getting caught on socks causing pain.  Review of Systems      Past Medical History:  Diagnosis Date  . Aphasia 04/2016   Transient---??TIA  . Asthma   . CKD (chronic kidney disease)   . Edema   . Femur fracture, left ([place]) 09/06/2016  . GERD (gastroesophageal reflux disease)   . Hyperlipidemia   . Hypertension   . Osteoporosis     Current Outpatient Medications  Medication Sig Dispense Refill  . acetaminophen (TYLENOL) 325 MG tablet Take 2 tablets (650 mg total) by mouth every 6 (six) hours as needed for mild pain.    Marland Kitchen amLODipine (NORVASC) 10 MG tablet Take 1 tablet (10 mg total) by mouth daily. (Patient taking differently: Take 10 mg by mouth every morning. ) 30 tablet 0  . aspirin EC 81 MG tablet Take 81 mg by mouth every other day.    Marland Kitchen atorvastatin (LIPITOR) 80 MG tablet Take 1 tablet by mouth every evening.     . ferrous sulfate 325 (65 FE) MG tablet Take 325 mg by mouth daily with breakfast.    . hydrALAZINE (APRESOLINE) 25 MG tablet Take 1 tablet (25 mg total) by mouth 4 (four) times daily. (Patient taking differently: Take 25 mg by mouth 3 (three) times daily. ) 120 tablet 1  . losartan (COZAAR) 50 MG tablet Take 1 tablet by mouth every evening.     . Melatonin 3 MG TABS Take 1 tablet by mouth at bedtime.    . metoprolol succinate (TOPROL-XL) 50 MG 24 hr tablet Take 1 tablet (50 mg total) by mouth daily. Take with or immediately following a meal. (Patient taking differently: Take 50 mg by mouth every morning. Take with or immediately following a meal.) 30 tablet 1  . Multiple Vitamin (MULTI-VITAMINS) TABS Take 1 tablet by mouth daily.    Marland Kitchen oxyCODONE-acetaminophen (PERCOCET/ROXICET) 5-325 MG tablet Take 1 tablet by mouth at bedtime. And 1 tid prn    . polyethylene glycol (MIRALAX /  GLYCOLAX) packet Take 17 g by mouth daily.    Marland Kitchen senna (SENOKOT) 8.6 MG tablet Take 2 tablets by mouth 2 (two) times daily.     . vitamin B-12 (CYANOCOBALAMIN) 1000 MCG tablet Take 1,000 mcg by mouth daily.    . Vitamin D, Cholecalciferol, 1000 units CAPS Take 1 capsule by mouth daily.     No current facility-administered medications for this visit.     Allergies  Allergen Reactions  . Morphine And Related Nausea Only    Family History  Problem Relation Age of Onset  . Hypertension Other     Social History   Socioeconomic History  . Marital status: Widowed    Spouse name: Not on file  . Number of children: 2  . Years of education: Not on file  . Highest education level: Not on file  Social Needs  . Financial resource strain: Not on file  . Food insecurity - worry: Not on file  . Food insecurity - inability: Not on file  . Transportation needs - medical: Not on file  . Transportation needs - non-medical: Not on file  Occupational History  . Occupation: [employer]--[place]    Comment: Retired  Tobacco Use  . Smoking status: Never Smoker  . Smokeless tobacco: Never  Used  Substance and Sexual Activity  . Alcohol use: No  . Drug use: Not on file  . Sexual activity: No  Other Topics Concern  . Not on file  Social History Narrative   Widowed 2000   Has living will   Sons/DILs are health care POAs (mostly Mike/Jan)   Has DNR   No tube feeds if cognitively unaware     Constitutional: Denies fever, malaise, fatigue, headache or abrupt weight changes.  Skin: Pt reports fungal nails. Denies redness, rashes, lesions or ulcercations.    No other specific complaints in a complete review of systems (except as listed in HPI above).  Objective:   Physical Exam  There were no vitals taken for this visit. Wt Readings from Last 3 Encounters:  04/20/17 126 lb (57.2 kg)  01/18/17 130 lb (59 kg)  12/01/16 128 lb (58.1 kg)    General: Appears her stated age, well developed,  well nourished in NAD. Skin: Mycotic toenails bilaterally.   BMET    Component Value Date/Time   NA 136 12/03/2016 0431   NA 140 05/30/2011 1101   K 4.3 12/03/2016 0431   K 4.3 05/30/2011 1101   CL 102 12/03/2016 0431   CL 99 05/30/2011 1101   CO2 28 12/03/2016 0431   CO2 30 05/30/2011 1101   GLUCOSE 108 (H) 12/03/2016 0431   GLUCOSE 101 (H) 05/30/2011 1101   BUN 18 12/03/2016 0431   BUN 20 (H) 05/30/2011 1101   CREATININE 1.05 (H) 12/03/2016 0431   CREATININE 1.69 (H) 11/18/2011 1535   CALCIUM 10.0 12/03/2016 0431   CALCIUM 11.0 (H) 05/30/2011 1101   GFRNONAA 44 (L) 12/03/2016 0431   GFRNONAA 26 (L) 11/18/2011 1535   GFRAA 51 (L) 12/03/2016 0431   GFRAA 31 (L) 11/18/2011 1535    Lipid Panel  No results found for: CHOL, TRIG, HDL, CHOLHDL, VLDL, LDLCALC  CBC    Component Value Date/Time   WBC 11.3 (H) 12/03/2016 0431   RBC 3.11 (L) 12/03/2016 0431   HGB 10.2 (L) 12/03/2016 0431   HGB 12.7 05/30/2011 1101   HCT 30.4 (L) 12/03/2016 0431   HCT 37.8 05/30/2011 1101   PLT 266 12/03/2016 0431   PLT 237 05/30/2011 1101   MCV 97.9 12/03/2016 0431   MCV 101 (H) 05/30/2011 1101   MCH 32.9 12/03/2016 0431   MCHC 33.7 12/03/2016 0431   RDW 13.9 12/03/2016 0431   RDW 13.5 05/30/2011 1101   LYMPHSABS 0.7 (L) 09/06/2016 2338   MONOABS 1.0 (H) 09/06/2016 2338   EOSABS 0.0 09/06/2016 2338   BASOSABS 0.0 09/06/2016 2338    Hgb A1C No results found for: HGBA1C          Assessment & Plan:   Mycotic Toenails:  Trimmed by provider using dremmel tool  Will reassess as needed Webb Silversmith, NP

## 2017-05-08 NOTE — Patient Instructions (Signed)

## 2017-07-05 ENCOUNTER — Ambulatory Visit: Payer: Medicare PPO | Admitting: Internal Medicine

## 2017-07-05 VITALS — BP 130/60 | HR 73 | Temp 97.2°F | Resp 20 | Wt 127.8 lb

## 2017-07-05 DIAGNOSIS — B351 Tinea unguium: Secondary | ICD-10-CM | POA: Diagnosis not present

## 2017-07-05 DIAGNOSIS — M816 Localized osteoporosis [Lequesne]: Secondary | ICD-10-CM | POA: Diagnosis not present

## 2017-07-05 DIAGNOSIS — I10 Essential (primary) hypertension: Secondary | ICD-10-CM

## 2017-07-05 DIAGNOSIS — M159 Polyosteoarthritis, unspecified: Secondary | ICD-10-CM | POA: Diagnosis not present

## 2017-07-05 DIAGNOSIS — N183 Chronic kidney disease, stage 3 unspecified: Secondary | ICD-10-CM

## 2017-07-05 DIAGNOSIS — K219 Gastro-esophageal reflux disease without esophagitis: Secondary | ICD-10-CM | POA: Diagnosis not present

## 2017-07-05 DIAGNOSIS — E7800 Pure hypercholesterolemia, unspecified: Secondary | ICD-10-CM | POA: Diagnosis not present

## 2017-07-28 ENCOUNTER — Encounter: Payer: Self-pay | Admitting: Internal Medicine

## 2017-07-28 DIAGNOSIS — K219 Gastro-esophageal reflux disease without esophagitis: Secondary | ICD-10-CM | POA: Insufficient documentation

## 2017-07-28 DIAGNOSIS — M810 Age-related osteoporosis without current pathological fracture: Secondary | ICD-10-CM | POA: Insufficient documentation

## 2017-07-28 NOTE — Assessment & Plan Note (Signed)
No issues off meds °Will monitor °

## 2017-07-28 NOTE — Assessment & Plan Note (Signed)
Creat stable Continue ARB

## 2017-07-28 NOTE — Progress Notes (Signed)
Subjective:    Patient ID: Jacqueline Sanchez, female    DOB: [birth year], 84 y.o.   MRN: [id number]  HPI  Routine visit for resident in [address] No concerns from staff Resident reports thick long toenails causing pain when putting on socks and wearing shoes. She would like those trimmed today. Otherwise doing well. Sleeps well. Independent with ADL's. Walks with rolling walker. Appetite good. Weight stable. Denies reflux or SOB.  OA: Controlled with Tylenol and Oxycodone  CKD: Creat 1.05. On Losartan.  GERD: No issues off meds.  HTN: Controlled with Amlodipine, Hydralazine, Losartan and Metoprolol.  HLD: She is taking Atorvastatin as prescribed. She denies myalgias.  Osteoporosis: Taking Vit D daily.  Review of Systems      Past Medical History:  Diagnosis Date  . Aphasia 04/2016   Transient---??TIA  . Asthma   . CKD (chronic kidney disease)   . Edema   . Femur fracture, left ([place]) 09/06/2016  . GERD (gastroesophageal reflux disease)   . Hyperlipidemia   . Hypertension   . Osteoporosis     Current Outpatient Medications  Medication Sig Dispense Refill  . acetaminophen (TYLENOL) 325 MG tablet Take 2 tablets (650 mg total) by mouth every 6 (six) hours as needed for mild pain.    Marland Kitchen amLODipine (NORVASC) 10 MG tablet Take 1 tablet (10 mg total) by mouth daily. (Patient taking differently: Take 10 mg by mouth every morning. ) 30 tablet 0  . aspirin EC 81 MG tablet Take 81 mg by mouth every other day.    Marland Kitchen atorvastatin (LIPITOR) 80 MG tablet Take 1 tablet by mouth every evening.     . ferrous sulfate 325 (65 FE) MG tablet Take 325 mg by mouth daily with breakfast.    . hydrALAZINE (APRESOLINE) 25 MG tablet Take 1 tablet (25 mg total) by mouth 4 (four) times daily. (Patient taking differently: Take 25 mg by mouth 3 (three) times daily. ) 120 tablet 1  . losartan (COZAAR) 50 MG tablet Take 1 tablet by mouth every evening.     . Melatonin 3 MG TABS Take 1 tablet by mouth at  bedtime.    . metoprolol succinate (TOPROL-XL) 50 MG 24 hr tablet Take 1 tablet (50 mg total) by mouth daily. Take with or immediately following a meal. (Patient taking differently: Take 50 mg by mouth every morning. Take with or immediately following a meal.) 30 tablet 1  . Multiple Vitamin (MULTI-VITAMINS) TABS Take 1 tablet by mouth daily.    Marland Kitchen oxyCODONE-acetaminophen (PERCOCET/ROXICET) 5-325 MG tablet Take 1 tablet by mouth at bedtime. And 1 tid prn    . polyethylene glycol (MIRALAX / GLYCOLAX) packet Take 17 g by mouth daily.    Marland Kitchen senna (SENOKOT) 8.6 MG tablet Take 2 tablets by mouth 2 (two) times daily.     . vitamin B-12 (CYANOCOBALAMIN) 1000 MCG tablet Take 1,000 mcg by mouth daily.    . Vitamin D, Cholecalciferol, 1000 units CAPS Take 1 capsule by mouth daily.     No current facility-administered medications for this visit.     Allergies  Allergen Reactions  . Morphine And Related Nausea Only    Family History  Problem Relation Age of Onset  . Hypertension Other     Social History   Socioeconomic History  . Marital status: Widowed    Spouse name: Not on file  . Number of children: 2  . Years of education: Not on file  . Highest education level: Not  on file  Social Needs  . Financial resource strain: Not on file  . Food insecurity - worry: Not on file  . Food insecurity - inability: Not on file  . Transportation needs - medical: Not on file  . Transportation needs - non-medical: Not on file  Occupational History  . Occupation: [employer]--[place]    Comment: Retired  Tobacco Use  . Smoking status: Never Smoker  . Smokeless tobacco: Never Used  Substance and Sexual Activity  . Alcohol use: No  . Drug use: Not on file  . Sexual activity: No  Other Topics Concern  . Not on file  Social History Narrative   Widowed 2000   Has living will   Sons/DILs are health care POAs (mostly Mike/Jan)   Has DNR   No tube feeds if cognitively unaware     Constitutional:  Denies fever, malaise, fatigue, headache or abrupt weight changes.  HEENT: Denies eye pain, eye redness, ear pain, ringing in the ears, wax buildup, runny nose, nasal congestion, bloody nose, or sore throat. Respiratory: Denies difficulty breathing, shortness of breath, cough or sputum production.   Cardiovascular: Denies chest pain, chest tightness, palpitations or swelling in the hands or feet.  Gastrointestinal: Denies abdominal pain, bloating, constipation, diarrhea or blood in the stool.  GU: Denies urgency, frequency, pain with urination, burning sensation, blood in urine, odor or discharge. Musculoskeletal: Denies decrease in range of motion, difficulty with gait, muscle pain or joint pain and swelling.  Skin: Pt reports thick, long nails. Denies redness, rashes, lesions or ulcercations.  Neurological: Denies dizziness, difficulty with memory, difficulty with speech or problems with balance and coordination.  Psych: Denies anxiety, depression, SI/HI.  No other specific complaints in a complete review of systems (except as listed in HPI above).  Objective:   Physical Exam   BP 130/60   Pulse 73   Temp (!) 97.2 F (36.2 C)   Resp 20   Wt 127 lb 12.8 oz (58 kg)   BMI 21.94 kg/m  Wt Readings from Last 3 Encounters:  07/28/17 127 lb 12.8 oz (58 kg)  05/08/17 125 lb 6.4 oz (56.9 kg)  04/20/17 126 lb (57.2 kg)    General: Appears her stated age, in NAD. Skin: Mycotic toenails bilaterally Cardiovascular: Normal rate and rhythm. S1,S2 noted.  Murmur noted. No BLE edema noted. Pulmonary/Chest: Normal effort and positive vesicular breath sounds. No respiratory distress. No wheezes, rales or ronchi noted.  Abdomen: Soft and nontender. Normal bowel sounds.  Musculoskeletal: Gait slow and steady with use of rolling walker. Neurological: Alert and oriented.     BMET    Component Value Date/Time   NA 136 12/03/2016 0431   NA 140 05/30/2011 1101   K 4.3 12/03/2016 0431   K 4.3  05/30/2011 1101   CL 102 12/03/2016 0431   CL 99 05/30/2011 1101   CO2 28 12/03/2016 0431   CO2 30 05/30/2011 1101   GLUCOSE 108 (H) 12/03/2016 0431   GLUCOSE 101 (H) 05/30/2011 1101   BUN 18 12/03/2016 0431   BUN 20 (H) 05/30/2011 1101   CREATININE 1.05 (H) 12/03/2016 0431   CREATININE 1.69 (H) 11/18/2011 1535   CALCIUM 10.0 12/03/2016 0431   CALCIUM 11.0 (H) 05/30/2011 1101   GFRNONAA 44 (L) 12/03/2016 0431   GFRNONAA 26 (L) 11/18/2011 1535   GFRAA 51 (L) 12/03/2016 0431   GFRAA 31 (L) 11/18/2011 1535    Lipid Panel  No results found for: CHOL, TRIG, HDL, CHOLHDL, VLDL, LDLCALC  CBC    Component Value Date/Time   WBC 11.3 (H) 12/03/2016 0431   RBC 3.11 (L) 12/03/2016 0431   HGB 10.2 (L) 12/03/2016 0431   HGB 12.7 05/30/2011 1101   HCT 30.4 (L) 12/03/2016 0431   HCT 37.8 05/30/2011 1101   PLT 266 12/03/2016 0431   PLT 237 05/30/2011 1101   MCV 97.9 12/03/2016 0431   MCV 101 (H) 05/30/2011 1101   MCH 32.9 12/03/2016 0431   MCHC 33.7 12/03/2016 0431   RDW 13.9 12/03/2016 0431   RDW 13.5 05/30/2011 1101   LYMPHSABS 0.7 (L) 09/06/2016 2338   MONOABS 1.0 (H) 09/06/2016 2338   EOSABS 0.0 09/06/2016 2338   BASOSABS 0.0 09/06/2016 2338    Hgb A1C No results found for: HGBA1C         Assessment & Plan:   Mycotic Toenails:  Trimmed by provider using dremmel tool  Will reassess as needed Webb Silversmith, NP

## 2017-07-28 NOTE — Assessment & Plan Note (Signed)
Continue Atorvastatin

## 2017-07-28 NOTE — Assessment & Plan Note (Signed)
Controlled on multiple meds Monitor

## 2017-07-28 NOTE — Assessment & Plan Note (Signed)
Continue daily Vit D Getting daily weight bearing exercise

## 2017-07-28 NOTE — Patient Instructions (Signed)

## 2017-07-28 NOTE — Assessment & Plan Note (Addendum)
Continue Tylenol and Oxycodone

## 2017-10-09 DIAGNOSIS — I10 Essential (primary) hypertension: Secondary | ICD-10-CM | POA: Diagnosis not present

## 2017-10-09 LAB — CBC AND DIFFERENTIAL
HEMATOCRIT: 30 — AB (ref 36–46)
Hemoglobin: 10.1 — AB (ref 12.0–16.0)

## 2017-10-09 LAB — BASIC METABOLIC PANEL: CREATININE: 1.3 — AB (ref 0.5–1.1)

## 2017-10-11 ENCOUNTER — Encounter: Payer: Self-pay | Admitting: Internal Medicine

## 2017-10-12 ENCOUNTER — Encounter: Payer: Self-pay | Admitting: Internal Medicine

## 2017-10-12 ENCOUNTER — Ambulatory Visit (INDEPENDENT_AMBULATORY_CARE_PROVIDER_SITE_OTHER): Payer: Medicare PPO | Admitting: Internal Medicine

## 2017-10-12 VITALS — BP 102/54 | HR 74 | Temp 98.3°F | Resp 18 | Wt 125.2 lb

## 2017-10-12 DIAGNOSIS — N183 Chronic kidney disease, stage 3 unspecified: Secondary | ICD-10-CM

## 2017-10-12 DIAGNOSIS — M159 Polyosteoarthritis, unspecified: Secondary | ICD-10-CM | POA: Diagnosis not present

## 2017-10-12 DIAGNOSIS — J309 Allergic rhinitis, unspecified: Secondary | ICD-10-CM | POA: Diagnosis not present

## 2017-10-12 DIAGNOSIS — I10 Essential (primary) hypertension: Secondary | ICD-10-CM

## 2017-10-12 DIAGNOSIS — F1120 Opioid dependence, uncomplicated: Secondary | ICD-10-CM

## 2017-10-12 NOTE — Assessment & Plan Note (Signed)
Does well with the percocet meds controlled by RN--no risk for diversion, etc

## 2017-10-12 NOTE — Assessment & Plan Note (Signed)
BP Readings from Last 3 Encounters:  10/12/17 (!) 102/54  07/28/17 130/60  05/08/17 130/62   Good control  No symptoms

## 2017-10-12 NOTE — Assessment & Plan Note (Signed)
Has improved since THR Ongoing chronic pain Feels the percocet helps and lets her maintain activity

## 2017-10-12 NOTE — Assessment & Plan Note (Signed)
Ongoing No apparent infection Will restart and continue the cetirizine

## 2017-10-12 NOTE — Assessment & Plan Note (Signed)
Last creatinine up slightly Will continue the ARB

## 2017-10-12 NOTE — Progress Notes (Signed)
Subjective:    Patient ID: Jacqueline Sanchez, female    DOB: [birth year], 84 y.o.   MRN: [id number]  HPI Visit in room in assisted living for review of chronic medical conditions Reviewed status with [name] RN--no acute issues  Not as happy with THR as she hoped Ongoing pain  Gets percocet bid generally and occasionally more Tris to walk daily---uses cane or walker (if longer distance) Hasn't found tylenol to be very helpful  Having a lot of phlegm Coughing up stuff Did get cetirizine for a while--helped but not totally Not sick and no fever  No chest pain No SOB No dizziness or syncope No dizziness or syncope  Recent creatinine up slightly  Continues on the [facility]  Current Outpatient Medications on File Prior to Visit  Medication Sig Dispense Refill  . acetaminophen (TYLENOL) 325 MG tablet Take 2 tablets (650 mg total) by mouth every 6 (six) hours as needed for mild pain.    Marland Kitchen amLODipine (NORVASC) 10 MG tablet Take 1 tablet (10 mg total) by mouth daily. (Patient taking differently: Take 10 mg by mouth every morning. ) 30 tablet 0  . aspirin EC 81 MG tablet Take 81 mg by mouth every other day.    Marland Kitchen atorvastatin (LIPITOR) 80 MG tablet Take 1 tablet by mouth every evening.     . ferrous sulfate 325 (65 FE) MG tablet Take 325 mg by mouth daily with breakfast.    . hydrALAZINE (APRESOLINE) 25 MG tablet Take 1 tablet (25 mg total) by mouth 4 (four) times daily. (Patient taking differently: Take 25 mg by mouth 3 (three) times daily. ) 120 tablet 1  . losartan (COZAAR) 50 MG tablet Take 1 tablet by mouth every evening.     . Melatonin 3 MG TABS Take 1 tablet by mouth at bedtime.    . metoprolol succinate (TOPROL-XL) 50 MG 24 hr tablet Take 1 tablet (50 mg total) by mouth daily. Take with or immediately following a meal. (Patient taking differently: Take 50 mg by mouth every morning. Take with or immediately following a meal.) 30 tablet 1  . Multiple Vitamin (MULTI-VITAMINS) TABS Take 1  tablet by mouth daily.    Marland Kitchen oxyCODONE-acetaminophen (PERCOCET/ROXICET) 5-325 MG tablet Take 1 tablet by mouth at bedtime. And 1 tid prn    . polyethylene glycol (MIRALAX / GLYCOLAX) packet Take 17 g by mouth daily.    Marland Kitchen senna (SENOKOT) 8.6 MG tablet Take 2 tablets by mouth 2 (two) times daily.     . vitamin B-12 (CYANOCOBALAMIN) 1000 MCG tablet Take 1,000 mcg by mouth daily.    . Vitamin D, Cholecalciferol, 1000 units CAPS Take 1 capsule by mouth daily.     No current facility-administered medications on file prior to visit.     Allergies  Allergen Reactions  . Morphine And Related Nausea Only    Past Medical History:  Diagnosis Date  . Aphasia 04/2016   Transient---??TIA  . Asthma   . CKD (chronic kidney disease)   . Edema   . Femur fracture, left ([place]) 09/06/2016  . GERD (gastroesophageal reflux disease)   . Hyperlipidemia   . Hypertension   . Osteoporosis     Past Surgical History:  Procedure Laterality Date  . ANTERIOR HIP REVISION Left 12/01/2016   Procedure: ANTERIOR HIP REVISION, REVISION TO TOTAL;  Surgeon: Hessie Knows, MD;  Location: [facility];  Service: Orthopedics;  Laterality: Left;  . HIP ARTHROPLASTY Left 09/07/2016   Procedure: ARTHROPLASTY BIPOLAR HIP (HEMIARTHROPLASTY);  Surgeon: Thornton Park, MD;  Location: [facility];  Service: Orthopedics;  Laterality: Left;  . HIP CLOSED REDUCTION Left 11/03/2016   Procedure: CLOSED REDUCTION HIP;  Surgeon: Thornton Park, MD;  Location: [facility];  Service: Orthopedics;  Laterality: Left;  . HIP CLOSED REDUCTION Left 11/24/2016   Procedure: CLOSED REDUCTION HIP;  Surgeon: Thornton Park, MD;  Location: [facility];  Service: Orthopedics;  Laterality: Left;  . KYPHOPLASTY      Family History  Problem Relation Age of Onset  . Hypertension Other     Social History   Socioeconomic History  . Marital status: Widowed    Spouse name: Not on file  . Number of children: 2  . Years of education: Not on file  . Highest  education level: Not on file  Occupational History  . 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 Financial resource strain: Not on file  . Food insecurity:    Worry: Not on file    Inability: Not on file  . Transportation needs:    Medical: Not on file    Non-medical: Not on file  Tobacco Use  . Smoking status: Never Smoker  . Smokeless tobacco: Never Used  Substance and Sexual Activity  . Alcohol use: No  . Drug use: Not on file  . Sexual activity: Never  Lifestyle  . Physical activity:    Days per week: Not on file    Minutes per session: Not on file  . Stress: Not on file  Relationships  . Social connections:    Talks on phone: Not on file    Gets together: Not on file    Attends religious service: Not on file    Active member of club or organization: Not on file    Attends meetings of clubs or organizations: Not on file    Relationship status: Not on file  . Intimate partner violence:    Fear of current or ex partner: Not on file    Emotionally abused: Not on file    Physically abused: Not on file    Forced sexual activity: Not on file  Other Topics Concern  . Not on file  Social History Narrative   Widowed 2000   Has living will   Sons/DILs are health care POAs (mostly Mike/Jan)   Has DNR   No tube feeds if cognitively unaware   Review of Systems No headaches Stools are regular ---has high fiber bread Appetite is fair Weight is now stable after losing some weight Sleeps well Still gets occasional vague discomfort in left upper abdomen. Not related to eating. She can usually change position and it goes away    Objective:   Physical Exam  Constitutional: She appears well-developed. No distress.  Neck: No thyromegaly present.  Cardiovascular: Normal rate and regular rhythm. Exam reveals no gallop.  OQ9/4 systolic murmur  Respiratory: Effort normal and breath sounds normal. No respiratory distress. She has no wheezes. She has no  rales.  GI: Soft. There is no tenderness.  Musculoskeletal: She exhibits no edema or tenderness.  Lymphadenopathy:    She has no cervical adenopathy.  Skin: No rash noted.  Psychiatric: She has a normal mood and affect. Her behavior is normal.           Assessment & Plan:

## 2017-11-17 ENCOUNTER — Ambulatory Visit (INDEPENDENT_AMBULATORY_CARE_PROVIDER_SITE_OTHER): Payer: Medicare PPO | Admitting: Internal Medicine

## 2017-11-17 VITALS — BP 143/57 | HR 66 | Temp 98.7°F | Resp 18 | Wt 126.0 lb

## 2017-11-17 DIAGNOSIS — B351 Tinea unguium: Secondary | ICD-10-CM | POA: Diagnosis not present

## 2017-11-17 DIAGNOSIS — M79674 Pain in right toe(s): Secondary | ICD-10-CM

## 2017-11-17 DIAGNOSIS — M79675 Pain in left toe(s): Secondary | ICD-10-CM

## 2017-11-25 ENCOUNTER — Encounter: Payer: Self-pay | Admitting: Internal Medicine

## 2017-11-25 NOTE — Progress Notes (Signed)
Subjective:    Patient ID: Jacqueline Sanchez, female    DOB: February 03, 1920, 84 y.o.   MRN: [id number]  HPI  Asked to trim toenails. Thick, long getting on socks causing pain.  Review of Systems  Past Medical History:  Diagnosis Date  . Aphasia 04/2016   Transient---??TIA  . Asthma   . CKD (chronic kidney disease)   . Edema   . Femur fracture, left ([place]) 09/06/2016  . GERD (gastroesophageal reflux disease)   . Hyperlipidemia   . Hypertension   . Osteoporosis     Current Outpatient Medications  Medication Sig Dispense Refill  . acetaminophen (TYLENOL) 325 MG tablet Take 2 tablets (650 mg total) by mouth every 6 (six) hours as needed for mild pain.    Marland Kitchen amLODipine (NORVASC) 10 MG tablet Take 1 tablet (10 mg total) by mouth daily. (Patient taking differently: Take 10 mg by mouth every morning. ) 30 tablet 0  . aspirin EC 81 MG tablet Take 81 mg by mouth every other day.    Marland Kitchen atorvastatin (LIPITOR) 80 MG tablet Take 1 tablet by mouth every evening.     . ferrous sulfate 325 (65 FE) MG tablet Take 325 mg by mouth daily with breakfast.    . hydrALAZINE (APRESOLINE) 25 MG tablet Take 1 tablet (25 mg total) by mouth 4 (four) times daily. (Patient taking differently: Take 25 mg by mouth 3 (three) times daily. ) 120 tablet 1  . losartan (COZAAR) 50 MG tablet Take 1 tablet by mouth every evening.     . Melatonin 3 MG TABS Take 1 tablet by mouth at bedtime.    . metoprolol succinate (TOPROL-XL) 50 MG 24 hr tablet Take 1 tablet (50 mg total) by mouth daily. Take with or immediately following a meal. (Patient taking differently: Take 50 mg by mouth every morning. Take with or immediately following a meal.) 30 tablet 1  . Multiple Vitamin (MULTI-VITAMINS) TABS Take 1 tablet by mouth daily.    Marland Kitchen oxyCODONE-acetaminophen (PERCOCET/ROXICET) 5-325 MG tablet Take 1 tablet by mouth at bedtime. And 1 tid prn    . polyethylene glycol (MIRALAX / GLYCOLAX) packet Take 17 g by mouth daily.    Marland Kitchen senna  (SENOKOT) 8.6 MG tablet Take 2 tablets by mouth 2 (two) times daily.     . vitamin B-12 (CYANOCOBALAMIN) 1000 MCG tablet Take 1,000 mcg by mouth daily.    . Vitamin D, Cholecalciferol, 1000 units CAPS Take 1 capsule by mouth daily.     No current facility-administered medications for this visit.     Allergies  Allergen Reactions  . Morphine And Related Nausea Only    Family History  Problem Relation Age of Onset  . Hypertension Other     Social History   Socioeconomic History  . Marital status: Widowed    Spouse name: Not on file  . Number of children: 2  . Years of education: Not on file  . Highest education level: Not on file  Occupational History  . 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 Financial resource strain: Not on file  . Food insecurity:    Worry: Not on file    Inability: Not on file  . Transportation needs:    Medical: Not on file    Non-medical: Not on file  Tobacco Use  . Smoking status: Never Smoker  . Smokeless tobacco: Never Used  Substance and Sexual Activity  . Alcohol use: No  . Drug  use: Not on file  . Sexual activity: Never  Lifestyle  . Physical activity:    Days per week: Not on file    Minutes per session: Not on file  . Stress: Not on file  Relationships  . Social connections:    Talks on phone: Not on file    Gets together: Not on file    Attends religious service: Not on file    Active member of club or organization: Not on file    Attends meetings of clubs or organizations: Not on file    Relationship status: Not on file  . Intimate partner violence:    Fear of current or ex partner: Not on file    Emotionally abused: Not on file    Physically abused: Not on file    Forced sexual activity: Not on file  Other Topics Concern  . Not on file  Social History Narrative   Widowed 2000   Has living will   Sons/DILs are health care POAs (mostly Mike/Jan)   Has DNR   No tube feeds if cognitively  unaware     Constitutional: Denies fever, malaise, fatigue, headache or abrupt weight changes.  Skin: Pt reports long, thick toenails. Denies redness, rashes, lesions or ulcercations.    No other specific complaints in a complete review of systems (except as listed in HPI above).    Objective:   Physical Exam  BP (!) 143/57   Pulse 66   Temp 98.7 F (37.1 C)   Resp 18   Wt 126 lb (57.2 kg)   BMI 21.63 kg/m  Wt Readings from Last 3 Encounters:  11/25/17 126 lb (57.2 kg)  10/12/17 125 lb 3.2 oz (56.8 kg)  07/28/17 127 lb 12.8 oz (58 kg)    General: Appears her stated age, well developed, well nourished in NAD. Skin: Mycotic toenails bilaterally.  BMET    Component Value Date/Time   NA 136 12/03/2016 0431   NA 140 05/30/2011 1101   K 4.3 12/03/2016 0431   K 4.3 05/30/2011 1101   CL 102 12/03/2016 0431   CL 99 05/30/2011 1101   CO2 28 12/03/2016 0431   CO2 30 05/30/2011 1101   GLUCOSE 108 (H) 12/03/2016 0431   GLUCOSE 101 (H) 05/30/2011 1101   BUN 18 12/03/2016 0431   BUN 20 (H) 05/30/2011 1101   CREATININE 1.3 (A) 10/09/2017   CREATININE 1.05 (H) 12/03/2016 0431   CREATININE 1.69 (H) 11/18/2011 1535   CALCIUM 10.0 12/03/2016 0431   CALCIUM 11.0 (H) 05/30/2011 1101   GFRNONAA 44 (L) 12/03/2016 0431   GFRNONAA 26 (L) 11/18/2011 1535   GFRAA 51 (L) 12/03/2016 0431   GFRAA 31 (L) 11/18/2011 1535    Lipid Panel  No results found for: CHOL, TRIG, HDL, CHOLHDL, VLDL, LDLCALC  CBC    Component Value Date/Time   WBC 11.3 (H) 12/03/2016 0431   RBC 3.11 (L) 12/03/2016 0431   HGB 10.1 (A) 10/09/2017   HGB 12.7 05/30/2011 1101   HCT 30 (A) 10/09/2017   HCT 37.8 05/30/2011 1101   PLT 266 12/03/2016 0431   PLT 237 05/30/2011 1101   MCV 97.9 12/03/2016 0431   MCV 101 (H) 05/30/2011 1101   MCH 32.9 12/03/2016 0431   MCHC 33.7 12/03/2016 0431   RDW 13.9 12/03/2016 0431   RDW 13.5 05/30/2011 1101   LYMPHSABS 0.7 (L) 09/06/2016 2338   MONOABS 1.0 (H) 09/06/2016  2338   EOSABS 0.0 09/06/2016 2338   BASOSABS  0.0 09/06/2016 2338    Hgb A1C No results found for: HGBA1C          Assessment & Plan:   Pain due to Toenail Fungus:  Nails 1-5 trimmed by provider using dremmel tool, each foot No complications Pt tolerated procedure well  Will reassess as needed Webb Silversmith, NP

## 2017-11-25 NOTE — Patient Instructions (Signed)

## 2018-01-17 ENCOUNTER — Ambulatory Visit: Payer: Medicare PPO | Admitting: Internal Medicine

## 2018-01-17 DIAGNOSIS — M816 Localized osteoporosis [Lequesne]: Secondary | ICD-10-CM | POA: Diagnosis not present

## 2018-01-17 DIAGNOSIS — N183 Chronic kidney disease, stage 3 unspecified: Secondary | ICD-10-CM

## 2018-01-17 DIAGNOSIS — M159 Polyosteoarthritis, unspecified: Secondary | ICD-10-CM

## 2018-01-17 DIAGNOSIS — E7800 Pure hypercholesterolemia, unspecified: Secondary | ICD-10-CM

## 2018-01-17 DIAGNOSIS — I10 Essential (primary) hypertension: Secondary | ICD-10-CM

## 2018-01-17 DIAGNOSIS — K219 Gastro-esophageal reflux disease without esophagitis: Secondary | ICD-10-CM

## 2018-01-19 ENCOUNTER — Encounter: Payer: Self-pay | Admitting: Internal Medicine

## 2018-01-19 NOTE — Assessment & Plan Note (Signed)
Creatinine reviewed Continue Losartan

## 2018-01-19 NOTE — Assessment & Plan Note (Signed)
Continue Amlodipine, Losartan, Metoprolol and Hydralazine.

## 2018-01-19 NOTE — Assessment & Plan Note (Signed)
Continue Atorvastatin

## 2018-01-19 NOTE — Assessment & Plan Note (Signed)
Continue weight bearing, Calcium and Vit D

## 2018-01-19 NOTE — Progress Notes (Signed)
Subjective:    Patient ID: Jacqueline Sanchez, female    DOB: 12/25/1919, 84 y.o.   MRN: [id number]  HPI  Resident due for routine followup in [address] Reviewed with RN, no new concerns Resident reports she fell 1 month ago, been having to use a walker since that time. She is sleeping well Requesting nail trim but can't have done until 8/29 Appetite fair, weight fairly stable Voids fine, c/o intermittent constipation. Denies pain, reflux or SOB.  Asthma: Denies chronic cough or SOB. Stable off inhalers.  CKD: Creat 1.3. On Losartan.  GERD: No issues off meds. Will monitor.  HLD: She is taking Atorvastatin as prescribed. She consumes a low fat diet.  HTN: Her BP is well controlled on Amlodipine, Losartan, Metoprolol and Hydralazine. ECG from 10/2016 reviewed.  OA: Mainly left hip s/p fracture. Pain controlled with Percocet at Bedtime.  Osteoporosis: Continues to be weight bearing. Continue Calcium and Vit D.  Review of Systems      Past Medical History:  Diagnosis Date  . Aphasia 04/2016   Transient---??TIA  . Asthma   . CKD (chronic kidney disease)   . Edema   . Femur fracture, left ([place]) 09/06/2016  . GERD (gastroesophageal reflux disease)   . Hyperlipidemia   . Hypertension   . Osteoporosis     Current Outpatient Medications  Medication Sig Dispense Refill  . amLODipine (NORVASC) 10 MG tablet Take 1 tablet (10 mg total) by mouth daily. (Patient taking differently: Take 10 mg by mouth every morning. ) 30 tablet 0  . aspirin EC 81 MG tablet Take 81 mg by mouth every other day.    Marland Kitchen atorvastatin (LIPITOR) 80 MG tablet Take 1 tablet by mouth every evening.     . ferrous sulfate 325 (65 FE) MG tablet Take 325 mg by mouth daily with breakfast.    . hydrALAZINE (APRESOLINE) 25 MG tablet Take 1 tablet (25 mg total) by mouth 4 (four) times daily. (Patient taking differently: Take 25 mg by mouth 3 (three) times daily. ) 120 tablet 1  . losartan (COZAAR) 50 MG tablet Take 1  tablet by mouth every evening.     . Melatonin 3 MG TABS Take 1 tablet by mouth at bedtime.    . metoprolol succinate (TOPROL-XL) 50 MG 24 hr tablet Take 1 tablet (50 mg total) by mouth daily. Take with or immediately following a meal. (Patient taking differently: Take 50 mg by mouth every morning. Take with or immediately following a meal.) 30 tablet 1  . Multiple Vitamin (MULTI-VITAMINS) TABS Take 1 tablet by mouth daily.    Marland Kitchen oxyCODONE-acetaminophen (PERCOCET/ROXICET) 5-325 MG tablet Take 1 tablet by mouth at bedtime. And 1 tid prn    . polyethylene glycol (MIRALAX / GLYCOLAX) packet Take 17 g by mouth daily.    Marland Kitchen senna (SENOKOT) 8.6 MG tablet Take 2 tablets by mouth 2 (two) times daily.     . vitamin B-12 (CYANOCOBALAMIN) 1000 MCG tablet Take 1,000 mcg by mouth daily.    . Vitamin D, Cholecalciferol, 1000 units CAPS Take 1 capsule by mouth daily.    Marland Kitchen acetaminophen (TYLENOL) 325 MG tablet Take 2 tablets (650 mg total) by mouth every 6 (six) hours as needed for mild pain. (Patient not taking: Reported on 01/19/2018)     No current facility-administered medications for this visit.     Allergies  Allergen Reactions  . Morphine And Related Nausea Only    Family History  Problem Relation Age of  Onset  . Hypertension Other     Social History   Socioeconomic History  . Marital status: Widowed    Spouse name: Not on file  . Number of children: 2  . Years of education: Not on file  . Highest education level: Not on file  Occupational History  . 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 Financial resource strain: Not on file  . Food insecurity:    Worry: Not on file    Inability: Not on file  . Transportation needs:    Medical: Not on file    Non-medical: Not on file  Tobacco Use  . Smoking status: Never Smoker  . Smokeless tobacco: Never Used  Substance and Sexual Activity  . Alcohol use: No  . Drug use: Not on file  . Sexual activity: Never    Lifestyle  . Physical activity:    Days per week: Not on file    Minutes per session: Not on file  . Stress: Not on file  Relationships  . Social connections:    Talks on phone: Not on file    Gets together: Not on file    Attends religious service: Not on file    Active member of club or organization: Not on file    Attends meetings of clubs or organizations: Not on file    Relationship status: Not on file  . Intimate partner violence:    Fear of current or ex partner: Not on file    Emotionally abused: Not on file    Physically abused: Not on file    Forced sexual activity: Not on file  Other Topics Concern  . Not on file  Social History Narrative   Widowed 2000   Has living will   Sons/DILs are health care POAs (mostly Mike/Jan)   Has DNR   No tube feeds if cognitively unaware     Constitutional: Denies fever, malaise, fatigue, headache or abrupt weight changes.  HEENT: Denies eye pain, eye redness, ear pain, ringing in the ears, wax buildup, runny nose, nasal congestion, bloody nose, or sore throat. Respiratory: Denies difficulty breathing, shortness of breath, cough or sputum production.   Cardiovascular: Denies chest pain, chest tightness, palpitations or swelling in the hands or feet.  Gastrointestinal: Pt reports intermittent constipation. Denies abdominal pain, bloating, diarrhea or blood in the stool.  GU: Denies urgency, frequency, pain with urination, burning sensation, blood in urine, odor or discharge. Musculoskeletal: Pt reports difficulty with gait. Denies decrease in range of motion, muscle pain or joint pain and swelling.  Skin: Pt reports mycotic toenails. Denies redness, rashes, lesions or ulcercations.  Neurological: Denies dizziness, difficulty with memory, difficulty with speech or problems with balance and coordination.  Psych: Denies anxiety, depression, SI/HI.  No other specific complaints in a complete review of systems (except as listed in HPI  above).  Objective:   Physical Exam   BP 124/82   Pulse 69   Temp 98.2 F (36.8 C)   Resp 16   Wt 123 lb (55.8 kg)   BMI 21.11 kg/m  Wt Readings from Last 3 Encounters:  01/19/18 123 lb (55.8 kg)  11/25/17 126 lb (57.2 kg)  10/12/17 125 lb 3.2 oz (56.8 kg)    General: Appears her stated age, in NAD. Skin: Mycotic toenails noted bilaterally. Neck:  No nodes or JVD. Cardiovascular: Normal rate and rhythm. Murmur noted. Pulmonary/Chest: Normal effort and positive vesicular breath sounds. No respiratory distress. No wheezes,  rales or ronchi noted.  Abdomen: Soft and nontender. Normal bowel sounds.  Musculoskeletal: Gait slow but steady with use of walker. Neurological: Alert and oriented.   BMET    Component Value Date/Time   NA 136 12/03/2016 0431   NA 140 05/30/2011 1101   K 4.3 12/03/2016 0431   K 4.3 05/30/2011 1101   CL 102 12/03/2016 0431   CL 99 05/30/2011 1101   CO2 28 12/03/2016 0431   CO2 30 05/30/2011 1101   GLUCOSE 108 (H) 12/03/2016 0431   GLUCOSE 101 (H) 05/30/2011 1101   BUN 18 12/03/2016 0431   BUN 20 (H) 05/30/2011 1101   CREATININE 1.3 (A) 10/09/2017   CREATININE 1.05 (H) 12/03/2016 0431   CREATININE 1.69 (H) 11/18/2011 1535   CALCIUM 10.0 12/03/2016 0431   CALCIUM 11.0 (H) 05/30/2011 1101   GFRNONAA 44 (L) 12/03/2016 0431   GFRNONAA 26 (L) 11/18/2011 1535   GFRAA 51 (L) 12/03/2016 0431   GFRAA 31 (L) 11/18/2011 1535    Lipid Panel  No results found for: CHOL, TRIG, HDL, CHOLHDL, VLDL, LDLCALC  CBC    Component Value Date/Time   WBC 11.3 (H) 12/03/2016 0431   RBC 3.11 (L) 12/03/2016 0431   HGB 10.1 (A) 10/09/2017   HGB 12.7 05/30/2011 1101   HCT 30 (A) 10/09/2017   HCT 37.8 05/30/2011 1101   PLT 266 12/03/2016 0431   PLT 237 05/30/2011 1101   MCV 97.9 12/03/2016 0431   MCV 101 (H) 05/30/2011 1101   MCH 32.9 12/03/2016 0431   MCHC 33.7 12/03/2016 0431   RDW 13.9 12/03/2016 0431   RDW 13.5 05/30/2011 1101   LYMPHSABS 0.7 (L)  09/06/2016 2338   MONOABS 1.0 (H) 09/06/2016 2338   EOSABS 0.0 09/06/2016 2338   BASOSABS 0.0 09/06/2016 2338    Hgb A1C No results found for: HGBA1C         Assessment & Plan:

## 2018-01-19 NOTE — Assessment & Plan Note (Signed)
Controlled off meds  Will monitor 

## 2018-01-19 NOTE — Assessment & Plan Note (Signed)
Continue Percocet at bedtime

## 2018-02-02 ENCOUNTER — Ambulatory Visit: Payer: Medicare PPO | Admitting: Internal Medicine

## 2018-02-02 VITALS — BP 135/78 | HR 72 | Resp 18

## 2018-02-02 DIAGNOSIS — B351 Tinea unguium: Secondary | ICD-10-CM | POA: Diagnosis not present

## 2018-02-02 DIAGNOSIS — M79675 Pain in left toe(s): Secondary | ICD-10-CM | POA: Diagnosis not present

## 2018-02-02 DIAGNOSIS — M79674 Pain in right toe(s): Secondary | ICD-10-CM

## 2018-02-07 ENCOUNTER — Encounter: Payer: Self-pay | Admitting: Internal Medicine

## 2018-02-07 NOTE — Patient Instructions (Signed)

## 2018-02-07 NOTE — Progress Notes (Signed)
Subjective:    Patient ID: Jacqueline Sanchez, female    DOB: 06/21/1919, 84 y.o.   MRN: [id number]  HPI  Asked to see resident in [address] Requesting nail trim. Nails thick, long getting caught on socks causing pain.  Review of Systems      Past Medical History:  Diagnosis Date  . Aphasia 04/2016   Transient---??TIA  . Asthma   . CKD (chronic kidney disease)   . Edema   . Femur fracture, left ([place]) 09/06/2016  . GERD (gastroesophageal reflux disease)   . Hyperlipidemia   . Hypertension   . Osteoporosis     Current Outpatient Medications  Medication Sig Dispense Refill  . acetaminophen (TYLENOL) 325 MG tablet Take 2 tablets (650 mg total) by mouth every 6 (six) hours as needed for mild pain. (Patient not taking: Reported on 01/19/2018)    . amLODipine (NORVASC) 10 MG tablet Take 1 tablet (10 mg total) by mouth daily. (Patient taking differently: Take 10 mg by mouth every morning. ) 30 tablet 0  . aspirin EC 81 MG tablet Take 81 mg by mouth every other day.    Marland Kitchen atorvastatin (LIPITOR) 80 MG tablet Take 1 tablet by mouth every evening.     . ferrous sulfate 325 (65 FE) MG tablet Take 325 mg by mouth daily with breakfast.    . hydrALAZINE (APRESOLINE) 25 MG tablet Take 1 tablet (25 mg total) by mouth 4 (four) times daily. (Patient taking differently: Take 25 mg by mouth 3 (three) times daily. ) 120 tablet 1  . losartan (COZAAR) 50 MG tablet Take 1 tablet by mouth every evening.     . Melatonin 3 MG TABS Take 1 tablet by mouth at bedtime.    . metoprolol succinate (TOPROL-XL) 50 MG 24 hr tablet Take 1 tablet (50 mg total) by mouth daily. Take with or immediately following a meal. (Patient taking differently: Take 50 mg by mouth every morning. Take with or immediately following a meal.) 30 tablet 1  . Multiple Vitamin (MULTI-VITAMINS) TABS Take 1 tablet by mouth daily.    Marland Kitchen oxyCODONE-acetaminophen (PERCOCET/ROXICET) 5-325 MG tablet Take 1 tablet by mouth at bedtime. And 1 tid prn      . polyethylene glycol (MIRALAX / GLYCOLAX) packet Take 17 g by mouth daily.    Marland Kitchen senna (SENOKOT) 8.6 MG tablet Take 2 tablets by mouth 2 (two) times daily.     . vitamin B-12 (CYANOCOBALAMIN) 1000 MCG tablet Take 1,000 mcg by mouth daily.    . Vitamin D, Cholecalciferol, 1000 units CAPS Take 1 capsule by mouth daily.     No current facility-administered medications for this visit.     Allergies  Allergen Reactions  . Morphine And Related Nausea Only    Family History  Problem Relation Age of Onset  . Hypertension Other     Social History   Socioeconomic History  . Marital status: Widowed    Spouse name: Not on file  . Number of children: 2  . Years of education: Not on file  . Highest education level: Not on file  Occupational History  . 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 Financial resource strain: Not on file  . Food insecurity:    Worry: Not on file    Inability: Not on file  . Transportation needs:    Medical: Not on file    Non-medical: Not on file  Tobacco Use  . Smoking status: Never Smoker  .  Smokeless tobacco: Never Used  Substance and Sexual Activity  . Alcohol use: No  . Drug use: Not on file  . Sexual activity: Never  Lifestyle  . Physical activity:    Days per week: Not on file    Minutes per session: Not on file  . Stress: Not on file  Relationships  . Social connections:    Talks on phone: Not on file    Gets together: Not on file    Attends religious service: Not on file    Active member of club or organization: Not on file    Attends meetings of clubs or organizations: Not on file    Relationship status: Not on file  . Intimate partner violence:    Fear of current or ex partner: Not on file    Emotionally abused: Not on file    Physically abused: Not on file    Forced sexual activity: Not on file  Other Topics Concern  . Not on file  Social History Narrative   Widowed 2000   Has living will    Sons/DILs are health care POAs (mostly Mike/Jan)   Has DNR   No tube feeds if cognitively unaware     Constitutional: Denies fever, malaise, fatigue, headache or abrupt weight changes.  Skin: Pt reports toenail fungus.    No other specific complaints in a complete review of systems (except as listed in HPI above).  Objective:   Physical Exam  BP 135/78   Pulse 72   Resp 18    Wt Readings from Last 3 Encounters:  01/19/18 123 lb (55.8 kg)  11/25/17 126 lb (57.2 kg)  10/12/17 125 lb 3.2 oz (56.8 kg)    General: Appears her stated age, well developed, well nourished in NAD. Skin: Mycotic toenails noted bilaterally.  BMET    Component Value Date/Time   NA 136 12/03/2016 0431   NA 140 05/30/2011 1101   K 4.3 12/03/2016 0431   K 4.3 05/30/2011 1101   CL 102 12/03/2016 0431   CL 99 05/30/2011 1101   CO2 28 12/03/2016 0431   CO2 30 05/30/2011 1101   GLUCOSE 108 (H) 12/03/2016 0431   GLUCOSE 101 (H) 05/30/2011 1101   BUN 18 12/03/2016 0431   BUN 20 (H) 05/30/2011 1101   CREATININE 1.3 (A) 10/09/2017   CREATININE 1.05 (H) 12/03/2016 0431   CREATININE 1.69 (H) 11/18/2011 1535   CALCIUM 10.0 12/03/2016 0431   CALCIUM 11.0 (H) 05/30/2011 1101   GFRNONAA 44 (L) 12/03/2016 0431   GFRNONAA 26 (L) 11/18/2011 1535   GFRAA 51 (L) 12/03/2016 0431   GFRAA 31 (L) 11/18/2011 1535    Lipid Panel  No results found for: CHOL, TRIG, HDL, CHOLHDL, VLDL, LDLCALC  CBC    Component Value Date/Time   WBC 11.3 (H) 12/03/2016 0431   RBC 3.11 (L) 12/03/2016 0431   HGB 10.1 (A) 10/09/2017   HGB 12.7 05/30/2011 1101   HCT 30 (A) 10/09/2017   HCT 37.8 05/30/2011 1101   PLT 266 12/03/2016 0431   PLT 237 05/30/2011 1101   MCV 97.9 12/03/2016 0431   MCV 101 (H) 05/30/2011 1101   MCH 32.9 12/03/2016 0431   MCHC 33.7 12/03/2016 0431   RDW 13.9 12/03/2016 0431   RDW 13.5 05/30/2011 1101   LYMPHSABS 0.7 (L) 09/06/2016 2338   MONOABS 1.0 (H) 09/06/2016 2338   EOSABS 0.0 09/06/2016 2338    BASOSABS 0.0 09/06/2016 2338    Hgb A1C No results found  for: HGBA1C         Assessment & Plan:   Pain d/t Toenail Fungus:  Nails 1-5 trimmed on bilateral feet by provider using dremmel tool. Pt tolerated procedure well. No complications.  Will reassess as needed Webb Silversmith, NP

## 2018-04-12 ENCOUNTER — Encounter: Payer: Self-pay | Admitting: Internal Medicine

## 2018-04-12 ENCOUNTER — Ambulatory Visit: Payer: Medicare PPO | Admitting: Internal Medicine

## 2018-04-12 VITALS — BP 132/56 | HR 76 | Resp 16 | Wt 131.2 lb

## 2018-04-12 DIAGNOSIS — E7800 Pure hypercholesterolemia, unspecified: Secondary | ICD-10-CM | POA: Diagnosis not present

## 2018-04-12 DIAGNOSIS — M159 Polyosteoarthritis, unspecified: Secondary | ICD-10-CM

## 2018-04-12 DIAGNOSIS — M791 Myalgia, unspecified site: Secondary | ICD-10-CM | POA: Diagnosis not present

## 2018-04-12 DIAGNOSIS — N183 Chronic kidney disease, stage 3 unspecified: Secondary | ICD-10-CM

## 2018-04-12 DIAGNOSIS — I10 Essential (primary) hypertension: Secondary | ICD-10-CM | POA: Diagnosis not present

## 2018-04-12 NOTE — Assessment & Plan Note (Signed)
Discussed primary prevention at her age (likely not a good idea) Will stop statin

## 2018-04-12 NOTE — Assessment & Plan Note (Signed)
No clear injury  Hips okay Likely short lived situation Will stop the statin just in case though

## 2018-04-12 NOTE — Assessment & Plan Note (Signed)
On ARB 

## 2018-04-12 NOTE — Assessment & Plan Note (Signed)
Chronic pain  Doing okay with extra help and narcotic analgesics

## 2018-04-12 NOTE — Assessment & Plan Note (Signed)
BP Readings from Last 3 Encounters:  04/12/18 (!) 132/56  02/07/18 135/78  01/19/18 124/82   Good control No changes needed

## 2018-04-12 NOTE — Progress Notes (Signed)
Subjective:    Patient ID: Jacqueline Sanchez, female    DOB: 12-22-1919, 84 y.o.   MRN: [id number]  HPI Visit in [place] apartment for review of chronic health conditions Reviewed status with [name] RN  She is hoarse--"little cold?" No fever No SOB  She has noticed pain in her butt for the past couple of days Awoke in night with legs hurting Has continued "off and on" Still able to walk around with cane or walker Still has help for bathing and dressing  Ongoing pain Uses the oxycodone nightly and did use the prn for this pain Otherwise, hadn't needed that so much Hip had been better till the last couple of day  No chest pain---but has pain across the top of her chest at times (recurrent and positional) No SOB No dizziness or syncope No edema No palpitations  Continues on ARB for CKD  Current Outpatient Medications on File Prior to Visit  Medication Sig Dispense Refill  . acetaminophen (TYLENOL) 325 MG tablet Take 2 tablets (650 mg total) by mouth every 6 (six) hours as needed for mild pain.    Marland Kitchen amLODipine (NORVASC) 10 MG tablet Take 1 tablet (10 mg total) by mouth daily. (Patient taking differently: Take 10 mg by mouth every morning. ) 30 tablet 0  . aspirin EC 81 MG tablet Take 81 mg by mouth every other day.    Marland Kitchen atorvastatin (LIPITOR) 80 MG tablet Take 1 tablet by mouth every evening.     . cetirizine (ZYRTEC) 10 MG tablet Take 10 mg by mouth at bedtime.    . ferrous sulfate 325 (65 FE) MG tablet Take 325 mg by mouth daily with breakfast.    . hydrALAZINE (APRESOLINE) 25 MG tablet Take 1 tablet (25 mg total) by mouth 4 (four) times daily. (Patient taking differently: Take 25 mg by mouth 3 (three) times daily. ) 120 tablet 1  . loratadine (CLARITIN) 10 MG tablet Take 10 mg by mouth daily.    Marland Kitchen losartan (COZAAR) 50 MG tablet Take 1 tablet by mouth every evening.     . Melatonin 3 MG TABS Take 1 tablet by mouth at bedtime.    . metoprolol succinate (TOPROL-XL) 50 MG 24 hr  tablet Take 1 tablet (50 mg total) by mouth daily. Take with or immediately following a meal. (Patient taking differently: Take 50 mg by mouth every morning. Take with or immediately following a meal.) 30 tablet 1  . Multiple Vitamin (MULTI-VITAMINS) TABS Take 1 tablet by mouth daily.    Marland Kitchen oxyCODONE-acetaminophen (PERCOCET/ROXICET) 5-325 MG tablet Take 1 tablet by mouth at bedtime. And 1 tid prn    . polyethylene glycol (MIRALAX / GLYCOLAX) packet Take 17 g by mouth daily.    Marland Kitchen senna (SENOKOT) 8.6 MG tablet Take 2 tablets by mouth 2 (two) times daily.     . vitamin B-12 (CYANOCOBALAMIN) 1000 MCG tablet Take 1,000 mcg by mouth daily.    . Vitamin D, Cholecalciferol, 1000 units CAPS Take 1 capsule by mouth daily.     No current facility-administered medications on file prior to visit.     Allergies  Allergen Reactions  . Morphine And Related Nausea Only    Past Medical History:  Diagnosis Date  . Aphasia 04/2016   Transient---??TIA  . Asthma   . CKD (chronic kidney disease)   . Edema   . Femur fracture, left ([place]) 09/06/2016  . GERD (gastroesophageal reflux disease)   . Hyperlipidemia   . Hypertension   .  Osteoporosis     Past Surgical History:  Procedure Laterality Date  . ANTERIOR HIP REVISION Left 12/01/2016   Procedure: ANTERIOR HIP REVISION, REVISION TO TOTAL;  Surgeon: Hessie Knows, MD;  Location: [facility] ORS;  Service: Orthopedics;  Laterality: Left;  . HIP ARTHROPLASTY Left 09/07/2016   Procedure: ARTHROPLASTY BIPOLAR HIP (HEMIARTHROPLASTY);  Surgeon: Thornton Park, MD;  Location: [facility] ORS;  Service: Orthopedics;  Laterality: Left;  . HIP CLOSED REDUCTION Left 11/03/2016   Procedure: CLOSED REDUCTION HIP;  Surgeon: Thornton Park, MD;  Location: [facility] ORS;  Service: Orthopedics;  Laterality: Left;  . HIP CLOSED REDUCTION Left 11/24/2016   Procedure: CLOSED REDUCTION HIP;  Surgeon: Thornton Park, MD;  Location: [facility] ORS;  Service: Orthopedics;  Laterality: Left;  .  KYPHOPLASTY      Family History  Problem Relation Age of Onset  . Hypertension Other     Social History   Socioeconomic History  . Marital status: Widowed    Spouse name: Not on file  . Number of children: 2  . Years of education: Not on file  . Highest education level: Not on file  Occupational History  . 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 Financial resource strain: Not on file  . Food insecurity:    Worry: Not on file    Inability: Not on file  . Transportation needs:    Medical: Not on file    Non-medical: Not on file  Tobacco Use  . Smoking status: Never Smoker  . Smokeless tobacco: Never Used  Substance and Sexual Activity  . Alcohol use: No  . Drug use: Not on file  . Sexual activity: Never  Lifestyle  . Physical activity:    Days per week: Not on file    Minutes per session: Not on file  . Stress: Not on file  Relationships  . Social connections:    Talks on phone: Not on file    Gets together: Not on file    Attends religious service: Not on file    Active member of club or organization: Not on file    Attends meetings of clubs or organizations: Not on file    Relationship status: Not on file  . Intimate partner violence:    Fear of current or ex partner: Not on file    Emotionally abused: Not on file    Physically abused: Not on file    Forced sexual activity: Not on file  Other Topics Concern  . Not on file  Social History Narrative   Widowed 2000   Has living will   Sons/DILs are health care POAs (mostly Mike/Jan)   Has DNR   No tube feeds if cognitively unaware   Review of Systems Not a big appetite but eats okay Weight up a ittle Sleeps okay in general Bowels are fine on her medications Moles "all over" ---some itching. Nothing suspicioius    Objective:   Physical Exam  Constitutional: She appears well-developed. No distress.  Neck: No thyromegaly present.  Cardiovascular: Normal rate. Exam reveals no  gallop.  Soft systolic murmur  Respiratory: Effort normal and breath sounds normal. No respiratory distress. She has no wheezes. She has no rales.  GI: Soft. There is no tenderness.  Musculoskeletal: She exhibits no edema or tenderness.  Fairly normal ROM in both hips SLR negative  Lymphadenopathy:    She has no cervical adenopathy.           Assessment & Plan:

## 2018-05-02 ENCOUNTER — Encounter: Payer: Self-pay | Admitting: Internal Medicine

## 2018-05-02 ENCOUNTER — Ambulatory Visit: Payer: Medicare PPO | Admitting: Internal Medicine

## 2018-05-02 VITALS — BP 116/58 | HR 69 | Temp 97.9°F | Resp 20 | Wt 121.8 lb

## 2018-05-02 DIAGNOSIS — R053 Chronic cough: Secondary | ICD-10-CM

## 2018-05-02 DIAGNOSIS — B351 Tinea unguium: Secondary | ICD-10-CM

## 2018-05-02 DIAGNOSIS — R05 Cough: Secondary | ICD-10-CM | POA: Diagnosis not present

## 2018-05-02 DIAGNOSIS — M79676 Pain in unspecified toe(s): Secondary | ICD-10-CM | POA: Diagnosis not present

## 2018-05-02 NOTE — Progress Notes (Signed)
Subjective:    Patient ID: Jacqueline Sanchez, female    DOB: [birth year], 84 y.o.   MRN: [id number]  HPI  Asked to see resident in [address] Requesting nail trim. Nails thick, long and getting caught on socks causing pain RN also reports chronic cough. Cough is productive of clear mucous. On Zyrtec and Claritin daily with minimal relief. No runny nose, nasal congestion, ear pain, sore throat. No fever, chills or body aches. RN concerned cough could be coming from Losartan  Review of Systems      Past Medical History:  Diagnosis Date  . Aphasia 04/2016   Transient---??TIA  . Asthma   . CKD (chronic kidney disease)   . Edema   . Femur fracture, left ([place]) 09/06/2016  . GERD (gastroesophageal reflux disease)   . Hyperlipidemia   . Hypertension   . Osteoporosis     Current Outpatient Medications  Medication Sig Dispense Refill  . acetaminophen (TYLENOL) 325 MG tablet Take 2 tablets (650 mg total) by mouth every 6 (six) hours as needed for mild pain.    Marland Kitchen amLODipine (NORVASC) 10 MG tablet Take 1 tablet (10 mg total) by mouth daily. (Patient taking differently: Take 10 mg by mouth every morning. ) 30 tablet 0  . cetirizine (ZYRTEC) 10 MG tablet Take 10 mg by mouth at bedtime.    . ferrous sulfate 325 (65 FE) MG tablet Take 325 mg by mouth daily with breakfast.    . hydrALAZINE (APRESOLINE) 25 MG tablet Take 1 tablet (25 mg total) by mouth 4 (four) times daily. (Patient taking differently: Take 25 mg by mouth 3 (three) times daily. ) 120 tablet 1  . loratadine (CLARITIN) 10 MG tablet Take 10 mg by mouth daily.    Marland Kitchen losartan (COZAAR) 50 MG tablet Take 1 tablet by mouth every evening.     . Melatonin 3 MG TABS Take 1 tablet by mouth at bedtime.    . metoprolol succinate (TOPROL-XL) 50 MG 24 hr tablet Take 1 tablet (50 mg total) by mouth daily. Take with or immediately following a meal. (Patient taking differently: Take 50 mg by mouth every morning. Take with or immediately following a  meal.) 30 tablet 1  . Multiple Vitamin (MULTI-VITAMINS) TABS Take 1 tablet by mouth daily.    Marland Kitchen oxyCODONE-acetaminophen (PERCOCET/ROXICET) 5-325 MG tablet Take 1 tablet by mouth at bedtime. And 1 tid prn    . polyethylene glycol (MIRALAX / GLYCOLAX) packet Take 17 g by mouth daily.    Marland Kitchen senna (SENOKOT) 8.6 MG tablet Take 2 tablets by mouth 2 (two) times daily.     . vitamin B-12 (CYANOCOBALAMIN) 1000 MCG tablet Take 1,000 mcg by mouth daily.    . Vitamin D, Cholecalciferol, 1000 units CAPS Take 1 capsule by mouth daily.     No current facility-administered medications for this visit.     Allergies  Allergen Reactions  . Morphine And Related Nausea Only    Family History  Problem Relation Age of Onset  . Hypertension Other     Social History   Socioeconomic History  . Marital status: Widowed    Spouse name: Not on file  . Number of children: 2  . Years of education: Not on file  . Highest education level: Not on file  Occupational History  . 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 Financial resource strain: Not on file  . Food insecurity:    Worry: Not on file  Inability: Not on file  . Transportation needs:    Medical: Not on file    Non-medical: Not on file  Tobacco Use  . Smoking status: Never Smoker  . Smokeless tobacco: Never Used  Substance and Sexual Activity  . Alcohol use: No  . Drug use: Not on file  . Sexual activity: Never  Lifestyle  . Physical activity:    Days per week: Not on file    Minutes per session: Not on file  . Stress: Not on file  Relationships  . Social connections:    Talks on phone: Not on file    Gets together: Not on file    Attends religious service: Not on file    Active member of club or organization: Not on file    Attends meetings of clubs or organizations: Not on file    Relationship status: Not on file  . Intimate partner violence:    Fear of current or ex partner: Not on file    Emotionally  abused: Not on file    Physically abused: Not on file    Forced sexual activity: Not on file  Other Topics Concern  . Not on file  Social History Narrative   Widowed 2000   Has living will   Sons/DILs are health care POAs (mostly Mike/Jan)   Has DNR   No tube feeds if cognitively unaware     Constitutional: Denies fever, malaise, fatigue, headache or abrupt weight changes.  HEENT: Denies eye pain, eye redness, ear pain, ringing in the ears, wax buildup, runny nose, nasal congestion, bloody nose, or sore throat. Respiratory: Pt reports cough. Denies difficulty breathing, shortness of breath.   Cardiovascular: Denies chest pain, chest tightness, palpitations or swelling in the hands or feet.  Skin: Pt reports fungal toenails.     No other specific complaints in a complete review of systems (except as listed in HPI above).  Objective:   Physical Exam  BP (!) 116/58   Pulse 69   Temp 97.9 F (36.6 C)   Resp 20   Wt 121 lb 12.8 oz (55.2 kg)   BMI 20.91 kg/m  Wt Readings from Last 3 Encounters:  05/02/18 121 lb 12.8 oz (55.2 kg)  04/12/18 131 lb 3.2 oz (59.5 kg)  01/19/18 123 lb (55.8 kg)    General: Appears her stated age, well developed, well nourished in NAD. Skin: Mycotic toenails bilaterally. HEENT: Head: normal shape and size; Throat/Mouth: Teeth present, mucosa pink and moist, no exudate, lesions or ulcerations noted.  Neck:  No adenopathy noted. Pulmonary/Chest: Normal effort and positive vesicular breath sounds. No respiratory distress. No wheezes, rales or ronchi noted.    BMET    Component Value Date/Time   NA 136 12/03/2016 0431   NA 140 05/30/2011 1101   K 4.3 12/03/2016 0431   K 4.3 05/30/2011 1101   CL 102 12/03/2016 0431   CL 99 05/30/2011 1101   CO2 28 12/03/2016 0431   CO2 30 05/30/2011 1101   GLUCOSE 108 (H) 12/03/2016 0431   GLUCOSE 101 (H) 05/30/2011 1101   BUN 18 12/03/2016 0431   BUN 20 (H) 05/30/2011 1101   CREATININE 1.3 (A) 10/09/2017     CREATININE 1.05 (H) 12/03/2016 0431   CREATININE 1.69 (H) 11/18/2011 1535   CALCIUM 10.0 12/03/2016 0431   CALCIUM 11.0 (H) 05/30/2011 1101   GFRNONAA 44 (L) 12/03/2016 0431   GFRNONAA 26 (L) 11/18/2011 1535   GFRAA 51 (L) 12/03/2016 0431  GFRAA 31 (L) 11/18/2011 1535    Lipid Panel  No results found for: CHOL, TRIG, HDL, CHOLHDL, VLDL, LDLCALC  CBC    Component Value Date/Time   WBC 11.3 (H) 12/03/2016 0431   RBC 3.11 (L) 12/03/2016 0431   HGB 10.1 (A) 10/09/2017   HGB 12.7 05/30/2011 1101   HCT 30 (A) 10/09/2017   HCT 37.8 05/30/2011 1101   PLT 266 12/03/2016 0431   PLT 237 05/30/2011 1101   MCV 97.9 12/03/2016 0431   MCV 101 (H) 05/30/2011 1101   MCH 32.9 12/03/2016 0431   MCHC 33.7 12/03/2016 0431   RDW 13.9 12/03/2016 0431   RDW 13.5 05/30/2011 1101   LYMPHSABS 0.7 (L) 09/06/2016 2338   MONOABS 1.0 (H) 09/06/2016 2338   EOSABS 0.0 09/06/2016 2338   BASOSABS 0.0 09/06/2016 2338    Hgb A1C No results found for: HGBA1C          Assessment & Plan:   Pain d/t Mycotic Toenails:  Nails 1-5 bilateral feet trimmed by provider using dremmel tool. Pt tolerated well, no complications  Cough:  BP fine, will hold Losartan x 2 weeks  Continue Zyrtec and Claritin Notify MD if cough does not improve  Will reassess as needed Webb Silversmith, NP

## 2018-05-02 NOTE — Patient Instructions (Signed)

## 2018-06-20 ENCOUNTER — Ambulatory Visit (INDEPENDENT_AMBULATORY_CARE_PROVIDER_SITE_OTHER): Payer: Medicare PPO | Admitting: Internal Medicine

## 2018-06-20 VITALS — BP 151/70 | HR 82 | Temp 98.3°F | Resp 16 | Wt 124.2 lb

## 2018-06-20 DIAGNOSIS — M79675 Pain in left toe(s): Secondary | ICD-10-CM | POA: Diagnosis not present

## 2018-06-20 DIAGNOSIS — M79674 Pain in right toe(s): Secondary | ICD-10-CM

## 2018-06-20 DIAGNOSIS — B351 Tinea unguium: Secondary | ICD-10-CM

## 2018-06-24 ENCOUNTER — Encounter: Payer: Self-pay | Admitting: Internal Medicine

## 2018-06-24 NOTE — Progress Notes (Signed)
Subjective:    Patient ID: Jacqueline Sanchez, female    DOB: 05-07-1920, 85 y.o.   MRN: [id number]  HPI  Asked to see resident in [address] Requesting nail trim. Nails thick and long, getting caught on socks causing pain.   Review of Systems      Past Medical History:  Diagnosis Date  . Aphasia 04/2016   Transient---??TIA  . Asthma   . CKD (chronic kidney disease)   . Edema   . Femur fracture, left ([place]) 09/06/2016  . GERD (gastroesophageal reflux disease)   . Hyperlipidemia   . Hypertension   . Osteoporosis     Current Outpatient Medications  Medication Sig Dispense Refill  . acetaminophen (TYLENOL) 325 MG tablet Take 2 tablets (650 mg total) by mouth every 6 (six) hours as needed for mild pain.    Marland Kitchen amLODipine (NORVASC) 10 MG tablet Take 1 tablet (10 mg total) by mouth daily. (Patient taking differently: Take 10 mg by mouth every morning. ) 30 tablet 0  . cetirizine (ZYRTEC) 10 MG tablet Take 10 mg by mouth at bedtime.    . ferrous sulfate 325 (65 FE) MG tablet Take 325 mg by mouth daily with breakfast.    . hydrALAZINE (APRESOLINE) 25 MG tablet Take 1 tablet (25 mg total) by mouth 4 (four) times daily. (Patient taking differently: Take 25 mg by mouth 3 (three) times daily. ) 120 tablet 1  . loratadine (CLARITIN) 10 MG tablet Take 10 mg by mouth daily.    Marland Kitchen losartan (COZAAR) 50 MG tablet Take 1 tablet by mouth every evening.     . Melatonin 3 MG TABS Take 1 tablet by mouth at bedtime.    . metoprolol succinate (TOPROL-XL) 50 MG 24 hr tablet Take 1 tablet (50 mg total) by mouth daily. Take with or immediately following a meal. (Patient taking differently: Take 50 mg by mouth every morning. Take with or immediately following a meal.) 30 tablet 1  . Multiple Vitamin (MULTI-VITAMINS) TABS Take 1 tablet by mouth daily.    Marland Kitchen oxyCODONE-acetaminophen (PERCOCET/ROXICET) 5-325 MG tablet Take 1 tablet by mouth at bedtime. And 1 tid prn    . polyethylene glycol (MIRALAX / GLYCOLAX)  packet Take 17 g by mouth daily.    Marland Kitchen senna (SENOKOT) 8.6 MG tablet Take 2 tablets by mouth 2 (two) times daily.     . vitamin B-12 (CYANOCOBALAMIN) 1000 MCG tablet Take 1,000 mcg by mouth daily.    . Vitamin D, Cholecalciferol, 1000 units CAPS Take 1 capsule by mouth daily.     No current facility-administered medications for this visit.     Allergies  Allergen Reactions  . Morphine And Related Nausea Only    Family History  Problem Relation Age of Onset  . Hypertension Other     Social History   Socioeconomic History  . Marital status: Widowed    Spouse name: Not on file  . Number of children: 2  . Years of education: Not on file  . Highest education level: Not on file  Occupational History  . 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 Financial resource strain: Not on file  . Food insecurity:    Worry: Not on file    Inability: Not on file  . Transportation needs:    Medical: Not on file    Non-medical: Not on file  Tobacco Use  . Smoking status: Never Smoker  . Smokeless tobacco: Never Used  Substance  and Sexual Activity  . Alcohol use: No  . Drug use: Not on file  . Sexual activity: Never  Lifestyle  . Physical activity:    Days per week: Not on file    Minutes per session: Not on file  . Stress: Not on file  Relationships  . Social connections:    Talks on phone: Not on file    Gets together: Not on file    Attends religious service: Not on file    Active member of club or organization: Not on file    Attends meetings of clubs or organizations: Not on file    Relationship status: Not on file  . Intimate partner violence:    Fear of current or ex partner: Not on file    Emotionally abused: Not on file    Physically abused: Not on file    Forced sexual activity: Not on file  Other Topics Concern  . Not on file  Social History Narrative   Widowed 2000   Has living will   Sons/DILs are health care POAs (mostly Mike/Jan)    Has DNR   No tube feeds if cognitively unaware     Constitutional: Denies fever, malaise, fatigue, headache or abrupt weight changes.  Skin: Pt reports long, thick discolored toenails.    No other specific complaints in a complete review of systems (except as listed in HPI above).  Objective:   Physical Exam   BP (!) 151/70   Pulse 82   Temp 98.3 F (36.8 C)   Resp 16   Wt 124 lb 3.2 oz (56.3 kg)   BMI 21.32 kg/m  Wt Readings from Last 3 Encounters:  06/24/18 124 lb 3.2 oz (56.3 kg)  05/02/18 121 lb 12.8 oz (55.2 kg)  04/12/18 131 lb 3.2 oz (59.5 kg)    General: Appears her stated age, well developed, well nourished in NAD. Skin: Mycotic toenails bilaterally.  BMET    Component Value Date/Time   NA 136 12/03/2016 0431   NA 140 05/30/2011 1101   K 4.3 12/03/2016 0431   K 4.3 05/30/2011 1101   CL 102 12/03/2016 0431   CL 99 05/30/2011 1101   CO2 28 12/03/2016 0431   CO2 30 05/30/2011 1101   GLUCOSE 108 (H) 12/03/2016 0431   GLUCOSE 101 (H) 05/30/2011 1101   BUN 18 12/03/2016 0431   BUN 20 (H) 05/30/2011 1101   CREATININE 1.3 (A) 10/09/2017   CREATININE 1.05 (H) 12/03/2016 0431   CREATININE 1.69 (H) 11/18/2011 1535   CALCIUM 10.0 12/03/2016 0431   CALCIUM 11.0 (H) 05/30/2011 1101   GFRNONAA 44 (L) 12/03/2016 0431   GFRNONAA 26 (L) 11/18/2011 1535   GFRAA 51 (L) 12/03/2016 0431   GFRAA 31 (L) 11/18/2011 1535    Lipid Panel  No results found for: CHOL, TRIG, HDL, CHOLHDL, VLDL, LDLCALC  CBC    Component Value Date/Time   WBC 11.3 (H) 12/03/2016 0431   RBC 3.11 (L) 12/03/2016 0431   HGB 10.1 (A) 10/09/2017   HGB 12.7 05/30/2011 1101   HCT 30 (A) 10/09/2017   HCT 37.8 05/30/2011 1101   PLT 266 12/03/2016 0431   PLT 237 05/30/2011 1101   MCV 97.9 12/03/2016 0431   MCV 101 (H) 05/30/2011 1101   MCH 32.9 12/03/2016 0431   MCHC 33.7 12/03/2016 0431   RDW 13.9 12/03/2016 0431   RDW 13.5 05/30/2011 1101   LYMPHSABS 0.7 (L) 09/06/2016 2338   MONOABS  1.0 (H) 09/06/2016 2338  EOSABS 0.0 09/06/2016 2338   BASOSABS 0.0 09/06/2016 2338    Hgb A1C No results found for: HGBA1C         Assessment & Plan:   Pain due to Mycotic Toenails:  Nails 1-5 b/l feet trimmed by provider using dremmel tool Pt tolerated well No complications  Return precautions discussed Webb Silversmith, NP

## 2018-06-24 NOTE — Patient Instructions (Signed)
Fungal Nail Infection A fungal nail infection is a common infection of the toenails or fingernails. This condition affects toenails more often than fingernails. It often affects the great, or big, toes. More than one nail may be infected. The condition can be passed from person to person (is contagious). What are the causes? This condition is caused by a fungus. Several types of fungi can cause the infection. These fungi are common in moist and warm areas. If your hands or feet come into contact with the fungus, it may get into a crack in your fingernail or toenail and cause the infection. What increases the risk? The following factors may make you more likely to develop this condition:  Being female.  Being of older age.  Living with someone who has the fungus.  Walking barefoot in areas where the fungus thrives, such as showers or locker rooms.  Wearing shoes and socks that cause your feet to sweat.  Having a nail injury or a recent nail surgery.  Having certain medical conditions, such as: ? Athlete's foot. ? Diabetes. ? Psoriasis. ? Poor circulation. ? A weak body defense system (immune system). What are the signs or symptoms? Symptoms of this condition include:  A pale spot on the nail.  Thickening of the nail.  A nail that becomes yellow or brown.  A brittle or ragged nail edge.  A crumbling nail.  A nail that has lifted away from the nail bed. How is this diagnosed? This condition is diagnosed with a physical exam. Your health care provider may take a scraping or clipping from your nail to test for the fungus. How is this treated? Treatment is not needed for mild infections. If you have significant nail changes, treatment may include:  Antifungal medicines taken by mouth (orally). You may need to take the medicine for several weeks or several months, and you may not see the results for a long time. These medicines can cause side effects. Ask your health care provider  what problems to watch for.  Antifungal nail polish or nail cream. These may be used along with oral antifungal medicines.  Laser treatment of the nail.  Surgery to remove the nail. This may be needed for the most severe infections. It can take a long time, usually up to a year, for the infection to go away. The infection may also come back. Follow these instructions at home: Medicines  Take or apply over-the-counter and prescription medicines only as told by your health care provider.  Ask your health care provider about using over-the-counter mentholated ointment on your nails. Nail care  Trim your nails often.  Wash and dry your hands and feet every day.  Keep your feet dry: ? Wear absorbent socks, and change your socks frequently. ? Wear shoes that allow air to circulate, such as sandals or canvas tennis shoes. Throw out old shoes.  Do not use artificial nails.  If you go to a nail salon, make sure you choose one that uses clean instruments.  Use antifungal foot powder on your feet and in your shoes. General instructions  Do not share personal items, such as towels or nail clippers.  Do not walk barefoot in shower rooms or locker rooms.  Wear rubber gloves if you are working with your hands in wet areas.  Keep all follow-up visits as told by your health care provider. This is important. Contact a health care provider if: Your infection is not getting better or it is getting worse   after several months. Summary  A fungal nail infection is a common infection of the toenails or fingernails.  Treatment is not needed for mild infections. If you have significant nail changes, treatment may include taking medicine orally and applying medicine to your nails.  It can take a long time, usually up to a year, for the infection to go away. The infection may also come back.  Take or apply over-the-counter and prescription medicines only as told by your health care  provider.  Follow instructions for taking care of your nails to help prevent infection from coming back or spreading. This information is not intended to replace advice given to you by your health care provider. Make sure you discuss any questions you have with your health care provider. Document Released: 05/06/2000 Document Revised: 10/13/2017 Document Reviewed: 10/13/2017 Elsevier Interactive Patient Education  2019 [employer] Inc[employer]  

## 2018-06-27 ENCOUNTER — Ambulatory Visit: Payer: Medicare PPO | Admitting: Internal Medicine

## 2018-06-27 ENCOUNTER — Encounter: Payer: Self-pay | Admitting: Internal Medicine

## 2018-06-27 VITALS — BP 173/75 | HR 90 | Temp 98.8°F | Resp 20 | Wt 123.3 lb

## 2018-06-27 DIAGNOSIS — M25551 Pain in right hip: Secondary | ICD-10-CM | POA: Diagnosis not present

## 2018-06-27 DIAGNOSIS — M25552 Pain in left hip: Secondary | ICD-10-CM | POA: Diagnosis not present

## 2018-06-27 NOTE — Progress Notes (Signed)
Subjective:    Patient ID: Jacqueline Sanchez, female    DOB: 1920-04-11, 85 y.o.   MRN: [id number]  HPI  Asked to see resident in [address] C/o bilateral hip pain x 2 -3 days She describes the pain as sharp and stabbing The pain is worse with movement, better with laying still The pain does not radiate She denies numbness, tingling or weakness She denies loss of bowel or bladder control She has not gotten out of bed much the past 2 days She denies recent fall She has a history of left THA, with revisions She has been taking Percocet and Zanaflex with some relief.  Review of Systems      Past Medical History:  Diagnosis Date  . Aphasia 04/2016   Transient---??TIA  . Asthma   . CKD (chronic kidney disease)   . Edema   . Femur fracture, left ([place]) 09/06/2016  . GERD (gastroesophageal reflux disease)   . Hyperlipidemia   . Hypertension   . Osteoporosis     Current Outpatient Medications  Medication Sig Dispense Refill  . acetaminophen (TYLENOL) 325 MG tablet Take 2 tablets (650 mg total) by mouth every 6 (six) hours as needed for mild pain.    Marland Kitchen amLODipine (NORVASC) 10 MG tablet Take 1 tablet (10 mg total) by mouth daily. (Patient taking differently: Take 10 mg by mouth every morning. ) 30 tablet 0  . cetirizine (ZYRTEC) 10 MG tablet Take 10 mg by mouth at bedtime.    . ferrous sulfate 325 (65 FE) MG tablet Take 325 mg by mouth daily with breakfast.    . hydrALAZINE (APRESOLINE) 25 MG tablet Take 1 tablet (25 mg total) by mouth 4 (four) times daily. (Patient taking differently: Take 25 mg by mouth 3 (three) times daily. ) 120 tablet 1  . loratadine (CLARITIN) 10 MG tablet Take 10 mg by mouth daily.    Marland Kitchen losartan (COZAAR) 50 MG tablet Take 1 tablet by mouth every evening.     . Melatonin 3 MG TABS Take 1 tablet by mouth at bedtime.    . metoprolol succinate (TOPROL-XL) 50 MG 24 hr tablet Take 1 tablet (50 mg total) by mouth daily. Take with or immediately following a meal.  (Patient taking differently: Take 50 mg by mouth every morning. Take with or immediately following a meal.) 30 tablet 1  . Multiple Vitamin (MULTI-VITAMINS) TABS Take 1 tablet by mouth daily.    Marland Kitchen oxyCODONE-acetaminophen (PERCOCET/ROXICET) 5-325 MG tablet Take 1 tablet by mouth at bedtime. And 1 tid prn    . polyethylene glycol (MIRALAX / GLYCOLAX) packet Take 17 g by mouth daily.    Marland Kitchen senna (SENOKOT) 8.6 MG tablet Take 2 tablets by mouth 2 (two) times daily.     . vitamin B-12 (CYANOCOBALAMIN) 1000 MCG tablet Take 1,000 mcg by mouth daily.    . Vitamin D, Cholecalciferol, 1000 units CAPS Take 1 capsule by mouth daily.     No current facility-administered medications for this visit.     Allergies  Allergen Reactions  . Morphine And Related Nausea Only    Family History  Problem Relation Age of Onset  . Hypertension Other     Social History   Socioeconomic History  . Marital status: Widowed    Spouse name: Not on file  . Number of children: 2  . Years of education: Not on file  . Highest education level: Not on file  Occupational History  . 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 Financial resource strain: Not on file  . Food insecurity:    Worry: Not on file    Inability: Not on file  . Transportation needs:    Medical: Not on file    Non-medical: Not on file  Tobacco Use  . Smoking status: Never Smoker  . Smokeless tobacco: Never Used  Substance and Sexual Activity  . Alcohol use: No  . Drug use: Not on file  . Sexual activity: Never  Lifestyle  . Physical activity:    Days per week: Not on file    Minutes per session: Not on file  . Stress: Not on file  Relationships  . Social connections:    Talks on phone: Not on file    Gets together: Not on file    Attends religious service: Not on file    Active member of club or organization: Not on file    Attends meetings of clubs or organizations: Not on file    Relationship status: Not  on file  . Intimate partner violence:    Fear of current or ex partner: Not on file    Emotionally abused: Not on file    Physically abused: Not on file    Forced sexual activity: Not on file  Other Topics Concern  . Not on file  Social History Narrative   Widowed 2000   Has living will   Sons/DILs are health care POAs (mostly Mike/Jan)   Has DNR   No tube feeds if cognitively unaware     Constitutional: Denies fever, malaise, fatigue, headache or abrupt weight changes.  Gastrointestinal: Denies abdominal pain, bloating, constipation, diarrhea or blood in the stool.  GU: Denies urgency, frequency, pain with urination, burning sensation, blood in urine, odor or discharge. Musculoskeletal: Pt reports bilateral hip pain, difficulty with gait. Denies decrease in range of motion, muscle pain or joint swelling.  Skin: Denies redness, rashes, lesions or ulcercations.    No other specific complaints in a complete review of systems (except as listed in HPI above).  Objective:   Physical Exam   BP (!) 173/75   Pulse 90   Temp 98.8 F (37.1 C)   Resp 20   Wt 123 lb 4.8 oz (55.9 kg)   BMI 21.16 kg/m  Wt Readings from Last 3 Encounters:  06/27/18 123 lb 4.8 oz (55.9 kg)  06/24/18 124 lb 3.2 oz (56.3 kg)  05/02/18 121 lb 12.8 oz (55.2 kg)    General: Appears her stated age, in NAD. Skin: Warm, dry and intact. No rashes noted. Musculoskeletal: Normal abduction and adduction of the hips. She has pain with internal rotation bilaterally. Normal external rotation. Pain with palpation over both SI joints and trochanteric bursa. Gait not visualized. Neurological: Alert and oriented. Sensation intact to BLE.   BMET    Component Value Date/Time   NA 136 12/03/2016 0431   NA 140 05/30/2011 1101   K 4.3 12/03/2016 0431   K 4.3 05/30/2011 1101   CL 102 12/03/2016 0431   CL 99 05/30/2011 1101   CO2 28 12/03/2016 0431   CO2 30 05/30/2011 1101   GLUCOSE 108 (H) 12/03/2016 0431    GLUCOSE 101 (H) 05/30/2011 1101   BUN 18 12/03/2016 0431   BUN 20 (H) 05/30/2011 1101   CREATININE 1.3 (A) 10/09/2017   CREATININE 1.05 (H) 12/03/2016 0431   CREATININE 1.69 (H) 11/18/2011 1535   CALCIUM 10.0 12/03/2016 0431   CALCIUM 11.0 (H)  05/30/2011 1101   GFRNONAA 44 (L) 12/03/2016 0431   GFRNONAA 26 (L) 11/18/2011 1535   GFRAA 51 (L) 12/03/2016 0431   GFRAA 31 (L) 11/18/2011 1535    Lipid Panel  No results found for: CHOL, TRIG, HDL, CHOLHDL, VLDL, LDLCALC  CBC    Component Value Date/Time   WBC 11.3 (H) 12/03/2016 0431   RBC 3.11 (L) 12/03/2016 0431   HGB 10.1 (A) 10/09/2017   HGB 12.7 05/30/2011 1101   HCT 30 (A) 10/09/2017   HCT 37.8 05/30/2011 1101   PLT 266 12/03/2016 0431   PLT 237 05/30/2011 1101   MCV 97.9 12/03/2016 0431   MCV 101 (H) 05/30/2011 1101   MCH 32.9 12/03/2016 0431   MCHC 33.7 12/03/2016 0431   RDW 13.9 12/03/2016 0431   RDW 13.5 05/30/2011 1101   LYMPHSABS 0.7 (L) 09/06/2016 2338   MONOABS 1.0 (H) 09/06/2016 2338   EOSABS 0.0 09/06/2016 2338   BASOSABS 0.0 09/06/2016 2338    Hgb A1C No results found for: HGBA1C         Assessment & Plan:   Bilateral Hip Pain:  Will trial Pred Taper x 6 days Continue Percocent and Zanaflex as needed If worsens, will obtain xray bilateral hips Consider PT as well  Will reassess as needed Webb Silversmith, NP

## 2018-06-27 NOTE — Patient Instructions (Signed)

## 2018-06-29 DIAGNOSIS — M25551 Pain in right hip: Secondary | ICD-10-CM | POA: Diagnosis not present

## 2018-06-29 DIAGNOSIS — M25552 Pain in left hip: Secondary | ICD-10-CM | POA: Diagnosis not present

## 2018-07-03 DIAGNOSIS — M545 Low back pain: Secondary | ICD-10-CM | POA: Diagnosis not present

## 2018-07-05 ENCOUNTER — Emergency Department
Admission: EM | Admit: 2018-07-05 | Discharge: 2018-07-05 | Disposition: A | Discharge status: Home / Self Care | Payer: Medicare PPO | Attending: Emergency Medicine | Admitting: Emergency Medicine

## 2018-07-05 ENCOUNTER — Encounter: Payer: Self-pay | Admitting: Emergency Medicine

## 2018-07-05 ENCOUNTER — Other Ambulatory Visit: Payer: Self-pay

## 2018-07-05 DIAGNOSIS — N183 Chronic kidney disease, stage 3 (moderate): Secondary | ICD-10-CM | POA: Diagnosis not present

## 2018-07-05 DIAGNOSIS — Z79899 Other long term (current) drug therapy: Secondary | ICD-10-CM | POA: Insufficient documentation

## 2018-07-05 DIAGNOSIS — M25551 Pain in right hip: Secondary | ICD-10-CM | POA: Insufficient documentation

## 2018-07-05 DIAGNOSIS — J45909 Unspecified asthma, uncomplicated: Secondary | ICD-10-CM | POA: Diagnosis not present

## 2018-07-05 DIAGNOSIS — I129 Hypertensive chronic kidney disease with stage 1 through stage 4 chronic kidney disease, or unspecified chronic kidney disease: Secondary | ICD-10-CM | POA: Diagnosis not present

## 2018-07-05 DIAGNOSIS — M25552 Pain in left hip: Secondary | ICD-10-CM | POA: Diagnosis not present

## 2018-07-05 MED ORDER — GABAPENTIN 100 MG PO CAPS: 100.0000 mg | ORAL_CAPSULE | Freq: Three times a day (TID) | ORAL | 0 refills | Status: DC

## 2018-07-05 MED ORDER — GABAPENTIN 100 MG PO CAPS
100.0000 mg | ORAL_CAPSULE | Freq: Once | ORAL | Status: AC
  Administered 2018-07-05: 100 mg via ORAL
  Filled 2018-07-05 (×2): qty 1

## 2018-07-05 NOTE — Discharge Instructions (Addendum)
Please follow up with your scheduled orthopedic appointment. Please continue to take your pain medication. It might help to take scheduled tylenol as well not just when you need it (ie Tylenol 1000 mg every 8 hours). Please seek medical attention for any high fevers, chest pain, shortness of breath, change in behavior, persistent vomiting, bloody stool or any other new or concerning symptoms.

## 2018-07-05 NOTE — ED Notes (Signed)
This RN gave report to [place] at [facility]. Pt's paper prescription and DNR given to son. Pt assisted to the wheelchair and taken to lobby by this RN. Pt and son deny comments/concerns regarding D/C instructions and F/U appt.

## 2018-07-05 NOTE — ED Provider Notes (Addendum)
[facility] Emergency Department Provider Note ____________________________________________   I have reviewed the triage vital signs and the nursing notes.   HISTORY  Chief Complaint Hip Pain   History limited by: Not Limited   HPI Jacqueline Sanchez is a 85 y.o. female who presents to the emergency department today for pain control. The patient has been having bilateral hip pain for 2-3 weeks. It came on acutely. She denies any trauma. She has seen her primary care provider for this and was given a steroid taper. She denies any significant relief. She says the pain is worse with movement however she has still been able to ambulate with her walker. She denies any pain or radiation down her leg. The patient denies any change in urination or defecation. Denies any fevers. The patient did have x-rays done of her hips and lower back without any acute findings.   Per medical record review patient has a history of CKD, GERD, HLD, HTN.   Past Medical History:  Diagnosis Date  . Aphasia 04/2016   Transient---??TIA  . Asthma   . CKD (chronic kidney disease)   . Edema   . Femur fracture, left ([place]) 09/06/2016  . GERD (gastroesophageal reflux disease)   . Hyperlipidemia   . Hypertension   . Osteoporosis     Patient Active Problem List   Diagnosis Date Noted  . Muscular aches 04/12/2018  . Allergic rhinitis 10/12/2017  . GERD (gastroesophageal reflux disease) 07/28/2017  . Osteoporosis 07/28/2017  . Narcotic dependence ([place]) 04/20/2017  . Valvular heart disease 04/20/2017  . HLD (hyperlipidemia) 10/12/2016  . Essential hypertension, benign 06/23/2016  . Generalized osteoarthritis of multiple sites 06/23/2016  . CKD (chronic kidney disease), stage III ([place]) 04/21/2016    Past Surgical History:  Procedure Laterality Date  . ANTERIOR HIP REVISION Left 12/01/2016   Procedure: ANTERIOR HIP REVISION, REVISION TO TOTAL;  Surgeon: Hessie Knows, MD;  Location: [facility]  ORS;  Service: Orthopedics;  Laterality: Left;  . HIP ARTHROPLASTY Left 09/07/2016   Procedure: ARTHROPLASTY BIPOLAR HIP (HEMIARTHROPLASTY);  Surgeon: Thornton Park, MD;  Location: [facility] ORS;  Service: Orthopedics;  Laterality: Left;  . HIP CLOSED REDUCTION Left 11/03/2016   Procedure: CLOSED REDUCTION HIP;  Surgeon: Thornton Park, MD;  Location: [facility] ORS;  Service: Orthopedics;  Laterality: Left;  . HIP CLOSED REDUCTION Left 11/24/2016   Procedure: CLOSED REDUCTION HIP;  Surgeon: Thornton Park, MD;  Location: [facility] ORS;  Service: Orthopedics;  Laterality: Left;  . KYPHOPLASTY      Prior to Admission medications   Medication Sig Start Date End Date Taking? Authorizing Provider  acetaminophen (TYLENOL) 325 MG tablet Take 2 tablets (650 mg total) by mouth every 6 (six) hours as needed for mild pain. 09/10/16   Henreitta Leber, MD  amLODipine (NORVASC) 10 MG tablet Take 1 tablet (10 mg total) by mouth daily. Patient taking differently: Take 10 mg by mouth every morning.  04/23/16   Fritzi Mandes, MD  cetirizine (ZYRTEC) 10 MG tablet Take 10 mg by mouth at bedtime.    [provider]  ferrous sulfate 325 (65 FE) MG tablet Take 325 mg by mouth daily with breakfast.    [provider]  hydrALAZINE (APRESOLINE) 25 MG tablet Take 1 tablet (25 mg total) by mouth 4 (four) times daily. Patient taking differently: Take 25 mg by mouth 3 (three) times daily.  04/23/16   Fritzi Mandes, MD  loratadine (CLARITIN) 10 MG tablet Take 10 mg by mouth daily.  [provider]  losartan (COZAAR) 50 MG tablet Take 1 tablet by mouth every evening.  03/08/16   [provider]  Melatonin 3 MG TABS Take 1 tablet by mouth at bedtime.    [provider]  metoprolol succinate (TOPROL-XL) 50 MG 24 hr tablet Take 1 tablet (50 mg total) by mouth daily. Take with or immediately following a meal. Patient taking differently: Take 50 mg by mouth every morning. Take with or immediately  following a meal. 04/23/16   Fritzi Mandes, MD  Multiple Vitamin (MULTI-VITAMINS) TABS Take 1 tablet by mouth daily.    [provider]  oxyCODONE-acetaminophen (PERCOCET/ROXICET) 5-325 MG tablet Take 1 tablet by mouth at bedtime. And 1 tid prn    [provider]  polyethylene glycol (MIRALAX / GLYCOLAX) packet Take 17 g by mouth daily.    [provider]  senna (SENOKOT) 8.6 MG tablet Take 2 tablets by mouth 2 (two) times daily.     [provider]  vitamin B-12 (CYANOCOBALAMIN) 1000 MCG tablet Take 1,000 mcg by mouth daily.    [provider]  Vitamin D, Cholecalciferol, 1000 units CAPS Take 1 capsule by mouth daily.    [provider]    Allergies Penicillins and Morphine and related  Family History  Problem Relation Age of Onset  . Hypertension Other     Social History Social History   Tobacco Use  . Smoking status: Never Smoker  . Smokeless tobacco: Never Used  Substance Use Topics  . Alcohol use: No  . Drug use: Not on file    Review of Systems Constitutional: No fever/chills Eyes: No visual changes. ENT: No sore throat. Cardiovascular: Denies chest pain. Respiratory: Denies shortness of breath. Gastrointestinal: No abdominal pain.  No nausea, no vomiting.  No diarrhea.   Genitourinary: Negative for dysuria. Musculoskeletal: Positive for bilateral hip pain, right greater than left.  Skin: Negative for rash. Neurological: Negative for headaches, focal weakness or numbness.  ____________________________________________   PHYSICAL EXAM:  VITAL SIGNS: ED Triage Vitals [07/05/18 1104]  Enc Vitals Group     BP (!) 146/56     Pulse Rate 89     Resp 18     Temp 98.2 F (36.8 C)     Temp Source Oral     SpO2 99 %     Weight      Height      Head Circumference      Peak Flow      Pain Score 8   Constitutional: Alert and oriented.  Eyes: Conjunctivae are normal.  ENT      Head: Normocephalic and  atraumatic.      Nose: No congestion/rhinnorhea.      Mouth/Throat: Mucous membranes are moist.      Neck: No stridor. Hematological/Lymphatic/Immunilogical: No cervical lymphadenopathy. Cardiovascular: Normal rate, regular rhythm.  Systolic murmur.  Respiratory: Normal respiratory effort without tachypnea nor retractions. Breath sounds are clear and equal bilaterally. No wheezes/rales/rhonchi. Gastrointestinal: Soft and non tender. No rebound. No guarding.  Genitourinary: Deferred Musculoskeletal: Normal range of motion in all extremities. No lower extremity edema. Mild tenderness to palpation of right hip. No tenderness with manipulation of either hip.  Neurologic:  Normal speech and language. No gross focal neurologic deficits are appreciated.  Skin:  Skin is warm, dry and intact. No rash noted. Psychiatric: Mood and affect are normal. Speech and behavior are normal. Patient exhibits appropriate insight and judgment.  ____________________________________________    LABS (pertinent positives/negatives)  None  ____________________________________________   EKG  None  ____________________________________________    RADIOLOGY  None  ____________________________________________   PROCEDURES  Procedures  ____________________________________________   INITIAL IMPRESSION / ASSESSMENT AND PLAN / ED COURSE  Pertinent labs & imaging results that were available during my care of the patient were reviewed by me and considered in my medical decision making (see chart for details).   Patient presented to the emergency department today because of concerns for pain control.  Patient has now had bilateral hip pain for the past 2 to 3 weeks right greater than left.  Had x-rays which were negative for acute fracture.  On exam patient has some very mild tenderness around the right hip although no tenderness with manipulation of the hips.  Patient without any urinary or defecation  changes, no change in baseline ambulation or numbness or tingling in the lower extremities.  At this point I have extremely low suspicion for occult fracture.  Additionally I have a low suspicion for central cord lesion given lack of other findings.  Do wonder if patient has some bursitis.  Patient does have follow-up appointment already scheduled in 5 days with orthopedics.  I think this is an appropriate plan for the patient. Will trial patient on gabapentin for further pain control.   ____________________________________________   FINAL CLINICAL IMPRESSION(S) / ED DIAGNOSES  Final diagnoses:  Pain of both hip joints     Note: This dictation was prepared with Dragon dictation. Any transcriptional errors that result from this process are unintentional     Nance Pear, MD 07/05/18 1154    Nance Pear, MD 07/05/18 1225

## 2018-07-05 NOTE — ED Notes (Signed)
Pt presents to ED from [facility] with c/o bilateral hip pain x several weeks. Per letter from nursing home pt with significant pain to buttocks, sacral area, and upper legs that has limited mobility and has not been treated with Oxycodone and Xanaflex. Pt denies known injury to legs.

## 2018-07-25 ENCOUNTER — Ambulatory Visit: Payer: Medicare PPO | Admitting: Internal Medicine

## 2018-07-25 VITALS — BP 126/59 | HR 72 | Temp 98.4°F | Resp 18 | Wt 119.4 lb

## 2018-07-25 DIAGNOSIS — E7800 Pure hypercholesterolemia, unspecified: Secondary | ICD-10-CM

## 2018-07-25 DIAGNOSIS — K219 Gastro-esophageal reflux disease without esophagitis: Secondary | ICD-10-CM

## 2018-07-25 DIAGNOSIS — M816 Localized osteoporosis [Lequesne]: Secondary | ICD-10-CM

## 2018-07-25 DIAGNOSIS — M159 Polyosteoarthritis, unspecified: Secondary | ICD-10-CM

## 2018-07-25 DIAGNOSIS — N183 Chronic kidney disease, stage 3 unspecified: Secondary | ICD-10-CM

## 2018-07-25 DIAGNOSIS — Z8673 Personal history of transient ischemic attack (TIA), and cerebral infarction without residual deficits: Secondary | ICD-10-CM | POA: Diagnosis not present

## 2018-07-25 DIAGNOSIS — B029 Zoster without complications: Secondary | ICD-10-CM

## 2018-07-25 DIAGNOSIS — I10 Essential (primary) hypertension: Secondary | ICD-10-CM | POA: Diagnosis not present

## 2018-07-31 ENCOUNTER — Other Ambulatory Visit: Payer: Self-pay | Admitting: Orthopedic Surgery

## 2018-07-31 DIAGNOSIS — S32591A Other specified fracture of right pubis, initial encounter for closed fracture: Secondary | ICD-10-CM

## 2018-07-31 DIAGNOSIS — M1611 Unilateral primary osteoarthritis, right hip: Secondary | ICD-10-CM

## 2018-08-03 ENCOUNTER — Encounter: Payer: Self-pay | Admitting: Internal Medicine

## 2018-08-03 NOTE — Assessment & Plan Note (Signed)
Continue Gabapentin and Percocet She will continue to follow with ortho

## 2018-08-03 NOTE — Assessment & Plan Note (Signed)
No issues off statin therapy

## 2018-08-03 NOTE — Assessment & Plan Note (Signed)
Continue Calcium and Vit D Encouraged daily weight bearing exercise

## 2018-08-03 NOTE — Assessment & Plan Note (Signed)
Continue Amlodipine and Metoprolol Will monitor

## 2018-08-03 NOTE — Assessment & Plan Note (Signed)
Will monitor kidney function yearly

## 2018-08-03 NOTE — Progress Notes (Addendum)
Subjective:    Patient ID: Jacqueline Sanchez, female    DOB: 04/05/1920, 85 y.o.   MRN: [id number]  HPI  Resident's seen in [address] for routine follow-up Reviewed with RN, no new concerns Resident concerned about falling but denies results Sleeps well, recently needing some assist with ADLs.  Walks with walker. Appetite good.  Weight down 3 pounds over 2 months. Denies urinary leakage.  Bowels okay. She complains of bilateral hip pain at times.  Denies reflux or shortness of breath  Osteoporosis: Currently only taking calcium and vitamin D.  There is no bone density for review in Epic.  HTN: Her BP today is 126/59.  She taking Amlodipine and Metoprolol as prescribed.  HLD: Currently not on a statin.  She tries to consume a low-fat diet.  GERD: Currently not an issue.  She is not taking any antacid medication for this.  Hx of TIA: Mild functional needs.  Continue Amlodipine, Metoprolol.  CKD: Her last creatinine was 1.3.  She is currently not on ACEI/ARB.  OA: s/p hip arthroplasty.  Pain controlled with Gabapentin and Percocet.  She continues to follow with Ortho.  Review of Systems      Past Medical History:  Diagnosis Date  . Aphasia 04/2016   Transient---??TIA  . Asthma   . CKD (chronic kidney disease)   . Edema   . Femur fracture, left ([place]) 09/06/2016  . GERD (gastroesophageal reflux disease)   . Hyperlipidemia   . Hypertension   . Osteoporosis     Current Outpatient Medications  Medication Sig Dispense Refill  . amLODipine (NORVASC) 10 MG tablet Take 1 tablet (10 mg total) by mouth daily. (Patient taking differently: Take 10 mg by mouth every morning. ) 30 tablet 0  . cetirizine (ZYRTEC) 10 MG tablet Take 10 mg by mouth at bedtime.    . ferrous sulfate 325 (65 FE) MG tablet Take 325 mg by mouth daily with breakfast.    . gabapentin (NEURONTIN) 100 MG capsule Take 1 capsule (100 mg total) by mouth 3 (three) times daily. 30 capsule 0  . hydrALAZINE  (APRESOLINE) 25 MG tablet Take 1 tablet (25 mg total) by mouth 4 (four) times daily. (Patient taking differently: Take 25 mg by mouth 3 (three) times daily. ) 120 tablet 1  . loratadine (CLARITIN) 10 MG tablet Take 10 mg by mouth daily.    . Melatonin 3 MG TABS Take 1 tablet by mouth at bedtime.    . metoprolol succinate (TOPROL-XL) 50 MG 24 hr tablet Take 1 tablet (50 mg total) by mouth daily. Take with or immediately following a meal. (Patient taking differently: Take 50 mg by mouth every morning. Take with or immediately following a meal.) 30 tablet 1  . Multiple Vitamin (MULTI-VITAMINS) TABS Take 1 tablet by mouth daily.    Marland Kitchen oxyCODONE-acetaminophen (PERCOCET/ROXICET) 5-325 MG tablet Take 1 tablet by mouth at bedtime. And 1 tid prn    . polyethylene glycol (MIRALAX / GLYCOLAX) packet Take 17 g by mouth daily.    Marland Kitchen senna (SENOKOT) 8.6 MG tablet Take 2 tablets by mouth 2 (two) times daily.     . vitamin B-12 (CYANOCOBALAMIN) 1000 MCG tablet Take 1,000 mcg by mouth daily.    . Vitamin D, Cholecalciferol, 1000 units CAPS Take 1 capsule by mouth daily.    Marland Kitchen acetaminophen (TYLENOL) 325 MG tablet Take 2 tablets (650 mg total) by mouth every 6 (six) hours as needed for mild pain. (Patient not taking: Reported on  08/03/2018)     No current facility-administered medications for this visit.     Allergies  Allergen Reactions  . Penicillins     Many many many years ago   . Morphine And Related Nausea Only    Family History  Problem Relation Age of Onset  . Hypertension Other     Social History   Socioeconomic History  . Marital status: Widowed    Spouse name: Not on file  . Number of children: 2  . Years of education: Not on file  . Highest education level: Not on file  Occupational History  . 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 Financial resource strain: Not on file  . Food insecurity:    Worry: Not on file    Inability: Not on file  . Transportation  needs:    Medical: Not on file    Non-medical: Not on file  Tobacco Use  . Smoking status: Never Smoker  . Smokeless tobacco: Never Used  Substance and Sexual Activity  . Alcohol use: No  . Drug use: Not on file  . Sexual activity: Never  Lifestyle  . Physical activity:    Days per week: Not on file    Minutes per session: Not on file  . Stress: Not on file  Relationships  . Social connections:    Talks on phone: Not on file    Gets together: Not on file    Attends religious service: Not on file    Active member of club or organization: Not on file    Attends meetings of clubs or organizations: Not on file    Relationship status: Not on file  . Intimate partner violence:    Fear of current or ex partner: Not on file    Emotionally abused: Not on file    Physically abused: Not on file    Forced sexual activity: Not on file  Other Topics Concern  . Not on file  Social History Narrative   Widowed 2000   Has living will   Sons/DILs are health care POAs (mostly Mike/Jan)   Has DNR   No tube feeds if cognitively unaware     Constitutional: Denies fever, malaise, fatigue, headache or abrupt weight changes.  HEENT: Denies eye pain, eye redness, ear pain, ringing in the ears, wax buildup, runny nose, nasal congestion, bloody nose, or sore throat. Respiratory: Denies difficulty breathing, shortness of breath, cough or sputum production.   Cardiovascular: Denies chest pain, chest tightness, palpitations or swelling in the hands or feet.  Gastrointestinal: Denies abdominal pain, bloating, constipation, diarrhea or blood in the stool.  GU: Denies urgency, frequency, pain with urination, burning sensation, blood in urine, odor or discharge. Musculoskeletal: Pt reports bilateral hip pain. Denies decrease in range of motion, difficulty with gait, muscle pain or joint swelling.  Skin: Denies redness, rashes, lesions or ulcercations.  Neurological: Pt reports difficulty with balance.  Denies dizziness, difficulty with memory, difficulty with speech or problems with coordination.  Psych: Denies anxiety, depression, SI/HI.  No other specific complaints in a complete review of systems (except as listed in HPI above).  Objective:   Physical Exam    BP (!) 126/59   Pulse 72   Temp 98.4 F (36.9 C)   Resp 18   Wt 119 lb 6.4 oz (54.2 kg)   BMI 20.49 kg/m  Wt Readings from Last 3 Encounters:  08/03/18 119 lb 6.4 oz (54.2 kg)  06/27/18 123  lb 4.8 oz (55.9 kg)  06/24/18 124 lb 3.2 oz (56.3 kg)    General: Appears her stated age, in NAD. Skin: Warm, dry and intact. Vesicular rash noted of right buttocks. Cardiovascular: Normal rate and rhythm. Murmur noted. No edema. Pulmonary/Chest: Normal effort and positive vesicular breath sounds. No respiratory distress. No wheezes, rales or ronchi noted.  Abdomen: Soft and nontender. Normal bowel sounds.  Musculoskeletal: Gait slow and steady with use of rolling walker. Neurological: Alert and oriented.  Psychiatric: Mood and affect normal.   BMET    Component Value Date/Time   NA 136 12/03/2016 0431   NA 140 05/30/2011 1101   K 4.3 12/03/2016 0431   K 4.3 05/30/2011 1101   CL 102 12/03/2016 0431   CL 99 05/30/2011 1101   CO2 28 12/03/2016 0431   CO2 30 05/30/2011 1101   GLUCOSE 108 (H) 12/03/2016 0431   GLUCOSE 101 (H) 05/30/2011 1101   BUN 18 12/03/2016 0431   BUN 20 (H) 05/30/2011 1101   CREATININE 1.3 (A) 10/09/2017   CREATININE 1.05 (H) 12/03/2016 0431   CREATININE 1.69 (H) 11/18/2011 1535   CALCIUM 10.0 12/03/2016 0431   CALCIUM 11.0 (H) 05/30/2011 1101   GFRNONAA 44 (L) 12/03/2016 0431   GFRNONAA 26 (L) 11/18/2011 1535   GFRAA 51 (L) 12/03/2016 0431   GFRAA 31 (L) 11/18/2011 1535    Lipid Panel  No results found for: CHOL, TRIG, HDL, CHOLHDL, VLDL, LDLCALC  CBC    Component Value Date/Time   WBC 11.3 (H) 12/03/2016 0431   RBC 3.11 (L) 12/03/2016 0431   HGB 10.1 (A) 10/09/2017   HGB 12.7  05/30/2011 1101   HCT 30 (A) 10/09/2017   HCT 37.8 05/30/2011 1101   PLT 266 12/03/2016 0431   PLT 237 05/30/2011 1101   MCV 97.9 12/03/2016 0431   MCV 101 (H) 05/30/2011 1101   MCH 32.9 12/03/2016 0431   MCHC 33.7 12/03/2016 0431   RDW 13.9 12/03/2016 0431   RDW 13.5 05/30/2011 1101   LYMPHSABS 0.7 (L) 09/06/2016 2338   MONOABS 1.0 (H) 09/06/2016 2338   EOSABS 0.0 09/06/2016 2338   BASOSABS 0.0 09/06/2016 2338    Hgb A1C No results found for: HGBA1C        Assessment & Plan:   Shingles:  RX for Acyclovir Ointment 4 x day for 7 days

## 2018-08-03 NOTE — Patient Instructions (Signed)

## 2018-08-03 NOTE — Assessment & Plan Note (Signed)
No issues off meds °Will monitor °

## 2018-08-07 ENCOUNTER — Ambulatory Visit
Admission: RE | Admit: 2018-08-07 | Discharge: 2018-08-07 | Disposition: A | Discharge status: Home / Self Care | Payer: Medicare PPO | Source: Ambulatory Visit | Attending: Orthopedic Surgery | Admitting: Orthopedic Surgery

## 2018-08-07 ENCOUNTER — Other Ambulatory Visit: Payer: Self-pay

## 2018-08-07 DIAGNOSIS — M1611 Unilateral primary osteoarthritis, right hip: Secondary | ICD-10-CM | POA: Diagnosis not present

## 2018-08-07 DIAGNOSIS — S32591A Other specified fracture of right pubis, initial encounter for closed fracture: Secondary | ICD-10-CM | POA: Diagnosis present

## 2018-10-04 DIAGNOSIS — I10 Essential (primary) hypertension: Secondary | ICD-10-CM | POA: Diagnosis not present

## 2018-10-19 IMAGING — CT CT HEAD W/O CM
3 series · 15 of 46 positions shown, 18 images · non-contrast
Comparison: None.

CLINICAL DATA: Forgetting words, hard time speaking for while,
noticed by a friend yesterday, none non last time was normal,
history hypertension, asthma, hyperlipidemia

EXAM:
CT HEAD WITHOUT CONTRAST
TECHNIQUE: Contiguous axial images were obtained from the base of the skull
through the vertex without intravenous contrast.

[Series 2: head wo · axial · 0.41mm/px · z∈[-163,-43]mm · 9 of 29 slices shown, 12 images]
[im 3/29  brain]
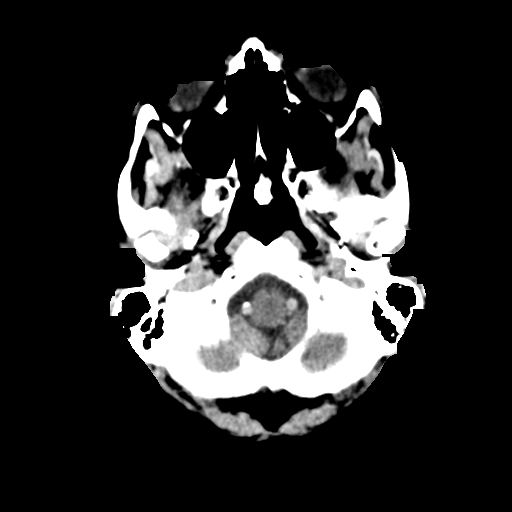
[im 3/29  bone]
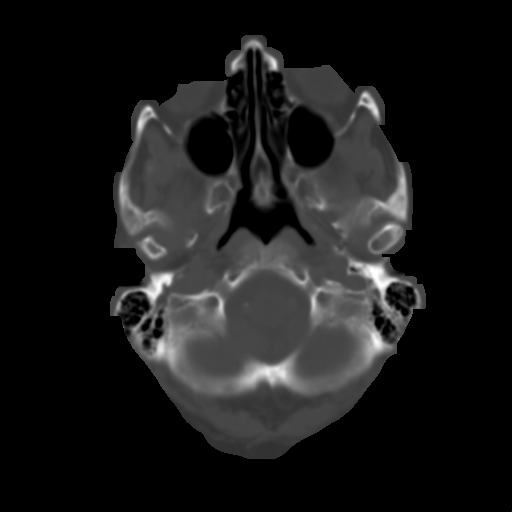
[im 6/29  brain]
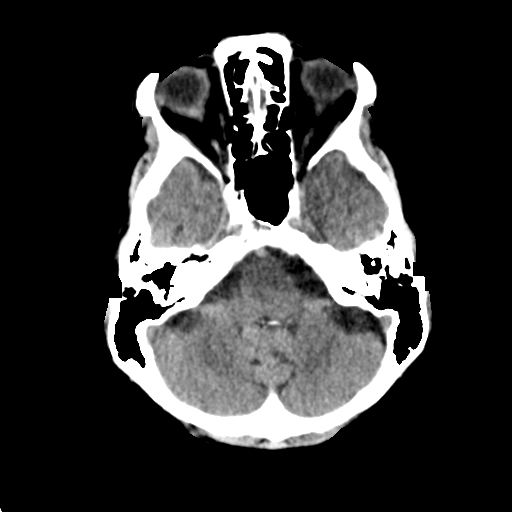
[im 9/29  brain]
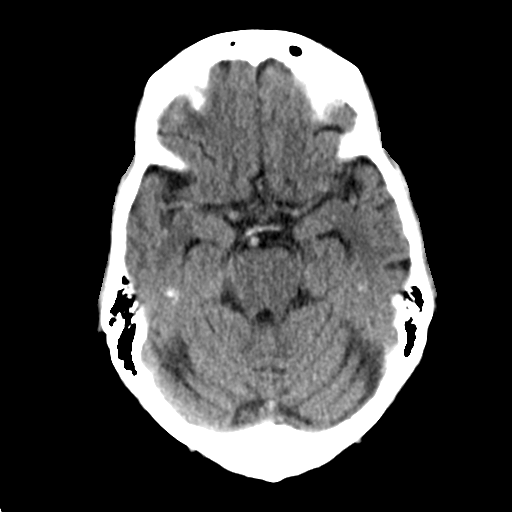
[im 12/29  brain]
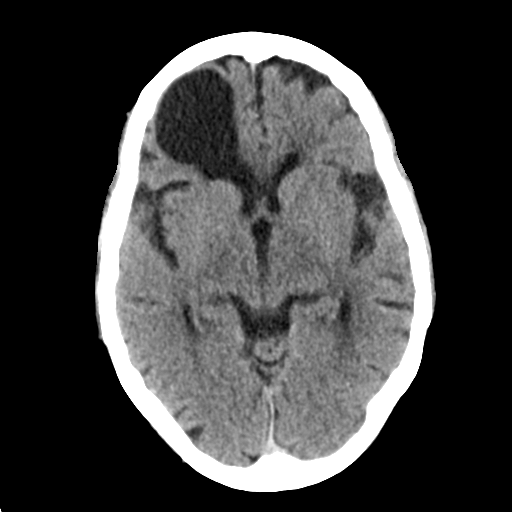
[im 15/29  brain]
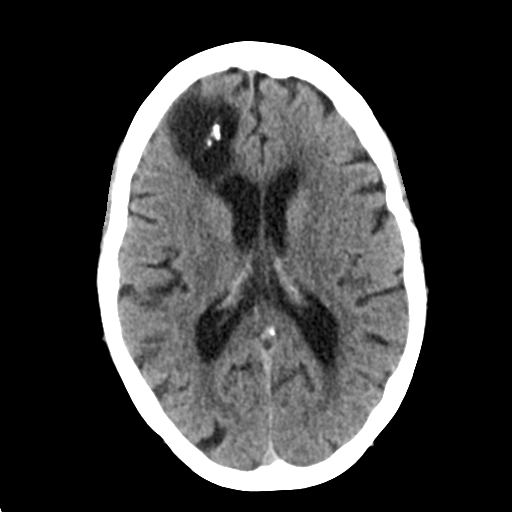
[im 15/29  bone]
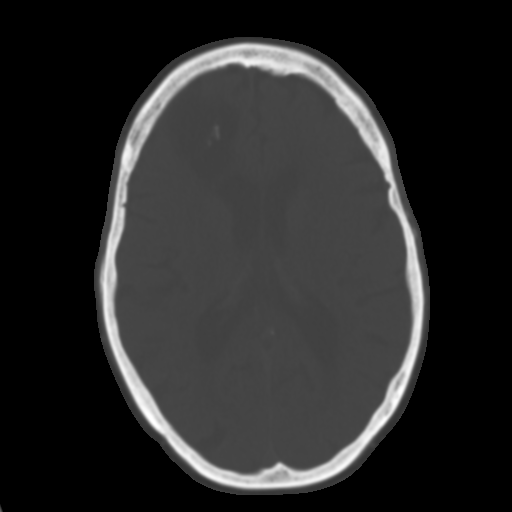
[im 18/29  brain]
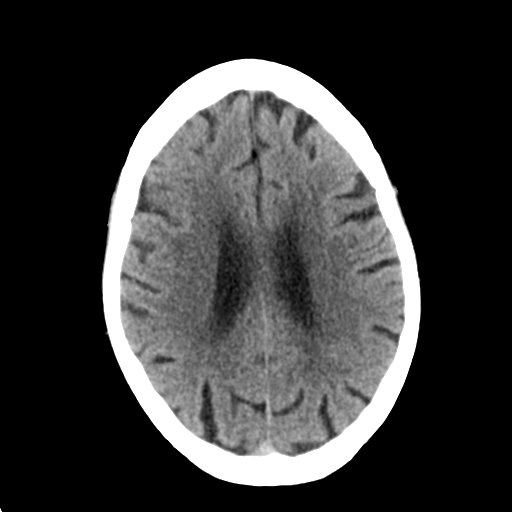
[im 21/29  brain]
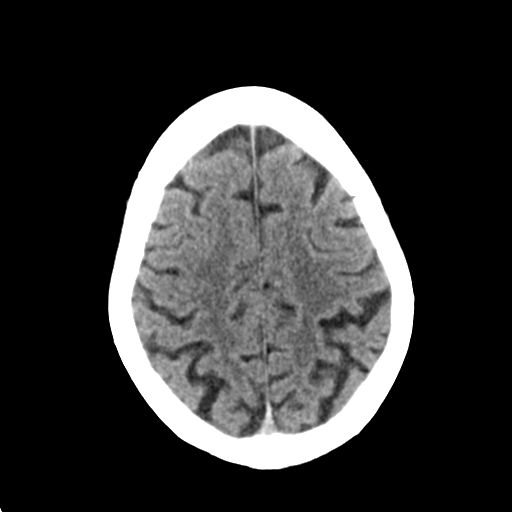
[im 24/29  brain]
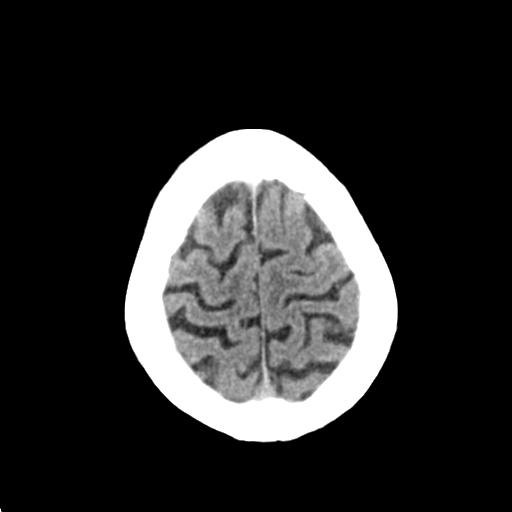
[im 27/29  brain]
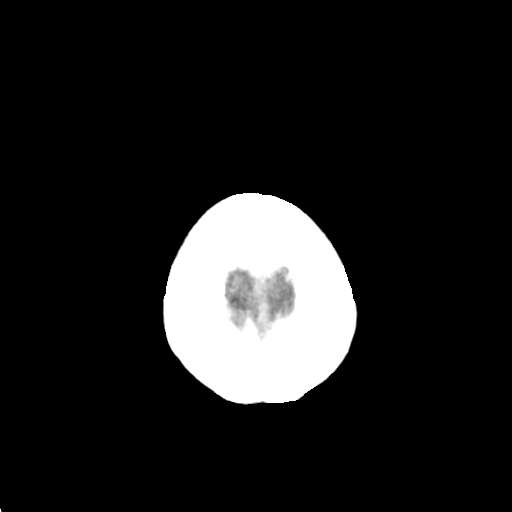
[im 27/29  bone]
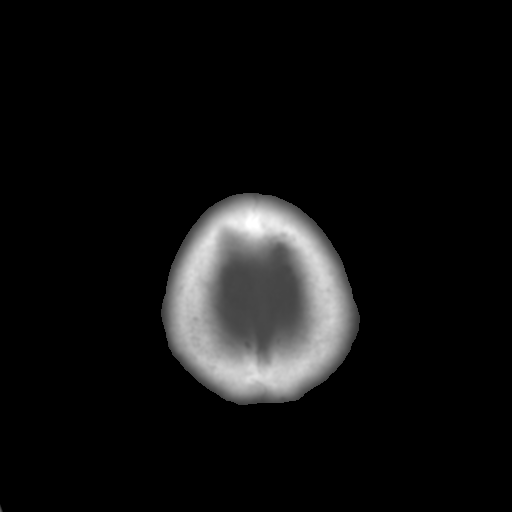

[Series 4: coronal soft tissue · coronal · 0.30mm/px · 3 of 63 slices shown]
[im 21/63  brain]
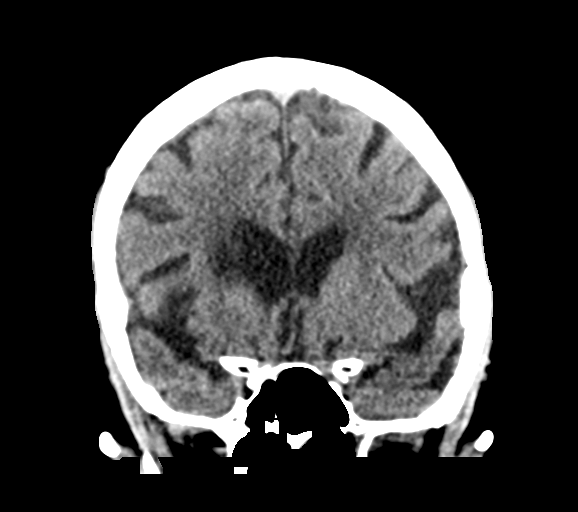
[im 28/63  brain]
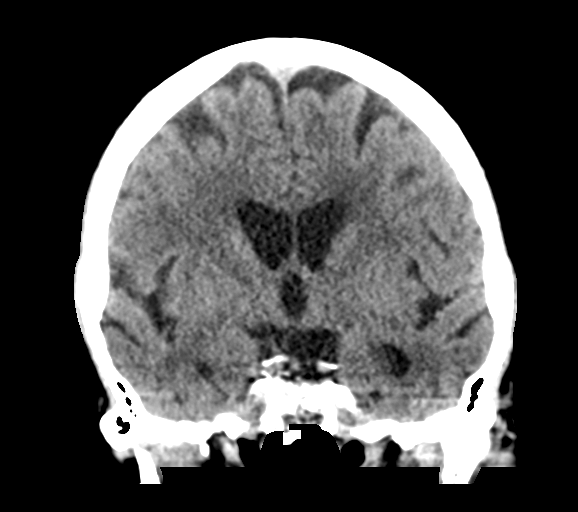
[im 35/63  brain]
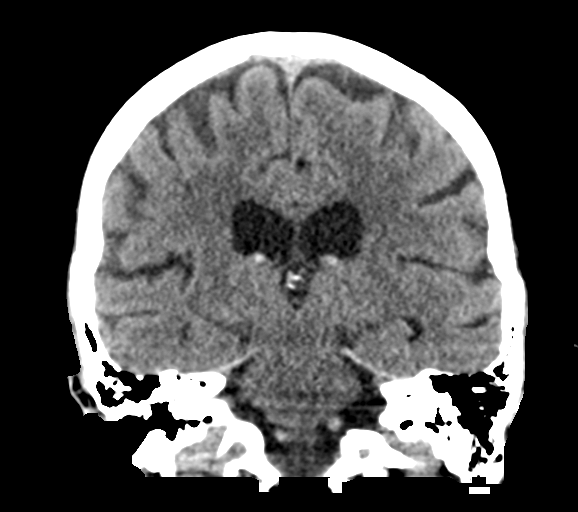

[Series 5: sagittal soft tissue · sagittal · 0.30mm/px · 3 of 47 slices shown]
[im 16/47  brain]
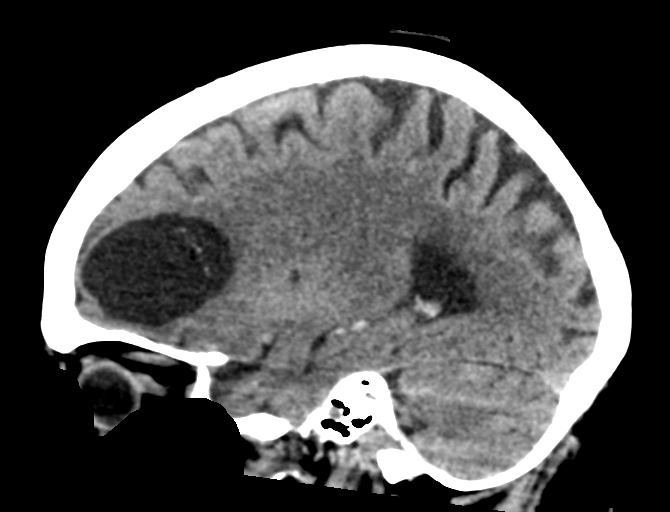
[im 24/47  brain]
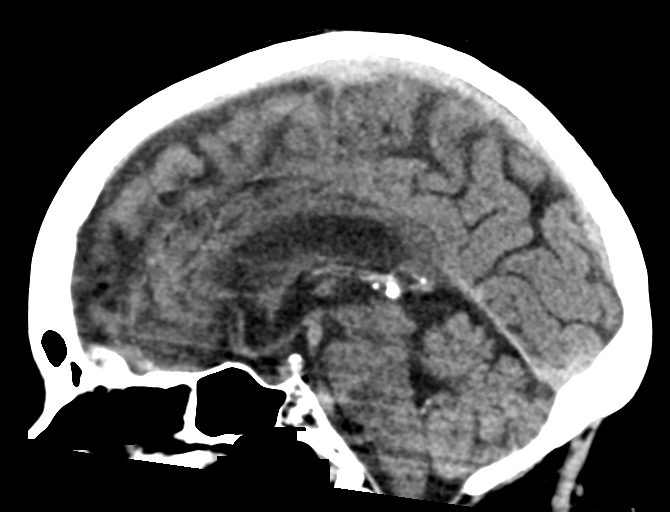
[im 31/47  brain]
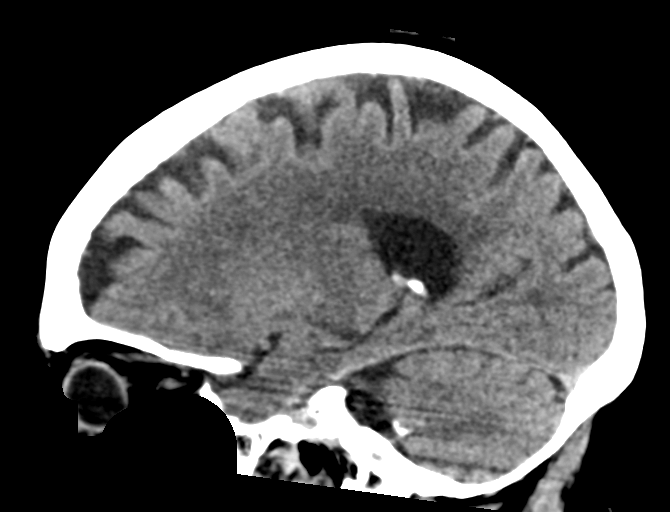

[15 of 46 positions shown; findings below may reference images not displayed]

FINDINGS: Brain: Generalized atrophy. CSF attenuation structure in the RIGHT
frontal lobe 3.4 x 4.1 x 3.4 cm with a few marginal calcifications,
with a thin membrane separating it from the adjacent frontal horn of
the RIGHT lateral ventricle. Tiny low-attenuation focus in the RIGHT
basal ganglia could represent a tiny remote lacunar infarct or a
prominent perivascular space. No definite intracranial hemorrhage,
additional mass lesion, evidence acute infarction, or extra-axial
fluid collection.

Vascular: Atherosclerotic calcifications at the internal carotid and
vertebral arteries at skullbase.

Skull: Intact

Sinuses/Orbits: Clear

Other: N/A
IMPRESSION: No definite evidence of acute hemorrhage or infarct.

CSF attenuation cystic appearing mass in the RIGHT frontal lobe,
x 4.1 x 3.4 cm in size, containing a thin wall with a few
calcifications. This has a generally benign appearance and does not
appear to communicate with the ventricular system. Differential
diagnosis could include neuroglial cyst, sequela of remote infection
or hemorrhage, less likely epidermoid tumor, unlikely arachnoid cyst
or porencephalic cyst.

If patient's clinical condition warrants further assessment, this
could be evaluated by MR.

## 2018-11-08 ENCOUNTER — Other Ambulatory Visit: Payer: Self-pay

## 2018-11-08 ENCOUNTER — Encounter: Payer: Self-pay | Admitting: Internal Medicine

## 2018-11-08 ENCOUNTER — Ambulatory Visit: Payer: Medicare PPO | Admitting: Internal Medicine

## 2018-11-08 DIAGNOSIS — F1120 Opioid dependence, uncomplicated: Secondary | ICD-10-CM

## 2018-11-08 DIAGNOSIS — M159 Polyosteoarthritis, unspecified: Secondary | ICD-10-CM | POA: Diagnosis not present

## 2018-11-08 DIAGNOSIS — S32599D Other specified fracture of unspecified pubis, subsequent encounter for fracture with routine healing: Secondary | ICD-10-CM

## 2018-11-08 DIAGNOSIS — N183 Chronic kidney disease, stage 3 unspecified: Secondary | ICD-10-CM

## 2018-11-08 DIAGNOSIS — I10 Essential (primary) hypertension: Secondary | ICD-10-CM

## 2018-11-08 DIAGNOSIS — S32599A Other specified fracture of unspecified pubis, initial encounter for closed fracture: Secondary | ICD-10-CM | POA: Insufficient documentation

## 2018-11-08 NOTE — Assessment & Plan Note (Signed)
GFR stable BP is good so no new medications

## 2018-11-08 NOTE — Assessment & Plan Note (Signed)
Ongoing pain Doesn't think tylenol is enough Will adjust pain regimen

## 2018-11-08 NOTE — Assessment & Plan Note (Signed)
Has recovered from this fairly well Continues on analgesics

## 2018-11-08 NOTE — Progress Notes (Signed)
Subjective:    Patient ID: Jacqueline Sanchez, female    DOB: [birth year], 85 y.o.   MRN: [id number]  HPI Visit in assisted living apartment for follow up of chronic health conditions Reviewed status with [name] RN  Had fractured pelvis (pubic ramus) in March Was able to stay in AL Pain is better Takes tylenol and oxycodone regularly--but not sure the tylenol alone does any good Walks with walker Has aide twice a day to help with personal care  Still has some pain in lower ribs at night No other chest pain No SOB  No heartburn  No dysphagia  Reviewed labs GFR 46 Hemoglobin fairly stable at 9.6  Current Outpatient Medications on File Prior to Visit  Medication Sig Dispense Refill  . acetaminophen (TYLENOL) 325 MG tablet Take 2 tablets (650 mg total) by mouth every 6 (six) hours as needed for mild pain. (Patient not taking: Reported on 08/03/2018)    . amLODipine (NORVASC) 10 MG tablet Take 1 tablet (10 mg total) by mouth daily. (Patient taking differently: Take 10 mg by mouth every morning. ) 30 tablet 0  . cetirizine (ZYRTEC) 10 MG tablet Take 10 mg by mouth at bedtime.    . ferrous sulfate 325 (65 FE) MG tablet Take 325 mg by mouth daily with breakfast.    . gabapentin (NEURONTIN) 100 MG capsule Take 1 capsule (100 mg total) by mouth 3 (three) times daily. 30 capsule 0  . hydrALAZINE (APRESOLINE) 25 MG tablet Take 1 tablet (25 mg total) by mouth 4 (four) times daily. (Patient taking differently: Take 25 mg by mouth 3 (three) times daily. ) 120 tablet 1  . loratadine (CLARITIN) 10 MG tablet Take 10 mg by mouth daily.    . Melatonin 3 MG TABS Take 1 tablet by mouth at bedtime.    . metoprolol succinate (TOPROL-XL) 50 MG 24 hr tablet Take 1 tablet (50 mg total) by mouth daily. Take with or immediately following a meal. (Patient taking differently: Take 50 mg by mouth every morning. Take with or immediately following a meal.) 30 tablet 1  . Multiple Vitamin (MULTI-VITAMINS) TABS Take  1 tablet by mouth daily.    Marland Kitchen oxyCODONE-acetaminophen (PERCOCET/ROXICET) 5-325 MG tablet Take 1 tablet by mouth at bedtime. And 1 tid prn    . polyethylene glycol (MIRALAX / GLYCOLAX) packet Take 17 g by mouth daily.    Marland Kitchen senna (SENOKOT) 8.6 MG tablet Take 2 tablets by mouth 2 (two) times daily.     . vitamin B-12 (CYANOCOBALAMIN) 1000 MCG tablet Take 1,000 mcg by mouth daily.    . Vitamin D, Cholecalciferol, 1000 units CAPS Take 1 capsule by mouth daily.     No current facility-administered medications on file prior to visit.     Allergies  Allergen Reactions  . Penicillins     Many many many years ago   . Morphine And Related Nausea Only    Past Medical History:  Diagnosis Date  . Aphasia 04/2016   Transient---??TIA  . Asthma   . CKD (chronic kidney disease)   . Edema   . Femur fracture, left ([place]) 09/06/2016  . GERD (gastroesophageal reflux disease)   . Hyperlipidemia   . Hypertension   . Osteoporosis     Past Surgical History:  Procedure Laterality Date  . ANTERIOR HIP REVISION Left 12/01/2016   Procedure: ANTERIOR HIP REVISION, REVISION TO TOTAL;  Surgeon: Hessie Knows, MD;  Location: [facility];  Service: Orthopedics;  Laterality: Left;  .  HIP ARTHROPLASTY Left 09/07/2016   Procedure: ARTHROPLASTY BIPOLAR HIP (HEMIARTHROPLASTY);  Surgeon: Thornton Park, MD;  Location: [facility];  Service: Orthopedics;  Laterality: Left;  . HIP CLOSED REDUCTION Left 11/03/2016   Procedure: CLOSED REDUCTION HIP;  Surgeon: Thornton Park, MD;  Location: [facility];  Service: Orthopedics;  Laterality: Left;  . HIP CLOSED REDUCTION Left 11/24/2016   Procedure: CLOSED REDUCTION HIP;  Surgeon: Thornton Park, MD;  Location: [facility];  Service: Orthopedics;  Laterality: Left;  . KYPHOPLASTY      Family History  Problem Relation Age of Onset  . Hypertension Other     Social History   Socioeconomic History  . Marital status: Widowed    Spouse name: Not on file  . Number of children: 2   . Years of education: Not on file  . Highest education level: Not on file  Occupational History  . 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 Financial resource strain: Not on file  . Food insecurity    Worry: Not on file    Inability: Not on file  . Transportation needs    Medical: Not on file    Non-medical: Not on file  Tobacco Use  . Smoking status: Never Smoker  . Smokeless tobacco: Never Used  Substance and Sexual Activity  . Alcohol use: No  . Drug use: Not on file  . Sexual activity: Never  Lifestyle  . Physical activity    Days per week: Not on file    Minutes per session: Not on file  . Stress: Not on file  Relationships  . Social Herbalist on phone: Not on file    Gets together: Not on file    Attends religious service: Not on file    Active member of club or organization: Not on file    Attends meetings of clubs or organizations: Not on file    Relationship status: Not on file  . Intimate partner violence    Fear of current or ex partner: Not on file    Emotionally abused: Not on file    Physically abused: Not on file    Forced sexual activity: Not on file  Other Topics Concern  . Not on file  Social History Narrative   Widowed 2000   Has living will   Sons/DILs are health care POAs (mostly Mike/Jan)   Has DNR   No tube feeds if cognitively unaware   Review of Systems  Appetite is fair Weight fairly stable Sleeps well Some urinary incontinence ---wears protection at night. Notes some urinary frequency as well Bowels are fine     Objective:   Physical Exam  Constitutional: She appears well-developed. No distress.  Neck: No thyromegaly present.  Cardiovascular: Normal rate and regular rhythm. Exam reveals no gallop.  Soft systolic murmur  Respiratory: Effort normal and breath sounds normal. No respiratory distress. She has no wheezes. She has no rales.  GI: Soft. There is no abdominal tenderness.   Musculoskeletal:     Comments: Trace edema Slight redness along lateral left foot--not inflamed or tender  Lymphadenopathy:    She has no cervical adenopathy.  Psychiatric: She has a normal mood and affect. Her behavior is normal.           Assessment & Plan:

## 2018-11-08 NOTE — Assessment & Plan Note (Signed)
Monitored by the staff here

## 2018-11-08 NOTE — Assessment & Plan Note (Signed)
BP Readings from Last 3 Encounters:  08/03/18 (!) 126/59  07/05/18 (!) 158/60  06/27/18 (!) 173/75   Reasonable control No changes needed

## 2018-11-15 ENCOUNTER — Encounter: Payer: Self-pay | Admitting: Internal Medicine

## 2018-11-15 ENCOUNTER — Ambulatory Visit: Payer: Medicare PPO | Admitting: Internal Medicine

## 2018-11-15 ENCOUNTER — Other Ambulatory Visit: Payer: Self-pay

## 2018-11-15 DIAGNOSIS — I639 Cerebral infarction, unspecified: Secondary | ICD-10-CM | POA: Diagnosis not present

## 2018-11-15 NOTE — Assessment & Plan Note (Addendum)
24-48 hours of aphasia No motor symptoms No clear cognitive issues--but some trouble understanding as well No change in function---continues walking with walker, doing ADLs, etc ASA 325 mg given about an hour ago by RN when I was contacted BP was up 479 systolic at first---now down 160 again (and diastolic 98)---XA will not make any changes as yet PC with DIL Janet---discussed options but I don't recommend ER evaluation since I am not excited about considering carotid artery surgery (probably only actionable problem now). Heart is regular so I doubt atrial fib. Since functioning okay--can leave her in her AL apartment for now. Will increase BP and neuro monitoring  ASA 81mg  daily PT and OT evaluations

## 2018-11-15 NOTE — Progress Notes (Signed)
Subjective:    Patient ID: Jacqueline Sanchez, female    DOB: 24-May-1919, 85 y.o.   MRN: [id number]  HPI Asked to make emergency visit to her assisted living apartment by [name] RN due to garbled speech for 2 days  She was aware of the change as well--"a little while" Seems to have improved some No apparent leg or arm weakness No facial droop  No new chest pain----some discomfort at night that hasn't changed No headache No SOB  Current Outpatient Medications on File Prior to Visit  Medication Sig Dispense Refill  . acetaminophen (TYLENOL) 650 MG CR tablet Take 650 mg by mouth 3 (three) times daily. With percocet    . amLODipine (NORVASC) 10 MG tablet Take 1 tablet (10 mg total) by mouth daily. (Patient taking differently: Take 10 mg by mouth every morning. ) 30 tablet 0  . cetirizine (ZYRTEC) 10 MG tablet Take 10 mg by mouth at bedtime.    . ferrous sulfate 325 (65 FE) MG tablet Take 325 mg by mouth daily with breakfast.    . gabapentin (NEURONTIN) 100 MG capsule Take 1 capsule (100 mg total) by mouth 3 (three) times daily. 30 capsule 0  . hydrALAZINE (APRESOLINE) 25 MG tablet Take 1 tablet (25 mg total) by mouth 4 (four) times daily. (Patient taking differently: Take 25 mg by mouth 3 (three) times daily. ) 120 tablet 1  . loratadine (CLARITIN) 10 MG tablet Take 10 mg by mouth daily.    . Melatonin 3 MG TABS Take 1 tablet by mouth at bedtime.    . metoprolol succinate (TOPROL-XL) 50 MG 24 hr tablet Take 1 tablet (50 mg total) by mouth daily. Take with or immediately following a meal. (Patient taking differently: Take 50 mg by mouth every morning. Take with or immediately following a meal.) 30 tablet 1  . Multiple Vitamin (MULTI-VITAMINS) TABS Take 1 tablet by mouth daily.    Marland Kitchen oxyCODONE (OXY IR/ROXICODONE) 5 MG immediate release tablet Take 5 mg by mouth 3 (three) times daily as needed for severe pain.    Marland Kitchen oxyCODONE-acetaminophen (PERCOCET/ROXICET) 5-325 MG tablet Take 1 tablet by mouth  3 (three) times daily. And 1 tid prn     . polyethylene glycol (MIRALAX / GLYCOLAX) packet Take 17 g by mouth daily.    Marland Kitchen senna (SENOKOT) 8.6 MG tablet Take 2 tablets by mouth 2 (two) times daily.     . vitamin B-12 (CYANOCOBALAMIN) 1000 MCG tablet Take 1,000 mcg by mouth daily.    . Vitamin D, Cholecalciferol, 1000 units CAPS Take 1 capsule by mouth daily.     No current facility-administered medications on file prior to visit.     Allergies  Allergen Reactions  . Penicillins     Many many many years ago   . Morphine And Related Nausea Only    Past Medical History:  Diagnosis Date  . Aphasia 04/2016   Transient---??TIA  . Asthma   . CKD (chronic kidney disease)   . Edema   . Femur fracture, left ([place]) 09/06/2016  . GERD (gastroesophageal reflux disease)   . Hyperlipidemia   . Hypertension   . Osteoporosis     Past Surgical History:  Procedure Laterality Date  . ANTERIOR HIP REVISION Left 12/01/2016   Procedure: ANTERIOR HIP REVISION, REVISION TO TOTAL;  Surgeon: Hessie Knows, MD;  Location: [facility];  Service: Orthopedics;  Laterality: Left;  . HIP ARTHROPLASTY Left 09/07/2016   Procedure: ARTHROPLASTY BIPOLAR HIP (HEMIARTHROPLASTY);  Surgeon: Lennette Bihari  Mack Guise, MD;  Location: [facility];  Service: Orthopedics;  Laterality: Left;  . HIP CLOSED REDUCTION Left 11/03/2016   Procedure: CLOSED REDUCTION HIP;  Surgeon: Thornton Park, MD;  Location: [facility];  Service: Orthopedics;  Laterality: Left;  . HIP CLOSED REDUCTION Left 11/24/2016   Procedure: CLOSED REDUCTION HIP;  Surgeon: Thornton Park, MD;  Location: [facility];  Service: Orthopedics;  Laterality: Left;  . KYPHOPLASTY      Family History  Problem Relation Age of Onset  . Hypertension Other     Social History   Socioeconomic History  . Marital status: Widowed    Spouse name: Not on file  . Number of children: 2  . Years of education: Not on file  . Highest education level: Not on file  Occupational History   . 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 Financial resource strain: Not on file  . Food insecurity    Worry: Not on file    Inability: Not on file  . Transportation needs    Medical: Not on file    Non-medical: Not on file  Tobacco Use  . Smoking status: Never Smoker  . Smokeless tobacco: Never Used  Substance and Sexual Activity  . Alcohol use: No  . Drug use: Not on file  . Sexual activity: Never  Lifestyle  . Physical activity    Days per week: Not on file    Minutes per session: Not on file  . Stress: Not on file  Relationships  . Social Herbalist on phone: Not on file    Gets together: Not on file    Attends religious service: Not on file    Active member of club or organization: Not on file    Attends meetings of clubs or organizations: Not on file    Relationship status: Not on file  . Intimate partner violence    Fear of current or ex partner: Not on file    Emotionally abused: Not on file    Physically abused: Not on file    Forced sexual activity: Not on file  Other Topics Concern  . Not on file  Social History Narrative   Widowed 2000   Has living will   Sons/DILs are health care POAs (mostly Mike/Jan)   Has DNR   No tube feeds if cognitively unaware   Review of Systems  Eating okay Sleeping fine No change in pain---does fluctuate  Some constipation     Objective:   Physical Exam  Constitutional: She is oriented to person, place, and time. She appears well-developed.  Cardiovascular: Normal rate and regular rhythm. Exam reveals no gallop.  Stable systolic murmur  Respiratory: Effort normal and breath sounds normal. No respiratory distress.  GI: Soft.  Neurological: She is oriented to person, place, and time. She has normal strength. She displays no tremor. No cranial nerve deficit. She exhibits normal muscle tone. She displays a negative Romberg sign. Coordination normal.  Mild expressive aphasia---speech  now fairly fluent but word finding problems (like wondering what the opposite of diarrhea was) Trouble getting some questions out. Didn't know how to call sons Transfers in and out of chair well and walks ably with walker           Assessment & Plan:

## 2018-12-14 DIAGNOSIS — R358 Other polyuria: Secondary | ICD-10-CM | POA: Diagnosis not present

## 2018-12-14 DIAGNOSIS — R3981 Functional urinary incontinence: Secondary | ICD-10-CM | POA: Diagnosis not present

## 2018-12-14 DIAGNOSIS — R419 Unspecified symptoms and signs involving cognitive functions and awareness: Secondary | ICD-10-CM | POA: Diagnosis not present

## 2019-03-07 DIAGNOSIS — Z03818 Encounter for observation for suspected exposure to other biological agents ruled out: Secondary | ICD-10-CM | POA: Diagnosis not present

## 2019-03-21 DIAGNOSIS — Z03818 Encounter for observation for suspected exposure to other biological agents ruled out: Secondary | ICD-10-CM | POA: Diagnosis not present

## 2019-03-21 DIAGNOSIS — H35373 Puckering of macula, bilateral: Secondary | ICD-10-CM | POA: Diagnosis not present

## 2019-04-05 ENCOUNTER — Other Ambulatory Visit: Payer: Self-pay

## 2019-04-05 ENCOUNTER — Ambulatory Visit: Payer: Medicare PPO | Admitting: Internal Medicine

## 2019-04-05 VITALS — BP 140/69 | HR 72 | Resp 18 | Wt 125.4 lb

## 2019-04-05 DIAGNOSIS — I10 Essential (primary) hypertension: Secondary | ICD-10-CM

## 2019-04-05 DIAGNOSIS — D508 Other iron deficiency anemias: Secondary | ICD-10-CM

## 2019-04-05 DIAGNOSIS — M79675 Pain in left toe(s): Secondary | ICD-10-CM

## 2019-04-05 DIAGNOSIS — K219 Gastro-esophageal reflux disease without esophagitis: Secondary | ICD-10-CM

## 2019-04-05 DIAGNOSIS — B351 Tinea unguium: Secondary | ICD-10-CM

## 2019-04-05 DIAGNOSIS — Z8673 Personal history of transient ischemic attack (TIA), and cerebral infarction without residual deficits: Secondary | ICD-10-CM

## 2019-04-05 DIAGNOSIS — M79674 Pain in right toe(s): Secondary | ICD-10-CM

## 2019-04-05 DIAGNOSIS — M159 Polyosteoarthritis, unspecified: Secondary | ICD-10-CM

## 2019-04-07 ENCOUNTER — Encounter: Payer: Self-pay | Admitting: Internal Medicine

## 2019-04-07 DIAGNOSIS — Z8673 Personal history of transient ischemic attack (TIA), and cerebral infarction without residual deficits: Secondary | ICD-10-CM | POA: Insufficient documentation

## 2019-04-07 DIAGNOSIS — D509 Iron deficiency anemia, unspecified: Secondary | ICD-10-CM | POA: Insufficient documentation

## 2019-04-07 DIAGNOSIS — I69320 Aphasia following cerebral infarction: Secondary | ICD-10-CM | POA: Insufficient documentation

## 2019-04-07 NOTE — Progress Notes (Signed)
Subjective:    Patient ID: Jacqueline Sanchez, female    DOB: 1920/03/02, 85 y.o.   MRN: [id number]  HPI  Resident seen in appt 216 for routine follow up.  RN reports speech is not as clear after having multiple TIA Resident requesting nail trim. Nails thick and long, getting caught on socks causing pain.  Otherwise doing well. Sleeps well. Independent with ADL's. Walks with walker. Appetite good, weight steady. No issues with bowel or bladder. Denies chest pain, shortness of breath or reflux.  HTN: Managed on Amlodipine, Hydralazine, Metoprolol.  Iron Deficiency Anemia: Her last H/H was 9.7/29.3. She is taking an iron supplement. She denies constipation.   OA: Mainly in her back. She also has neuropathy in her legs. She is taking Gabapentin, Tylenol and Percocet with good relief.   TIA's: Mainly cognitive with some expressive aphasia. She is taking ASA, Amlodipine, Hydralazine and Metoprolol as prescribed.   Review of Systems  Past Medical History:  Diagnosis Date  . Aphasia 04/2016   Transient---??TIA  . Asthma   . CKD (chronic kidney disease)   . Edema   . Femur fracture, left ([place]) 09/06/2016  . GERD (gastroesophageal reflux disease)   . Hyperlipidemia   . Hypertension   . Osteoporosis     Current Outpatient Medications  Medication Sig Dispense Refill  . acetaminophen (TYLENOL) 650 MG CR tablet Take 650 mg by mouth 3 (three) times daily. With percocet    . amLODipine (NORVASC) 10 MG tablet Take 1 tablet (10 mg total) by mouth daily. (Patient taking differently: Take 10 mg by mouth every morning. ) 30 tablet 0  . cetirizine (ZYRTEC) 10 MG tablet Take 10 mg by mouth at bedtime.    . ferrous sulfate 325 (65 FE) MG tablet Take 325 mg by mouth daily with breakfast.    . gabapentin (NEURONTIN) 100 MG capsule Take 1 capsule (100 mg total) by mouth 3 (three) times daily. 30 capsule 0  . hydrALAZINE (APRESOLINE) 25 MG tablet Take 1 tablet (25 mg total) by mouth 4 (four) times  daily. (Patient taking differently: Take 25 mg by mouth 3 (three) times daily. ) 120 tablet 1  . loratadine (CLARITIN) 10 MG tablet Take 10 mg by mouth daily.    . Melatonin 3 MG TABS Take 1 tablet by mouth at bedtime.    . metoprolol succinate (TOPROL-XL) 50 MG 24 hr tablet Take 1 tablet (50 mg total) by mouth daily. Take with or immediately following a meal. (Patient taking differently: Take 50 mg by mouth every morning. Take with or immediately following a meal.) 30 tablet 1  . Multiple Vitamin (MULTI-VITAMINS) TABS Take 1 tablet by mouth daily.    Marland Kitchen oxyCODONE (OXY IR/ROXICODONE) 5 MG immediate release tablet Take 5 mg by mouth 3 (three) times daily as needed for severe pain.    Marland Kitchen oxyCODONE-acetaminophen (PERCOCET/ROXICET) 5-325 MG tablet Take 1 tablet by mouth 3 (three) times daily. And 1 tid prn     . polyethylene glycol (MIRALAX / GLYCOLAX) packet Take 17 g by mouth daily.    Marland Kitchen senna (SENOKOT) 8.6 MG tablet Take 2 tablets by mouth 2 (two) times daily.     . vitamin B-12 (CYANOCOBALAMIN) 1000 MCG tablet Take 1,000 mcg by mouth daily.    . Vitamin D, Cholecalciferol, 1000 units CAPS Take 1 capsule by mouth daily.     No current facility-administered medications for this visit.     Allergies  Allergen Reactions  . Penicillins  Many many many years ago   . Morphine And Related Nausea Only    Family History  Problem Relation Age of Onset  . Hypertension Other     Social History   Socioeconomic History  . Marital status: Widowed    Spouse name: Not on file  . Number of children: 2  . Years of education: Not on file  . Highest education level: Not on file  Occupational History  . 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 Financial resource strain: Not on file  . Food insecurity    Worry: Not on file    Inability: Not on file  . Transportation needs    Medical: Not on file    Non-medical: Not on file  Tobacco Use  . Smoking status: Never  Smoker  . Smokeless tobacco: Never Used  Substance and Sexual Activity  . Alcohol use: No  . Drug use: Not on file  . Sexual activity: Never  Lifestyle  . Physical activity    Days per week: Not on file    Minutes per session: Not on file  . Stress: Not on file  Relationships  . Social Herbalist on phone: Not on file    Gets together: Not on file    Attends religious service: Not on file    Active member of club or organization: Not on file    Attends meetings of clubs or organizations: Not on file    Relationship status: Not on file  . Intimate partner violence    Fear of current or ex partner: Not on file    Emotionally abused: Not on file    Physically abused: Not on file    Forced sexual activity: Not on file  Other Topics Concern  . Not on file  Social History Narrative   Widowed 2000   Has living will   Sons/DILs are health care POAs (mostly Mike/Jan)   Has DNR   No tube feeds if cognitively unaware     Constitutional: Denies fever, malaise, fatigue, headache or abrupt weight changes.  HEENT: Denies eye pain, eye redness, ear pain, ringing in the ears, wax buildup, runny nose, nasal congestion, bloody nose, or sore throat. Respiratory: Denies difficulty breathing, shortness of breath, cough or sputum production.   Cardiovascular: Denies chest pain, chest tightness, palpitations or swelling in the hands or feet.  Gastrointestinal: Denies abdominal pain, bloating, constipation, diarrhea or blood in the stool.  GU: Denies urgency, frequency, pain with urination, burning sensation, blood in urine, odor or discharge. Musculoskeletal: Pt reports chronic back pain. Denies decrease in range of motion, difficulty with gait, muscle pain or joint pain and swelling.  Skin: Denies redness, rashes, lesions or ulcercations.  Neurological: Pt reports difficulty with speech. Denies dizziness, difficulty with memory, or problems with balance and coordination.  Psych: Denies  anxiety, depression, SI/HI.  No other specific complaints in a complete review of systems (except as listed in HPI above).     Objective:   Physical Exam   BP 140/69   Pulse 72   Resp 18   Wt 125 lb 6.4 oz (56.9 kg)   BMI 21.52 kg/m  Wt Readings from Last 3 Encounters:  04/07/19 125 lb 6.4 oz (56.9 kg)  08/03/18 119 lb 6.4 oz (54.2 kg)  06/27/18 123 lb 4.8 oz (55.9 kg)    General: Appears her stated age, well developed, well nourished in NAD. Skin: Mycotic toenails bilaterally. Neck:  Neck supple, trachea midline. No masses, lumps or thyromegaly present.  Cardiovascular: Normal rate and rhythm. S1,S2 noted.  No murmur, rubs or gallops noted. No JVD or BLE edema. Pulmonary/Chest: Normal effort and positive vesicular breath sounds. No respiratory distress. No wheezes, rales or ronchi noted.  Abdomen: Soft and nontender. Normal bowel sounds.  Musculoskeletal: Gait not visualized.  Neurological: Alert and oriented. Obvious expressive aphasia.   BMET    Component Value Date/Time   NA 136 12/03/2016 0431   NA 140 05/30/2011 1101   K 4.3 12/03/2016 0431   K 4.3 05/30/2011 1101   CL 102 12/03/2016 0431   CL 99 05/30/2011 1101   CO2 28 12/03/2016 0431   CO2 30 05/30/2011 1101   GLUCOSE 108 (H) 12/03/2016 0431   GLUCOSE 101 (H) 05/30/2011 1101   BUN 18 12/03/2016 0431   BUN 20 (H) 05/30/2011 1101   CREATININE 1.3 (A) 10/09/2017   CREATININE 1.05 (H) 12/03/2016 0431   CREATININE 1.69 (H) 11/18/2011 1535   CALCIUM 10.0 12/03/2016 0431   CALCIUM 11.0 (H) 05/30/2011 1101   GFRNONAA 44 (L) 12/03/2016 0431   GFRNONAA 26 (L) 11/18/2011 1535   GFRAA 51 (L) 12/03/2016 0431   GFRAA 31 (L) 11/18/2011 1535    Lipid Panel  No results found for: CHOL, TRIG, HDL, CHOLHDL, VLDL, LDLCALC  CBC    Component Value Date/Time   WBC 11.3 (H) 12/03/2016 0431   RBC 3.11 (L) 12/03/2016 0431   HGB 10.1 (A) 10/09/2017   HGB 12.7 05/30/2011 1101   HCT 30 (A) 10/09/2017   HCT 37.8  05/30/2011 1101   PLT 266 12/03/2016 0431   PLT 237 05/30/2011 1101   MCV 97.9 12/03/2016 0431   MCV 101 (H) 05/30/2011 1101   MCH 32.9 12/03/2016 0431   MCHC 33.7 12/03/2016 0431   RDW 13.9 12/03/2016 0431   RDW 13.5 05/30/2011 1101   LYMPHSABS 0.7 (L) 09/06/2016 2338   MONOABS 1.0 (H) 09/06/2016 2338   EOSABS 0.0 09/06/2016 2338   BASOSABS 0.0 09/06/2016 2338    Hgb A1C No results found for: HGBA1C         Assessment & Plan:

## 2019-04-07 NOTE — Patient Instructions (Signed)

## 2019-04-07 NOTE — Assessment & Plan Note (Signed)
Controlled on current meds Will monitor 

## 2019-04-07 NOTE — Assessment & Plan Note (Signed)
Continue Tylenol, Percocet and Gabapentin

## 2019-04-07 NOTE — Assessment & Plan Note (Signed)
Currently not an issue Will monitor 

## 2019-04-07 NOTE — Assessment & Plan Note (Signed)
Continue antihypertensive therapy and ASA Appreciate ALF care

## 2019-04-07 NOTE — Assessment & Plan Note (Signed)
Continue iron supplement.

## 2019-06-05 ENCOUNTER — Ambulatory Visit (INDEPENDENT_AMBULATORY_CARE_PROVIDER_SITE_OTHER): Payer: Medicare PPO | Admitting: Internal Medicine

## 2019-06-05 ENCOUNTER — Other Ambulatory Visit: Payer: Self-pay

## 2019-06-05 ENCOUNTER — Encounter: Payer: Self-pay | Admitting: Internal Medicine

## 2019-06-05 VITALS — BP 115/69 | HR 68 | Resp 18

## 2019-06-05 DIAGNOSIS — B351 Tinea unguium: Secondary | ICD-10-CM | POA: Diagnosis not present

## 2019-06-05 DIAGNOSIS — M79674 Pain in right toe(s): Secondary | ICD-10-CM | POA: Diagnosis not present

## 2019-06-05 DIAGNOSIS — M79675 Pain in left toe(s): Secondary | ICD-10-CM

## 2019-06-05 NOTE — Progress Notes (Signed)
Subjective:    Patient ID: Jacqueline Sanchez, female    DOB: 1919/08/02, 86 y.o.   MRN: [id number]  HPI  Resident seen in appt 216 Asking for nail trim Nails thick and long, getting caught on socks causing pain  Review of Systems      Past Medical History:  Diagnosis Date  . Aphasia 04/2016   Transient---??TIA  . Asthma   . CKD (chronic kidney disease)   . Edema   . Femur fracture, left ([place]) 09/06/2016  . GERD (gastroesophageal reflux disease)   . Hyperlipidemia   . Hypertension   . Osteoporosis     Current Outpatient Medications  Medication Sig Dispense Refill  . acetaminophen (TYLENOL) 650 MG CR tablet Take 650 mg by mouth 3 (three) times daily. With percocet    . amLODipine (NORVASC) 10 MG tablet Take 1 tablet (10 mg total) by mouth daily. (Patient taking differently: Take 10 mg by mouth every morning. ) 30 tablet 0  . cetirizine (ZYRTEC) 10 MG tablet Take 10 mg by mouth at bedtime.    . ferrous sulfate 325 (65 FE) MG tablet Take 325 mg by mouth daily with breakfast.    . gabapentin (NEURONTIN) 100 MG capsule Take 1 capsule (100 mg total) by mouth 3 (three) times daily. 30 capsule 0  . hydrALAZINE (APRESOLINE) 25 MG tablet Take 1 tablet (25 mg total) by mouth 4 (four) times daily. (Patient taking differently: Take 25 mg by mouth 3 (three) times daily. ) 120 tablet 1  . loratadine (CLARITIN) 10 MG tablet Take 10 mg by mouth daily.    . Melatonin 3 MG TABS Take 1 tablet by mouth at bedtime.    . metoprolol succinate (TOPROL-XL) 50 MG 24 hr tablet Take 1 tablet (50 mg total) by mouth daily. Take with or immediately following a meal. (Patient taking differently: Take 50 mg by mouth every morning. Take with or immediately following a meal.) 30 tablet 1  . Multiple Vitamin (MULTI-VITAMINS) TABS Take 1 tablet by mouth daily.    Marland Kitchen oxyCODONE (OXY IR/ROXICODONE) 5 MG immediate release tablet Take 5 mg by mouth 3 (three) times daily as needed for severe pain.    Marland Kitchen  oxyCODONE-acetaminophen (PERCOCET/ROXICET) 5-325 MG tablet Take 1 tablet by mouth 3 (three) times daily. And 1 tid prn     . polyethylene glycol (MIRALAX / GLYCOLAX) packet Take 17 g by mouth daily.    Marland Kitchen senna (SENOKOT) 8.6 MG tablet Take 2 tablets by mouth 2 (two) times daily.     . vitamin B-12 (CYANOCOBALAMIN) 1000 MCG tablet Take 1,000 mcg by mouth daily.    . Vitamin D, Cholecalciferol, 1000 units CAPS Take 1 capsule by mouth daily.     No current facility-administered medications for this visit.    Allergies  Allergen Reactions  . Penicillins     Many many many years ago   . Morphine And Related Nausea Only    Family History  Problem Relation Age of Onset  . Hypertension Other     Social History   Socioeconomic History  . Marital status: Widowed    Spouse name: Not on file  . Number of children: 2  . Years of education: Not on file  . Highest education level: Not on file  Occupational History  . Occupation: [employer]    Comment: Retired  Tobacco Use  . Smoking status: Never Smoker  . Smokeless tobacco: Never Used  Substance and Sexual Activity  . Alcohol use: No  .  Drug use: Not on file  . Sexual activity: Never  Other Topics Concern  . Not on file  Social History Narrative   Widowed 2000   Has living will   Sons/DILs are health care POAs (mostly Mike/Jan)   Has DNR   No tube feeds if cognitively unaware   Social Determinants of Health   Financial Resource Strain:   . Difficulty of Paying Living Expenses: Not on file  Food Insecurity:   . Worried About Charity fundraiser in the Last Year: Not on file  . Ran Out of Food in the Last Year: Not on file  Transportation Needs:   . Lack of Transportation (Medical): Not on file  . Lack of Transportation (Non-Medical): Not on file  Physical Activity:   . Days of Exercise per Week: Not on file  . Minutes of Exercise per Session: Not on file  Stress:   . Feeling of Stress : Not on file  Social  Connections:   . Frequency of Communication with Friends and Family: Not on file  . Frequency of Social Gatherings with Friends and Family: Not on file  . Attends Religious Services: Not on file  . Active Member of Clubs or Organizations: Not on file  . Attends Archivist Meetings: Not on file  . Marital Status: Not on file  Intimate Partner Violence:   . Fear of Current or Ex-Partner: Not on file  . Emotionally Abused: Not on file  . Physically Abused: Not on file  . Sexually Abused: Not on file     Constitutional: Denies fever, malaise, fatigue, headache or abrupt weight changes.  Skin: Pt reports mycotic toenail. Denies redness, rashes, lesions or ulcercations.    No other specific complaints in a complete review of systems (except as listed in HPI above).  Objective:   Physical Exam  115/69  68 18 Wt Readings from Last 3 Encounters:  04/07/19 125 lb 6.4 oz (56.9 kg)  08/03/18 119 lb 6.4 oz (54.2 kg)  06/27/18 123 lb 4.8 oz (55.9 kg)    General: Appears her stated age, well developed, well nourished in NAD. Skin: Mycotic toenails bilaterally. Neurological: Alert and oriented.   BMET    Component Value Date/Time   NA 136 12/03/2016 0431   NA 140 05/30/2011 1101   K 4.3 12/03/2016 0431   K 4.3 05/30/2011 1101   CL 102 12/03/2016 0431   CL 99 05/30/2011 1101   CO2 28 12/03/2016 0431   CO2 30 05/30/2011 1101   GLUCOSE 108 (H) 12/03/2016 0431   GLUCOSE 101 (H) 05/30/2011 1101   BUN 18 12/03/2016 0431   BUN 20 (H) 05/30/2011 1101   CREATININE 1.3 (A) 10/09/2017 0000   CREATININE 1.05 (H) 12/03/2016 0431   CREATININE 1.69 (H) 11/18/2011 1535   CALCIUM 10.0 12/03/2016 0431   CALCIUM 11.0 (H) 05/30/2011 1101   GFRNONAA 44 (L) 12/03/2016 0431   GFRNONAA 26 (L) 11/18/2011 1535   GFRAA 51 (L) 12/03/2016 0431   GFRAA 31 (L) 11/18/2011 1535    Lipid Panel  No results found for: CHOL, TRIG, HDL, CHOLHDL, VLDL, LDLCALC  CBC    Component Value Date/Time    WBC 11.3 (H) 12/03/2016 0431   RBC 3.11 (L) 12/03/2016 0431   HGB 10.1 (A) 10/09/2017 0000   HGB 12.7 05/30/2011 1101   HCT 30 (A) 10/09/2017 0000   HCT 37.8 05/30/2011 1101   PLT 266 12/03/2016 0431   PLT 237 05/30/2011 1101  MCV 97.9 12/03/2016 0431   MCV 101 (H) 05/30/2011 1101   MCH 32.9 12/03/2016 0431   MCHC 33.7 12/03/2016 0431   RDW 13.9 12/03/2016 0431   RDW 13.5 05/30/2011 1101   LYMPHSABS 0.7 (L) 09/06/2016 2338   MONOABS 1.0 (H) 09/06/2016 2338   EOSABS 0.0 09/06/2016 2338   BASOSABS 0.0 09/06/2016 2338    Hgb A1C No results found for: HGBA1C          Assessment & Plan:   Pain due to Mycotic Toenails:  Nails 1-5 b/l feet trimmed by provider using dremmel tool. Pt tolerated well. No complications  Webb Silversmith, NP

## 2019-06-05 NOTE — Patient Instructions (Signed)

## 2019-07-04 ENCOUNTER — Other Ambulatory Visit: Payer: Self-pay

## 2019-07-04 ENCOUNTER — Encounter: Payer: Self-pay | Admitting: Internal Medicine

## 2019-07-04 ENCOUNTER — Ambulatory Visit: Payer: Medicare PPO | Admitting: Internal Medicine

## 2019-07-04 VITALS — BP 154/69 | HR 69 | Temp 98.2°F | Resp 17 | Wt 120.8 lb

## 2019-07-04 DIAGNOSIS — M159 Polyosteoarthritis, unspecified: Secondary | ICD-10-CM

## 2019-07-04 DIAGNOSIS — I38 Endocarditis, valve unspecified: Secondary | ICD-10-CM | POA: Diagnosis not present

## 2019-07-04 DIAGNOSIS — F1120 Opioid dependence, uncomplicated: Secondary | ICD-10-CM

## 2019-07-04 DIAGNOSIS — Z8673 Personal history of transient ischemic attack (TIA), and cerebral infarction without residual deficits: Secondary | ICD-10-CM

## 2019-07-04 DIAGNOSIS — N1831 Chronic kidney disease, stage 3a: Secondary | ICD-10-CM | POA: Diagnosis not present

## 2019-07-04 DIAGNOSIS — I10 Essential (primary) hypertension: Secondary | ICD-10-CM

## 2019-07-04 NOTE — Assessment & Plan Note (Signed)
Is on ACEI Labs due again in May

## 2019-07-04 NOTE — Assessment & Plan Note (Signed)
Regimen monitored by staff here

## 2019-07-04 NOTE — Assessment & Plan Note (Addendum)
Sounds like AS No clear symptoms Echo 2017 favors this diagnosis

## 2019-07-04 NOTE — Assessment & Plan Note (Signed)
BP Readings from Last 3 Encounters:  07/04/19 (!) 154/69  06/05/19 115/69  04/07/19 140/69   Adequate control for her age

## 2019-07-04 NOTE — Assessment & Plan Note (Signed)
May have some mild cognitive residual now Maintains functional independence here with support of staff (for meds, supervision, etc) Is on ASA

## 2019-07-04 NOTE — Assessment & Plan Note (Signed)
Seems to be controlled with her narcotic/tylenol regimen Will try to wean the gabapentin to see if that is really needed

## 2019-07-04 NOTE — Progress Notes (Signed)
Subjective:    Patient ID: Jacqueline Sanchez, female    DOB: 02-22-1920, 86 y.o.   MRN: [id number]  HPI Visit in assisted living apartment for follow up of chronic health conditions Reviewed status with [name] RN  Doing "mostly okay" Her only concern is urinary frequency No dysuria or hematuria Usually not at night  No new neuro symptoms Continues on asa No new concerns about this from stafff  Still gets some vague chest pain under sternum---usually in evening. Gets better if she moves aroung No SOB No dizziness or syncope No sig edema  Continues on narcotic pain regimen She feels this is effective She doesn't think she uses the prn very often  On gabapentin for neuropathy still  GFR last 46 Will have repeat in May  Current Outpatient Medications on File Prior to Visit  Medication Sig Dispense Refill  . acetaminophen (TYLENOL) 650 MG CR tablet Take 650 mg by mouth 3 (three) times daily. With percocet    . amLODipine (NORVASC) 10 MG tablet Take 1 tablet (10 mg total) by mouth daily. (Patient taking differently: Take 10 mg by mouth every morning. ) 30 tablet 0  . aspirin EC 81 MG tablet Take 81 mg by mouth daily.    . cetirizine (ZYRTEC) 10 MG tablet Take 10 mg by mouth at bedtime.    . ferrous sulfate 325 (65 FE) MG tablet Take 325 mg by mouth daily with breakfast.    . gabapentin (NEURONTIN) 100 MG capsule Take 1 capsule (100 mg total) by mouth 3 (three) times daily. 30 capsule 0  . hydrALAZINE (APRESOLINE) 25 MG tablet Take 1 tablet (25 mg total) by mouth 4 (four) times daily. (Patient taking differently: Take 25 mg by mouth 3 (three) times daily. ) 120 tablet 1  . loratadine (CLARITIN) 10 MG tablet Take 10 mg by mouth daily.    . Melatonin 3 MG TABS Take 1 tablet by mouth at bedtime.    . metoprolol succinate (TOPROL-XL) 50 MG 24 hr tablet Take 1 tablet (50 mg total) by mouth daily. Take with or immediately following a meal. 30 tablet 1  . Multiple Vitamin  (MULTI-VITAMINS) TABS Take 1 tablet by mouth daily.    Marland Kitchen oxyCODONE (OXY IR/ROXICODONE) 5 MG immediate release tablet Take 5 mg by mouth 3 (three) times daily as needed for severe pain.    Marland Kitchen oxyCODONE-acetaminophen (PERCOCET/ROXICET) 5-325 MG tablet Take 1 tablet by mouth 3 (three) times daily. And 1 tid prn     . polyethylene glycol (MIRALAX / GLYCOLAX) packet Take 17 g by mouth daily.    Marland Kitchen senna (SENOKOT) 8.6 MG tablet Take 2 tablets by mouth 2 (two) times daily.     . vitamin B-12 (CYANOCOBALAMIN) 1000 MCG tablet Take 1,000 mcg by mouth daily.    . Vitamin D, Cholecalciferol, 1000 units CAPS Take 1 capsule by mouth daily.     No current facility-administered medications on file prior to visit.    Allergies  Allergen Reactions  . Penicillins     Many many many years ago   . Morphine And Related Nausea Only    Past Medical History:  Diagnosis Date  . Aphasia 04/2016   Transient---??TIA  . Asthma   . CKD (chronic kidney disease)   . Edema   . Femur fracture, left ([place]) 09/06/2016  . GERD (gastroesophageal reflux disease)   . Hyperlipidemia   . Hypertension   . Osteoporosis     Past Surgical History:  Procedure Laterality  Date  . ANTERIOR HIP REVISION Left 12/01/2016   Procedure: ANTERIOR HIP REVISION, REVISION TO TOTAL;  Surgeon: Hessie Knows, MD;  Location: [facility] OR[facility];  Service: Orthopedics;  Laterality: Left;  . HIP ARTHROPLASTY Left 09/07/2016   Procedure: ARTHROPLASTY BIPOLAR HIP (HEMIARTHROPLASTY);  Surgeon: Thornton Park, MD;  Location: [facility];  Service: Orthopedics;  Laterality: Left;  . HIP CLOSED REDUCTION Left 11/03/2016   Procedure: CLOSED REDUCTION HIP;  Surgeon: Thornton Park, MD;  Location: [facility];  Service: Orthopedics;  Laterality: Left;  . HIP CLOSED REDUCTION Left 11/24/2016   Procedure: CLOSED REDUCTION HIP;  Surgeon: Thornton Park, MD;  Location: [facility];  Service: Orthopedics;  Laterality: Left;  . KYPHOPLASTY      Family History  Problem  Relation Age of Onset  . Hypertension Other     Social History   Socioeconomic History  . Marital status: Widowed    Spouse name: Not on file  . Number of children: 2  . Years of education: Not on file  . Highest education level: Not on file  Occupational History  . Occupation: [employer]    Comment: Retired  Tobacco Use  . Smoking status: Never Smoker  . Smokeless tobacco: Never Used  Substance and Sexual Activity  . Alcohol use: No  . Drug use: Not on file  . Sexual activity: Never  Other Topics Concern  . Not on file  Social History Narrative   Widowed 2000   Has living will   Sons/DILs are health care POAs (mostly Mike/Jan)   Has DNR   No tube feeds if cognitively unaware   Social Determinants of Health   Financial Resource Strain:   . Difficulty of Paying Living Expenses: Not on file  Food Insecurity:   . Worried About Charity fundraiser in the Last Year: Not on file  . Ran Out of Food in the Last Year: Not on file  Transportation Needs:   . Lack of Transportation (Medical): Not on file  . Lack of Transportation (Non-Medical): Not on file  Physical Activity:   . Days of Exercise per Week: Not on file  . Minutes of Exercise per Session: Not on file  Stress:   . Feeling of Stress : Not on file  Social Connections:   . Frequency of Communication with Friends and Family: Not on file  . Frequency of Social Gatherings with Friends and Family: Not on file  . Attends Religious Services: Not on file  . Active Member of Clubs or Organizations: Not on file  . Attends Archivist Meetings: Not on file  . Marital Status: Not on file  Intimate Partner Violence:   . Fear of Current or Ex-Partner: Not on file  . Emotionally Abused: Not on file  . Physically Abused: Not on file  . Sexually Abused: Not on file   Review of Systems Appetite is okay Weight stable Sleeps well Bowels mostly move okay--continues on bowel regimen    Objective:   Physical  Exam  Constitutional: She appears well-developed. No distress.  Neck: No thyromegaly present.  Cardiovascular: Normal rate and regular rhythm. Exam reveals no gallop.  Gr 2/6 systolic murmur loudest at base  Respiratory: Effort normal and breath sounds normal. No respiratory distress.  GI: Soft. There is no abdominal tenderness.  Musculoskeletal:     Comments: Trace edema in feet/ankles  Lymphadenopathy:    She has no cervical adenopathy.  Neurological:  Mild cognitive slowing---trouble answering questions, etc No focal weakness  Skin:  No rash noted.  Psychiatric: She has a normal mood and affect.           Assessment & Plan:

## 2019-09-23 DIAGNOSIS — R278 Other lack of coordination: Secondary | ICD-10-CM | POA: Diagnosis not present

## 2019-09-23 DIAGNOSIS — M6281 Muscle weakness (generalized): Secondary | ICD-10-CM | POA: Diagnosis not present

## 2019-09-23 DIAGNOSIS — Z9181 History of falling: Secondary | ICD-10-CM | POA: Diagnosis not present

## 2019-09-23 DIAGNOSIS — M810 Age-related osteoporosis without current pathological fracture: Secondary | ICD-10-CM | POA: Diagnosis not present

## 2019-09-23 DIAGNOSIS — R2689 Other abnormalities of gait and mobility: Secondary | ICD-10-CM | POA: Diagnosis not present

## 2019-09-23 DIAGNOSIS — Z96642 Presence of left artificial hip joint: Secondary | ICD-10-CM | POA: Diagnosis not present

## 2019-09-25 DIAGNOSIS — R2689 Other abnormalities of gait and mobility: Secondary | ICD-10-CM | POA: Diagnosis not present

## 2019-09-25 DIAGNOSIS — M6281 Muscle weakness (generalized): Secondary | ICD-10-CM | POA: Diagnosis not present

## 2019-09-25 DIAGNOSIS — Z9181 History of falling: Secondary | ICD-10-CM | POA: Diagnosis not present

## 2019-09-25 DIAGNOSIS — Z96642 Presence of left artificial hip joint: Secondary | ICD-10-CM | POA: Diagnosis not present

## 2019-09-25 DIAGNOSIS — M810 Age-related osteoporosis without current pathological fracture: Secondary | ICD-10-CM | POA: Diagnosis not present

## 2019-09-25 DIAGNOSIS — R278 Other lack of coordination: Secondary | ICD-10-CM | POA: Diagnosis not present

## 2019-09-27 DIAGNOSIS — M6281 Muscle weakness (generalized): Secondary | ICD-10-CM | POA: Diagnosis not present

## 2019-09-27 DIAGNOSIS — M810 Age-related osteoporosis without current pathological fracture: Secondary | ICD-10-CM | POA: Diagnosis not present

## 2019-09-27 DIAGNOSIS — Z9181 History of falling: Secondary | ICD-10-CM | POA: Diagnosis not present

## 2019-09-27 DIAGNOSIS — R278 Other lack of coordination: Secondary | ICD-10-CM | POA: Diagnosis not present

## 2019-09-27 DIAGNOSIS — Z96642 Presence of left artificial hip joint: Secondary | ICD-10-CM | POA: Diagnosis not present

## 2019-09-27 DIAGNOSIS — R2689 Other abnormalities of gait and mobility: Secondary | ICD-10-CM | POA: Diagnosis not present

## 2019-10-03 ENCOUNTER — Encounter: Payer: Self-pay | Admitting: Internal Medicine

## 2019-10-03 DIAGNOSIS — M810 Age-related osteoporosis without current pathological fracture: Secondary | ICD-10-CM | POA: Diagnosis not present

## 2019-10-03 DIAGNOSIS — E785 Hyperlipidemia, unspecified: Secondary | ICD-10-CM | POA: Diagnosis not present

## 2019-10-03 DIAGNOSIS — I10 Essential (primary) hypertension: Secondary | ICD-10-CM | POA: Diagnosis not present

## 2019-10-11 ENCOUNTER — Ambulatory Visit: Payer: Medicare PPO | Admitting: Internal Medicine

## 2019-10-16 ENCOUNTER — Ambulatory Visit: Payer: Medicare PPO | Admitting: Internal Medicine

## 2019-10-16 DIAGNOSIS — E7800 Pure hypercholesterolemia, unspecified: Secondary | ICD-10-CM | POA: Diagnosis not present

## 2019-10-16 DIAGNOSIS — K219 Gastro-esophageal reflux disease without esophagitis: Secondary | ICD-10-CM | POA: Diagnosis not present

## 2019-10-16 DIAGNOSIS — M816 Localized osteoporosis [Lequesne]: Secondary | ICD-10-CM

## 2019-10-16 DIAGNOSIS — F1120 Opioid dependence, uncomplicated: Secondary | ICD-10-CM | POA: Diagnosis not present

## 2019-10-16 DIAGNOSIS — D508 Other iron deficiency anemias: Secondary | ICD-10-CM

## 2019-10-16 DIAGNOSIS — I10 Essential (primary) hypertension: Secondary | ICD-10-CM | POA: Diagnosis not present

## 2019-10-16 DIAGNOSIS — Z8673 Personal history of transient ischemic attack (TIA), and cerebral infarction without residual deficits: Secondary | ICD-10-CM

## 2019-10-16 DIAGNOSIS — N1831 Chronic kidney disease, stage 3a: Secondary | ICD-10-CM | POA: Diagnosis not present

## 2019-10-16 DIAGNOSIS — M159 Polyosteoarthritis, unspecified: Secondary | ICD-10-CM

## 2019-10-17 ENCOUNTER — Encounter: Payer: Self-pay | Admitting: Internal Medicine

## 2019-10-17 MED ORDER — LISINOPRIL 10 MG PO TABS: 10.0000 mg | ORAL_TABLET | Freq: Every day | ORAL | 3 refills | Status: AC

## 2019-10-17 NOTE — Patient Instructions (Signed)

## 2019-10-17 NOTE — Assessment & Plan Note (Signed)
Continue Calcium and Vit D

## 2019-10-17 NOTE — Assessment & Plan Note (Signed)
Pain seems well controlled on Tylenol, Percocet and Gabapentin We will monitor

## 2019-10-17 NOTE — Assessment & Plan Note (Signed)
Will add Lisinopril 10 mg for improved BP, stroke reduction and renal protection

## 2019-10-17 NOTE — Assessment & Plan Note (Signed)
Continue oral iron CBC yearly

## 2019-10-17 NOTE — Assessment & Plan Note (Signed)
Continue ASA Appreciate ALF care Will aim for better BP control by adding Lisinopril 10 mg

## 2019-10-17 NOTE — Progress Notes (Signed)
Subjective:    Patient ID: Jacqueline Sanchez, female    DOB: 1919/06/04, 86 y.o.   MRN: [id number]  HPI  Resident seen in [address] for routine follow-up. No new concerns from staff or resident. She reports she sleeps well.  She has an aide that comes and helps her with bathing and dressing daily.  She walks with a walker and denies recent falls.  Her appetite is fair, weight 116.2 pounds, down 4 pounds.  She has some urinary incontinence but manages this herself.  Her bowels are moving okay with MiraLAX and senna.  She denies current pain, chest pain, shortness of breath or reflux.  Hx of TIA: With residual expressive aphasia.  She is taking aspirin daily, no longer on statin therapy.  HTN: Her BP has been variable from 250-539 systolic.  Currently managed on Amlodipine, Hydralazine, and Metoprolol.  IDA: Her last H/H was 9.7/29.3, 09/2018.  She is taking an iron supplement over-the-counter as prescribed.  OA: She denies breakthrough pain on Tylenol, Percocet and Gabapentin.  Insomnia: She sleeps well with the use of Melatonin.  Osteoporosis: She is taking vitamin D and Calcium as prescribed.  She is getting some weightbearing in daily.  HLD: No longer on statin therapy.  She consumes a low-fat diet.  GERD: Currently not an issue off meds.  CKD: GFR 46.  She is not on an ACE or an ARB.   Review of Systems  Past Medical History:  Diagnosis Date  . Aphasia 04/2016   Transient---??TIA  . Asthma   . CKD (chronic kidney disease)   . Edema   . Femur fracture, left ([place]) 09/06/2016  . GERD (gastroesophageal reflux disease)   . Hyperlipidemia   . Hypertension   . Osteoporosis     Current Outpatient Medications  Medication Sig Dispense Refill  . acetaminophen (TYLENOL) 650 MG CR tablet Take 650 mg by mouth 3 (three) times daily. With percocet    . amLODipine (NORVASC) 10 MG tablet Take 1 tablet (10 mg total) by mouth daily. (Patient taking differently: Take 10 mg by mouth every  morning. ) 30 tablet 0  . aspirin EC 81 MG tablet Take 81 mg by mouth daily.    . cetirizine (ZYRTEC) 10 MG tablet Take 10 mg by mouth at bedtime.    . ferrous sulfate 325 (65 FE) MG tablet Take 325 mg by mouth daily with breakfast.    . gabapentin (NEURONTIN) 100 MG capsule Take 1 capsule (100 mg total) by mouth 3 (three) times daily. 30 capsule 0  . hydrALAZINE (APRESOLINE) 25 MG tablet Take 1 tablet (25 mg total) by mouth 4 (four) times daily. (Patient taking differently: Take 25 mg by mouth 3 (three) times daily. ) 120 tablet 1  . loratadine (CLARITIN) 10 MG tablet Take 10 mg by mouth daily.    . Melatonin 3 MG TABS Take 1 tablet by mouth at bedtime.    . metoprolol succinate (TOPROL-XL) 50 MG 24 hr tablet Take 1 tablet (50 mg total) by mouth daily. Take with or immediately following a meal. 30 tablet 1  . Multiple Vitamin (MULTI-VITAMINS) TABS Take 1 tablet by mouth daily.    Marland Kitchen oxyCODONE (OXY IR/ROXICODONE) 5 MG immediate release tablet Take 5 mg by mouth 3 (three) times daily as needed for severe pain.    Marland Kitchen oxyCODONE-acetaminophen (PERCOCET/ROXICET) 5-325 MG tablet Take 1 tablet by mouth 3 (three) times daily. And 1 tid prn     . polyethylene glycol (MIRALAX /  GLYCOLAX) packet Take 17 g by mouth daily.    Marland Kitchen senna (SENOKOT) 8.6 MG tablet Take 2 tablets by mouth 2 (two) times daily.     . vitamin B-12 (CYANOCOBALAMIN) 1000 MCG tablet Take 1,000 mcg by mouth daily.    . Vitamin D, Cholecalciferol, 1000 units CAPS Take 1 capsule by mouth daily.     No current facility-administered medications for this visit.    Allergies  Allergen Reactions  . Penicillins     Many many many years ago   . Morphine And Related Nausea Only    Family History  Problem Relation Age of Onset  . Hypertension Other     Social History   Socioeconomic History  . Marital status: Widowed    Spouse name: Not on file  . Number of children: 2  . Years of education: Not on file  . Highest education level:  Not on file  Occupational History  . Occupation: [employer]--[place]    Comment: Retired  Tobacco Use  . Smoking status: Never Smoker  . Smokeless tobacco: Never Used  Substance and Sexual Activity  . Alcohol use: No  . Drug use: Not on file  . Sexual activity: Never  Other Topics Concern  . Not on file  Social History Narrative   Widowed 2000   Has living will   Sons/DILs are health care POAs (mostly Mike/Jan)   Has DNR   No tube feeds if cognitively unaware   Social Determinants of Health   Financial Resource Strain:   . Difficulty of Paying Living Expenses:   Food Insecurity:   . Worried About Charity fundraiser in the Last Year:   . Arboriculturist in the Last Year:   Transportation Needs:   . Film/video editor (Medical):   Marland Kitchen Lack of Transportation (Non-Medical):   Physical Activity:   . Days of Exercise per Week:   . Minutes of Exercise per Session:   Stress:   . Feeling of Stress :   Social Connections:   . Frequency of Communication with Friends and Family:   . Frequency of Social Gatherings with Friends and Family:   . Attends Religious Services:   . Active Member of Clubs or Organizations:   . Attends Archivist Meetings:   Marland Kitchen Marital Status:   Intimate Partner Violence:   . Fear of Current or Ex-Partner:   . Emotionally Abused:   Marland Kitchen Physically Abused:   . Sexually Abused:      Constitutional: Denies fever, malaise, fatigue, headache or abrupt weight changes.  HEENT: Denies eye pain, eye redness, ear pain, ringing in the ears, wax buildup, runny nose, nasal congestion, bloody nose, or sore throat. Respiratory: Denies difficulty breathing, shortness of breath, cough or sputum production.   Cardiovascular: Denies chest pain, chest tightness, palpitations or swelling in the hands or feet.  Gastrointestinal: Pt reports intermittent constipation. Denies abdominal pain, bloating, diarrhea or blood in the stool.  GU: Pt reports urinary  incontinence. Denies urgency, frequency, pain with urination, burning sensation, blood in urine, odor or discharge. Musculoskeletal: Pt reports chronic joint pain. Denies decrease in range of motion, difficulty with gait, muscle pain or joint swelling.  Skin: Denies redness, rashes, lesions or ulcercations.  Neurological: Denies dizziness, difficulty with memory, difficulty with speech or problems with balance and coordination.  Psych: Denies anxiety, depression, SI/HI.  No other specific complaints in a complete review of systems (except as listed in HPI above).  Objective:   Physical Exam  BP (!) 178/71   Pulse 83   Temp 98.7 F (37.1 C)   Resp 18   Wt 116 lb 3.2 oz (52.7 kg)   BMI 19.95 kg/m   Wt Readings from Last 3 Encounters:  10/17/19 116 lb 3.2 oz (52.7 kg)  07/04/19 120 lb 12.8 oz (54.8 kg)  04/07/19 125 lb 6.4 oz (56.9 kg)    General: Appears her stated age, well developed, well nourished in NAD. Neck:  Neck supple, trachea midline. No masses, lumps or thyromegaly present.  Cardiovascular: Normal rate and rhythm. S1,S2 noted.  No murmur, rubs or gallops noted. No JVD or BLE edema. Pulmonary/Chest: Normal effort and positive vesicular breath sounds. No respiratory distress. No wheezes, rales or ronchi noted.  Abdomen: Soft and nontender. Normal bowel sounds.  Neurological: Alert and oriented. Expressive aphasia noted.   BMET    Component Value Date/Time   NA 136 12/03/2016 0431   NA 140 05/30/2011 1101   K 4.3 12/03/2016 0431   K 4.3 05/30/2011 1101   CL 102 12/03/2016 0431   CL 99 05/30/2011 1101   CO2 28 12/03/2016 0431   CO2 30 05/30/2011 1101   GLUCOSE 108 (H) 12/03/2016 0431   GLUCOSE 101 (H) 05/30/2011 1101   BUN 18 12/03/2016 0431   BUN 20 (H) 05/30/2011 1101   CREATININE 1.3 (A) 10/09/2017 0000   CREATININE 1.05 (H) 12/03/2016 0431   CREATININE 1.69 (H) 11/18/2011 1535   CALCIUM 10.0 12/03/2016 0431   CALCIUM 11.0 (H) 05/30/2011 1101    GFRNONAA 44 (L) 12/03/2016 0431   GFRNONAA 26 (L) 11/18/2011 1535   GFRAA 51 (L) 12/03/2016 0431   GFRAA 31 (L) 11/18/2011 1535    Lipid Panel  No results found for: CHOL, TRIG, HDL, CHOLHDL, VLDL, LDLCALC  CBC    Component Value Date/Time   WBC 11.3 (H) 12/03/2016 0431   RBC 3.11 (L) 12/03/2016 0431   HGB 10.1 (A) 10/09/2017 0000   HGB 12.7 05/30/2011 1101   HCT 30 (A) 10/09/2017 0000   HCT 37.8 05/30/2011 1101   PLT 266 12/03/2016 0431   PLT 237 05/30/2011 1101   MCV 97.9 12/03/2016 0431   MCV 101 (H) 05/30/2011 1101   MCH 32.9 12/03/2016 0431   MCHC 33.7 12/03/2016 0431   RDW 13.9 12/03/2016 0431   RDW 13.5 05/30/2011 1101   LYMPHSABS 0.7 (L) 09/06/2016 2338   MONOABS 1.0 (H) 09/06/2016 2338   EOSABS 0.0 09/06/2016 2338   BASOSABS 0.0 09/06/2016 2338    Hgb A1C No results found for: HGBA1C          Assessment & Plan:   Jacqueline Silversmith, NP This visit occurred during the SARS-CoV-2 public health emergency.  Safety protocols were in place, including screening questions prior to the visit, additional usage of staff PPE, and extensive cleaning of exam room while observing appropriate contact time as indicated for disinfecting solutions.

## 2019-10-17 NOTE — Assessment & Plan Note (Signed)
Currently not an issue off meds We will monitor 

## 2019-10-17 NOTE — Assessment & Plan Note (Signed)
Continue Percocet for now

## 2019-10-17 NOTE — Assessment & Plan Note (Signed)
Off statin Will monitor

## 2019-10-17 NOTE — Assessment & Plan Note (Addendum)
Uncontrolled Will add Lisinopril 10 mg daily for improved blood pressure control, stroke reduction and kidney protection

## 2020-01-30 ENCOUNTER — Ambulatory Visit: Payer: Medicare PPO | Admitting: Internal Medicine

## 2020-01-30 ENCOUNTER — Other Ambulatory Visit: Payer: Self-pay

## 2020-01-30 ENCOUNTER — Encounter: Payer: Self-pay | Admitting: Internal Medicine

## 2020-01-30 DIAGNOSIS — K219 Gastro-esophageal reflux disease without esophagitis: Secondary | ICD-10-CM | POA: Diagnosis not present

## 2020-01-30 DIAGNOSIS — I10 Essential (primary) hypertension: Secondary | ICD-10-CM | POA: Diagnosis not present

## 2020-01-30 DIAGNOSIS — I69320 Aphasia following cerebral infarction: Secondary | ICD-10-CM

## 2020-01-30 DIAGNOSIS — F39 Unspecified mood [affective] disorder: Secondary | ICD-10-CM

## 2020-01-30 DIAGNOSIS — M159 Polyosteoarthritis, unspecified: Secondary | ICD-10-CM

## 2020-01-30 DIAGNOSIS — N1831 Chronic kidney disease, stage 3a: Secondary | ICD-10-CM

## 2020-01-30 DIAGNOSIS — F1120 Opioid dependence, uncomplicated: Secondary | ICD-10-CM | POA: Diagnosis not present

## 2020-01-30 NOTE — Assessment & Plan Note (Signed)
No symptoms off meds

## 2020-01-30 NOTE — Progress Notes (Signed)
Subjective:    Patient ID: Jacqueline Sanchez, female    DOB: Sep 19, 1919, 86 y.o.   MRN: [id number]  HPI Visit in [facility] apartment for review of chronic health conditions Reviewed status with [name] RN  Doing okay 2 recent family deaths though---including brother  Episodic elevated BP Started on lisinopril at last visit No chest pain or SOB No headaches No dizziness or syncope Slight edema  Walks with rollator Daily aide to help with bathing, dressing Some urinary incontinence---wears pad and she manages this  Arthritis pain is controlled  Oxycodone seems to control adequately  No heartburn or dysphagia  Current Outpatient Medications on File Prior to Visit  Medication Sig Dispense Refill  . acetaminophen (TYLENOL) 650 MG CR tablet Take 650 mg by mouth 3 (three) times daily. With percocet    . amLODipine (NORVASC) 10 MG tablet Take 1 tablet (10 mg total) by mouth daily. (Patient taking differently: Take 10 mg by mouth every morning. ) 30 tablet 0  . aspirin EC 81 MG tablet Take 81 mg by mouth daily.    . cetirizine (ZYRTEC) 10 MG tablet Take 10 mg by mouth at bedtime.    . ferrous sulfate 325 (65 FE) MG tablet Take 325 mg by mouth daily with breakfast.    . hydrALAZINE (APRESOLINE) 25 MG tablet Take 1 tablet (25 mg total) by mouth 4 (four) times daily. (Patient taking differently: Take 25 mg by mouth 3 (three) times daily. ) 120 tablet 1  . lisinopril (ZESTRIL) 10 MG tablet Take 1 tablet (10 mg total) by mouth daily. 90 tablet 3  . loratadine (CLARITIN) 10 MG tablet Take 10 mg by mouth daily.    . Melatonin 3 MG TABS Take 1 tablet by mouth at bedtime.    . metoprolol succinate (TOPROL-XL) 50 MG 24 hr tablet Take 1 tablet (50 mg total) by mouth daily. Take with or immediately following a meal. 30 tablet 1  . Multiple Vitamin (MULTI-VITAMINS) TABS Take 1 tablet by mouth daily.    Marland Kitchen oxyCODONE (OXY IR/ROXICODONE) 5 MG immediate release tablet Take 5 mg by mouth 3 (three) times daily  as needed for severe pain.    . polyethylene glycol (MIRALAX / GLYCOLAX) packet Take 17 g by mouth daily.    Marland Kitchen senna (SENOKOT) 8.6 MG tablet Take 2 tablets by mouth 2 (two) times daily.     . vitamin B-12 (CYANOCOBALAMIN) 1000 MCG tablet Take 1,000 mcg by mouth daily.    . Vitamin D, Cholecalciferol, 1000 units CAPS Take 1 capsule by mouth daily.    Marland Kitchen gabapentin (NEURONTIN) 100 MG capsule Take 1 capsule (100 mg total) by mouth 3 (three) times daily. 30 capsule 0   No current facility-administered medications on file prior to visit.    Allergies  Allergen Reactions  . Penicillins     Many many many years ago   . Morphine And Related Nausea Only    Past Medical History:  Diagnosis Date  . Aphasia 04/2016   Transient---??TIA  . Asthma   . CKD (chronic kidney disease)   . Edema   . Femur fracture, left ([place]) 09/06/2016  . GERD (gastroesophageal reflux disease)   . Hyperlipidemia   . Hypertension   . Osteoporosis     Past Surgical History:  Procedure Laterality Date  . ANTERIOR HIP REVISION Left 12/01/2016   Procedure: ANTERIOR HIP REVISION, REVISION TO TOTAL;  Surgeon: Hessie Knows, MD;  Location: [facility] ORS;  Service: Orthopedics;  Laterality: Left;  .  HIP ARTHROPLASTY Left 09/07/2016   Procedure: ARTHROPLASTY BIPOLAR HIP (HEMIARTHROPLASTY);  Surgeon: Thornton Park, MD;  Location: [facility] ORS;  Service: Orthopedics;  Laterality: Left;  . HIP CLOSED REDUCTION Left 11/03/2016   Procedure: CLOSED REDUCTION HIP;  Surgeon: Thornton Park, MD;  Location: [facility] ORS;  Service: Orthopedics;  Laterality: Left;  . HIP CLOSED REDUCTION Left 11/24/2016   Procedure: CLOSED REDUCTION HIP;  Surgeon: Thornton Park, MD;  Location: [facility] ORS;  Service: Orthopedics;  Laterality: Left;  . KYPHOPLASTY      Family History  Problem Relation Age of Onset  . Hypertension Other     Social History   Socioeconomic History  . Marital status: Widowed    Spouse name: Not on file  . Number of  children: 2  . Years of education: Not on file  . Highest education level: Not on file  Occupational History  . Occupation: [employer]--[place]    Comment: Retired  Tobacco Use  . Smoking status: Never Smoker  . Smokeless tobacco: Never Used  Substance and Sexual Activity  . Alcohol use: No  . Drug use: Not on file  . Sexual activity: Never  Other Topics Concern  . Not on file  Social History Narrative   Widowed 2000   Has living will   Sons/DILs are health care POAs (mostly Mike/Jan)   Has DNR   No tube feeds if cognitively unaware   Social Determinants of Health   Financial Resource Strain:   . Difficulty of Paying Living Expenses: Not on file  Food Insecurity:   . Worried About Charity fundraiser in the Last Year: Not on file  . Ran Out of Food in the Last Year: Not on file  Transportation Needs:   . Lack of Transportation (Medical): Not on file  . Lack of Transportation (Non-Medical): Not on file  Physical Activity:   . Days of Exercise per Week: Not on file  . Minutes of Exercise per Session: Not on file  Stress:   . Feeling of Stress : Not on file  Social Connections:   . Frequency of Communication with Friends and Family: Not on file  . Frequency of Social Gatherings with Friends and Family: Not on file  . Attends Religious Services: Not on file  . Active Member of Clubs or Organizations: Not on file  . Attends Archivist Meetings: Not on file  . Marital Status: Not on file  Intimate Partner Violence:   . Fear of Current or Ex-Partner: Not on file  . Emotionally Abused: Not on file  . Physically Abused: Not on file  . Sexually Abused: Not on file   Review of Systems No cough or throat irritation Appetite is fine Sleeps well    Objective:   Physical Exam Constitutional:      Appearance: Normal appearance.  Cardiovascular:     Rate and Rhythm: Normal rate and regular rhythm.     Heart sounds: No gallop.      Comments: Soft systolic murmur  along left sternal border Pulmonary:     Effort: Pulmonary effort is normal.     Breath sounds: Normal breath sounds. No wheezing or rales.  Abdominal:     Palpations: Abdomen is soft.     Tenderness: There is no abdominal tenderness.  Musculoskeletal:     Cervical back: Neck supple.     Right lower leg: No edema.     Left lower leg: No edema.  Lymphadenopathy:     Cervical: No  cervical adenopathy.  Neurological:     Mental Status: She is alert.     Comments: Mild aphasia  Psychiatric:        Mood and Affect: Mood normal.        Behavior: Behavior normal.            Assessment & Plan:

## 2020-01-30 NOTE — Assessment & Plan Note (Signed)
Now on ACEI also Will check labs soon

## 2020-01-30 NOTE — Assessment & Plan Note (Signed)
This is mild and she is able to communicate Doing okay in AL with extra help

## 2020-01-30 NOTE — Assessment & Plan Note (Signed)
Monitored by staff

## 2020-01-30 NOTE — Assessment & Plan Note (Signed)
Now grieving the loss of her brother and daughter in law Doesn't seem to have complicated grieving or secondary depression The pandemic doesn't help Will just monitor

## 2020-01-30 NOTE — Assessment & Plan Note (Signed)
Pain is controlled with the oxycodone and tylenol

## 2020-01-30 NOTE — Assessment & Plan Note (Signed)
BP Readings from Last 3 Encounters:  01/30/20 (!) 136/56  10/17/19 (!) 178/71  07/04/19 (!) 154/69   I believe she may have some pseudohypertension---I hear a sound at 160 but then nothing till under 140 Palpable is 135 Continue amlodipine, metoprolol, hydralazine, lisinopril

## 2020-02-05 ENCOUNTER — Encounter: Payer: Self-pay | Admitting: Internal Medicine

## 2020-02-05 ENCOUNTER — Other Ambulatory Visit: Payer: Self-pay

## 2020-02-05 ENCOUNTER — Ambulatory Visit: Payer: Medicare PPO | Admitting: Internal Medicine

## 2020-02-05 VITALS — BP 147/68 | HR 77 | Temp 98.7°F | Resp 18 | Wt 118.2 lb

## 2020-02-05 DIAGNOSIS — B351 Tinea unguium: Secondary | ICD-10-CM

## 2020-02-05 DIAGNOSIS — M79674 Pain in right toe(s): Secondary | ICD-10-CM

## 2020-02-05 DIAGNOSIS — M79675 Pain in left toe(s): Secondary | ICD-10-CM

## 2020-02-05 NOTE — Patient Instructions (Signed)

## 2020-02-05 NOTE — Progress Notes (Signed)
Subjective:    Patient ID: Jacqueline Sanchez, female    DOB: Mar 25, 1920, 86 y.o.   MRN: [id number]  HPI  Asked to see resident in [address] Requesting nail trim. Nails thick, long getting caught on socks causing pain No history of DM 2  Review of Systems      Past Medical History:  Diagnosis Date  . Aphasia 04/2016   Transient---??TIA  . Asthma   . CKD (chronic kidney disease)   . Edema   . Femur fracture, left ([place]) 09/06/2016  . GERD (gastroesophageal reflux disease)   . Hyperlipidemia   . Hypertension   . Osteoporosis     Current Outpatient Medications  Medication Sig Dispense Refill  . acetaminophen (TYLENOL) 650 MG CR tablet Take 650 mg by mouth 3 (three) times daily. With percocet    . amLODipine (NORVASC) 10 MG tablet Take 1 tablet (10 mg total) by mouth daily. (Patient taking differently: Take 10 mg by mouth every morning. ) 30 tablet 0  . aspirin EC 81 MG tablet Take 81 mg by mouth daily.    . cetirizine (ZYRTEC) 10 MG tablet Take 10 mg by mouth at bedtime.    . ferrous sulfate 325 (65 FE) MG tablet Take 325 mg by mouth daily with breakfast.    . gabapentin (NEURONTIN) 100 MG capsule Take 1 capsule (100 mg total) by mouth 3 (three) times daily. 30 capsule 0  . hydrALAZINE (APRESOLINE) 25 MG tablet Take 1 tablet (25 mg total) by mouth 4 (four) times daily. (Patient taking differently: Take 25 mg by mouth 3 (three) times daily. ) 120 tablet 1  . lisinopril (ZESTRIL) 10 MG tablet Take 1 tablet (10 mg total) by mouth daily. 90 tablet 3  . loratadine (CLARITIN) 10 MG tablet Take 10 mg by mouth daily.    . Melatonin 3 MG TABS Take 1 tablet by mouth at bedtime.    . metoprolol succinate (TOPROL-XL) 50 MG 24 hr tablet Take 1 tablet (50 mg total) by mouth daily. Take with or immediately following a meal. 30 tablet 1  . Multiple Vitamin (MULTI-VITAMINS) TABS Take 1 tablet by mouth daily.    Marland Kitchen oxyCODONE (OXY IR/ROXICODONE) 5 MG immediate release tablet Take 5 mg by mouth 3  (three) times daily as needed for severe pain.    . polyethylene glycol (MIRALAX / GLYCOLAX) packet Take 17 g by mouth daily.    Marland Kitchen senna (SENOKOT) 8.6 MG tablet Take 2 tablets by mouth 2 (two) times daily.     . vitamin B-12 (CYANOCOBALAMIN) 1000 MCG tablet Take 1,000 mcg by mouth daily.    . Vitamin D, Cholecalciferol, 1000 units CAPS Take 1 capsule by mouth daily.     No current facility-administered medications for this visit.    Allergies  Allergen Reactions  . Penicillins     Many many many years ago   . Morphine And Related Nausea Only    Family History  Problem Relation Age of Onset  . Hypertension Other     Social History   Socioeconomic History  . Marital status: Widowed    Spouse name: Not on file  . Number of children: 2  . Years of education: Not on file  . Highest education level: Not on file  Occupational History  . Occupation: [employer]    Comment: Retired  Tobacco Use  . Smoking status: Never Smoker  . Smokeless tobacco: Never Used  Substance and Sexual Activity  . Alcohol use: No  .  Drug use: Not on file  . Sexual activity: Never  Other Topics Concern  . Not on file  Social History Narrative   Widowed 2000   Has living will   Sons/DILs are health care POAs (mostly Mike/Jan)   Has DNR   No tube feeds if cognitively unaware   Social Determinants of Health   Financial Resource Strain:   . Difficulty of Paying Living Expenses: Not on file  Food Insecurity:   . Worried About Charity fundraiser in the Last Year: Not on file  . Ran Out of Food in the Last Year: Not on file  Transportation Needs:   . Lack of Transportation (Medical): Not on file  . Lack of Transportation (Non-Medical): Not on file  Physical Activity:   . Days of Exercise per Week: Not on file  . Minutes of Exercise per Session: Not on file  Stress:   . Feeling of Stress : Not on file  Social Connections:   . Frequency of Communication with Friends and Family: Not on  file  . Frequency of Social Gatherings with Friends and Family: Not on file  . Attends Religious Services: Not on file  . Active Member of Clubs or Organizations: Not on file  . Attends Archivist Meetings: Not on file  . Marital Status: Not on file  Intimate Partner Violence:   . Fear of Current or Ex-Partner: Not on file  . Emotionally Abused: Not on file  . Physically Abused: Not on file  . Sexually Abused: Not on file     Constitutional: Denies fever, malaise, fatigue, headache or abrupt weight changes.  Respiratory: Denies difficulty breathing, shotness of breath, cough or sputum production.   Cardiovascular: Denies chest pain, chest tightness, palpitations or swelling in the hands or feet.  Skin: Pt reports mycotic toenails. Denies redness, rashes, lesions or ulcercations.    No other specific complaints in a complete review of systems (except as listed in HPI above).  Objective:   Physical Exam  BP (!) 147/68   Pulse 77   Temp 98.7 F (37.1 C)   Resp 18   Wt 118 lb 3.2 oz (53.6 kg)   SpO2 97%   BMI 20.29 kg/m  Wt Readings from Last 3 Encounters:  02/05/20 118 lb 3.2 oz (53.6 kg)  01/30/20 114 lb 3.2 oz (51.8 kg)  10/17/19 116 lb 3.2 oz (52.7 kg)    General: Appears herstated age, well developed, well nourished in NAD. Skin: Mycotic toenails bilaterally. Cardiovascular: Normal rate. Pulmonary/Chest: Normal effort. Neurological: Alert and oriented.   BMET    Component Value Date/Time   NA 136 12/03/2016 0431   NA 140 05/30/2011 1101   K 4.3 12/03/2016 0431   K 4.3 05/30/2011 1101   CL 102 12/03/2016 0431   CL 99 05/30/2011 1101   CO2 28 12/03/2016 0431   CO2 30 05/30/2011 1101   GLUCOSE 108 (H) 12/03/2016 0431   GLUCOSE 101 (H) 05/30/2011 1101   BUN 18 12/03/2016 0431   BUN 20 (H) 05/30/2011 1101   CREATININE 1.3 (A) 10/09/2017 0000   CREATININE 1.05 (H) 12/03/2016 0431   CREATININE 1.69 (H) 11/18/2011 1535   CALCIUM 10.0 12/03/2016 0431    CALCIUM 11.0 (H) 05/30/2011 1101   GFRNONAA 44 (L) 12/03/2016 0431   GFRNONAA 26 (L) 11/18/2011 1535   GFRAA 51 (L) 12/03/2016 0431   GFRAA 31 (L) 11/18/2011 1535    Lipid Panel  No results found for: CHOL, TRIG,  HDL, CHOLHDL, VLDL, LDLCALC  CBC    Component Value Date/Time   WBC 11.3 (H) 12/03/2016 0431   RBC 3.11 (L) 12/03/2016 0431   HGB 10.1 (A) 10/09/2017 0000   HGB 12.7 05/30/2011 1101   HCT 30 (A) 10/09/2017 0000   HCT 37.8 05/30/2011 1101   PLT 266 12/03/2016 0431   PLT 237 05/30/2011 1101   MCV 97.9 12/03/2016 0431   MCV 101 (H) 05/30/2011 1101   MCH 32.9 12/03/2016 0431   MCHC 33.7 12/03/2016 0431   RDW 13.9 12/03/2016 0431   RDW 13.5 05/30/2011 1101   LYMPHSABS 0.7 (L) 09/06/2016 2338   MONOABS 1.0 (H) 09/06/2016 2338   EOSABS 0.0 09/06/2016 2338   BASOSABS 0.0 09/06/2016 2338    Hgb A1C No results found for: HGBA1C          Assessment & Plan:   Pain due to Mycotic Toenails:  Nails 1-5 b/l feet trimmed by provider using dremmel tool.  Pt tolerated well, no complications  Will reassess as needed Webb Silversmith, NP This visit occurred during the SARS-CoV-2 public health emergency.  Safety protocols were in place, including screening questions prior to the visit, additional usage of staff PPE, and extensive cleaning of exam room while observing appropriate contact time as indicated for disinfecting solutions.

## 2020-04-06 ENCOUNTER — Encounter: Payer: Self-pay | Admitting: Internal Medicine

## 2020-04-06 DIAGNOSIS — I10 Essential (primary) hypertension: Secondary | ICD-10-CM | POA: Diagnosis not present

## 2020-04-10 ENCOUNTER — Ambulatory Visit (INDEPENDENT_AMBULATORY_CARE_PROVIDER_SITE_OTHER): Payer: Medicare PPO | Admitting: Internal Medicine

## 2020-04-10 ENCOUNTER — Other Ambulatory Visit: Payer: Self-pay

## 2020-04-10 VITALS — BP 158/70 | HR 74 | Temp 98.1°F | Resp 17 | Wt 117.6 lb

## 2020-04-10 DIAGNOSIS — M79676 Pain in unspecified toe(s): Secondary | ICD-10-CM

## 2020-04-10 DIAGNOSIS — B351 Tinea unguium: Secondary | ICD-10-CM | POA: Diagnosis not present

## 2020-04-12 ENCOUNTER — Encounter: Payer: Self-pay | Admitting: Internal Medicine

## 2020-04-12 NOTE — Patient Instructions (Signed)

## 2020-04-12 NOTE — Progress Notes (Signed)
Subjective:    Patient ID: Jacqueline Sanchez, female    DOB: [birth year], 86 y.o.   MRN: [id number]  HPI  Resident seen in [address] Requesting nail trim- nails thick, long getting caught on socks causing pain.  Review of Systems      Past Medical History:  Diagnosis Date  . Aphasia 04/2016   Transient---??TIA  . Asthma   . CKD (chronic kidney disease)   . Edema   . Femur fracture, left ([place]) 09/06/2016  . GERD (gastroesophageal reflux disease)   . Hyperlipidemia   . Hypertension   . Osteoporosis     Current Outpatient Medications  Medication Sig Dispense Refill  . acetaminophen (TYLENOL) 650 MG CR tablet Take 650 mg by mouth 3 (three) times daily. With percocet    . amLODipine (NORVASC) 10 MG tablet Take 1 tablet (10 mg total) by mouth daily. (Patient taking differently: Take 10 mg by mouth every morning. ) 30 tablet 0  . aspirin EC 81 MG tablet Take 81 mg by mouth daily.    . cetirizine (ZYRTEC) 10 MG tablet Take 10 mg by mouth at bedtime.    . ferrous sulfate 325 (65 FE) MG tablet Take 325 mg by mouth daily with breakfast.    . gabapentin (NEURONTIN) 100 MG capsule Take 1 capsule (100 mg total) by mouth 3 (three) times daily. 30 capsule 0  . hydrALAZINE (APRESOLINE) 25 MG tablet Take 1 tablet (25 mg total) by mouth 4 (four) times daily. (Patient taking differently: Take 25 mg by mouth 3 (three) times daily. ) 120 tablet 1  . lisinopril (ZESTRIL) 10 MG tablet Take 1 tablet (10 mg total) by mouth daily. 90 tablet 3  . loratadine (CLARITIN) 10 MG tablet Take 10 mg by mouth daily.    . Melatonin 3 MG TABS Take 1 tablet by mouth at bedtime.    . metoprolol succinate (TOPROL-XL) 50 MG 24 hr tablet Take 1 tablet (50 mg total) by mouth daily. Take with or immediately following a meal. 30 tablet 1  . Multiple Vitamin (MULTI-VITAMINS) TABS Take 1 tablet by mouth daily.    Marland Kitchen oxyCODONE (OXY IR/ROXICODONE) 5 MG immediate release tablet Take 5 mg by mouth 3 (three) times daily as needed  for severe pain.    . polyethylene glycol (MIRALAX / GLYCOLAX) packet Take 17 g by mouth daily.    Marland Kitchen senna (SENOKOT) 8.6 MG tablet Take 2 tablets by mouth 2 (two) times daily.     . vitamin B-12 (CYANOCOBALAMIN) 1000 MCG tablet Take 1,000 mcg by mouth daily.    . Vitamin D, Cholecalciferol, 1000 units CAPS Take 1 capsule by mouth daily.     No current facility-administered medications for this visit.    Allergies  Allergen Reactions  . Penicillins     Many many many years ago   . Morphine And Related Nausea Only    Family History  Problem Relation Age of Onset  . Hypertension Other     Social History   Socioeconomic History  . Marital status: Widowed    Spouse name: Not on file  . Number of children: 2  . Years of education: Not on file  . Highest education level: Not on file  Occupational History  . Occupation: [employer]    Comment: Retired  Tobacco Use  . Smoking status: Never Smoker  . Smokeless tobacco: Never Used  Substance and Sexual Activity  . Alcohol use: No  . Drug use: Not on file  .  Sexual activity: Never  Other Topics Concern  . Not on file  Social History Narrative   Widowed 2000   Has living will   Sons/DILs are health care POAs (mostly Mike/Jan)   Has DNR   No tube feeds if cognitively unaware   Social Determinants of Health   Financial Resource Strain:   . Difficulty of Paying Living Expenses: Not on file  Food Insecurity:   . Worried About Charity fundraiser in the Last Year: Not on file  . Ran Out of Food in the Last Year: Not on file  Transportation Needs:   . Lack of Transportation (Medical): Not on file  . Lack of Transportation (Non-Medical): Not on file  Physical Activity:   . Days of Exercise per Week: Not on file  . Minutes of Exercise per Session: Not on file  Stress:   . Feeling of Stress : Not on file  Social Connections:   . Frequency of Communication with Friends and Family: Not on file  . Frequency of Social  Gatherings with Friends and Family: Not on file  . Attends Religious Services: Not on file  . Active Member of Clubs or Organizations: Not on file  . Attends Archivist Meetings: Not on file  . Marital Status: Not on file  Intimate Partner Violence:   . Fear of Current or Ex-Partner: Not on file  . Emotionally Abused: Not on file  . Physically Abused: Not on file  . Sexually Abused: Not on file     Constitutional: Denies fever, malaise, fatigue, headache or abrupt weight changes.  Skin: Pt reports mycotic toenails. Denies redness, rashes, lesions or ulcercations.    No other specific complaints in a complete review of systems (except as listed in HPI above).  Objective:   Physical Exam   BP (!) 158/70   Pulse 74   Temp 98.1 F (36.7 C)   Resp 17   Wt 117 lb 9.6 oz (53.3 kg)   SpO2 98%   BMI 20.19 kg/m  Wt Readings from Last 3 Encounters:  04/12/20 117 lb 9.6 oz (53.3 kg)  02/05/20 118 lb 3.2 oz (53.6 kg)  01/30/20 114 lb 3.2 oz (51.8 kg)    General: Appears her stated age, well developed, well nourished in NAD. Skin: Mycotic toenails bilaterally Cardiovascular: Normal rate. Pulmonary/Chest: Normal effort.  BMET    Component Value Date/Time   NA 136 12/03/2016 0431   NA 140 05/30/2011 1101   K 4.3 12/03/2016 0431   K 4.3 05/30/2011 1101   CL 102 12/03/2016 0431   CL 99 05/30/2011 1101   CO2 28 12/03/2016 0431   CO2 30 05/30/2011 1101   GLUCOSE 108 (H) 12/03/2016 0431   GLUCOSE 101 (H) 05/30/2011 1101   BUN 18 12/03/2016 0431   BUN 20 (H) 05/30/2011 1101   CREATININE 1.3 (A) 10/09/2017 0000   CREATININE 1.05 (H) 12/03/2016 0431   CREATININE 1.69 (H) 11/18/2011 1535   CALCIUM 10.0 12/03/2016 0431   CALCIUM 11.0 (H) 05/30/2011 1101   GFRNONAA 44 (L) 12/03/2016 0431   GFRNONAA 26 (L) 11/18/2011 1535   GFRAA 51 (L) 12/03/2016 0431   GFRAA 31 (L) 11/18/2011 1535    Lipid Panel  No results found for: CHOL, TRIG, HDL, CHOLHDL, VLDL,  LDLCALC  CBC    Component Value Date/Time   WBC 11.3 (H) 12/03/2016 0431   RBC 3.11 (L) 12/03/2016 0431   HGB 10.1 (A) 10/09/2017 0000   HGB 12.7 05/30/2011  1101   HCT 30 (A) 10/09/2017 0000   HCT 37.8 05/30/2011 1101   PLT 266 12/03/2016 0431   PLT 237 05/30/2011 1101   MCV 97.9 12/03/2016 0431   MCV 101 (H) 05/30/2011 1101   MCH 32.9 12/03/2016 0431   MCHC 33.7 12/03/2016 0431   RDW 13.9 12/03/2016 0431   RDW 13.5 05/30/2011 1101   LYMPHSABS 0.7 (L) 09/06/2016 2338   MONOABS 1.0 (H) 09/06/2016 2338   EOSABS 0.0 09/06/2016 2338   BASOSABS 0.0 09/06/2016 2338    Hgb A1C No results found for: HGBA1C         Assessment & Plan:  Pain due to Mycotic Toenails:  Nails 1-5 b/l feet trimmed by provider using dremmel tool. Pt tolerated well, no complications.  Will reassess as needed Webb Silversmith, NP This visit occurred during the SARS-CoV-2 public health emergency.  Safety protocols were in place, including screening questions prior to the visit, additional usage of staff PPE, and extensive cleaning of exam room while observing appropriate contact time as indicated for disinfecting solutions.

## 2020-05-04 DIAGNOSIS — F0150 Vascular dementia without behavioral disturbance: Secondary | ICD-10-CM

## 2020-05-04 DIAGNOSIS — I10 Essential (primary) hypertension: Secondary | ICD-10-CM

## 2020-05-04 DIAGNOSIS — N1832 Chronic kidney disease, stage 3b: Secondary | ICD-10-CM

## 2020-05-04 DIAGNOSIS — M159 Polyosteoarthritis, unspecified: Secondary | ICD-10-CM

## 2020-05-04 DIAGNOSIS — F39 Unspecified mood [affective] disorder: Secondary | ICD-10-CM

## 2020-05-04 DIAGNOSIS — F1120 Opioid dependence, uncomplicated: Secondary | ICD-10-CM

## 2020-05-04 DIAGNOSIS — D692 Other nonthrombocytopenic purpura: Secondary | ICD-10-CM

## 2020-06-01 ENCOUNTER — Encounter: Payer: Self-pay | Admitting: Emergency Medicine

## 2020-06-01 ENCOUNTER — Emergency Department
Admission: EM | Admit: 2020-06-01 | Discharge: 2020-06-01 | Disposition: A | Discharge status: Home / Self Care | Payer: Medicare PPO | Attending: Emergency Medicine | Admitting: Emergency Medicine

## 2020-06-01 ENCOUNTER — Emergency Department: Payer: Medicare PPO

## 2020-06-01 ENCOUNTER — Other Ambulatory Visit: Payer: Self-pay

## 2020-06-01 DIAGNOSIS — J45909 Unspecified asthma, uncomplicated: Secondary | ICD-10-CM | POA: Diagnosis not present

## 2020-06-01 DIAGNOSIS — Z743 Need for continuous supervision: Secondary | ICD-10-CM | POA: Diagnosis not present

## 2020-06-01 DIAGNOSIS — I129 Hypertensive chronic kidney disease with stage 1 through stage 4 chronic kidney disease, or unspecified chronic kidney disease: Secondary | ICD-10-CM | POA: Diagnosis not present

## 2020-06-01 DIAGNOSIS — Z7982 Long term (current) use of aspirin: Secondary | ICD-10-CM | POA: Diagnosis not present

## 2020-06-01 DIAGNOSIS — W19XXXA Unspecified fall, initial encounter: Secondary | ICD-10-CM | POA: Diagnosis not present

## 2020-06-01 DIAGNOSIS — M9702XA Periprosthetic fracture around internal prosthetic left hip joint, initial encounter: Secondary | ICD-10-CM | POA: Insufficient documentation

## 2020-06-01 DIAGNOSIS — N183 Chronic kidney disease, stage 3 unspecified: Secondary | ICD-10-CM | POA: Insufficient documentation

## 2020-06-01 DIAGNOSIS — M25552 Pain in left hip: Secondary | ICD-10-CM | POA: Diagnosis not present

## 2020-06-01 DIAGNOSIS — Z96649 Presence of unspecified artificial hip joint: Secondary | ICD-10-CM

## 2020-06-01 DIAGNOSIS — Z96642 Presence of left artificial hip joint: Secondary | ICD-10-CM | POA: Insufficient documentation

## 2020-06-01 DIAGNOSIS — M978XXA Periprosthetic fracture around other internal prosthetic joint, initial encounter: Secondary | ICD-10-CM

## 2020-06-01 DIAGNOSIS — Z79899 Other long term (current) drug therapy: Secondary | ICD-10-CM | POA: Diagnosis not present

## 2020-06-01 DIAGNOSIS — S32302A Unspecified fracture of left ilium, initial encounter for closed fracture: Secondary | ICD-10-CM | POA: Diagnosis not present

## 2020-06-01 DIAGNOSIS — R279 Unspecified lack of coordination: Secondary | ICD-10-CM | POA: Diagnosis not present

## 2020-06-01 DIAGNOSIS — S32591A Other specified fracture of right pubis, initial encounter for closed fracture: Secondary | ICD-10-CM | POA: Diagnosis not present

## 2020-06-01 DIAGNOSIS — S32502A Unspecified fracture of left pubis, initial encounter for closed fracture: Secondary | ICD-10-CM | POA: Diagnosis not present

## 2020-06-01 DIAGNOSIS — I10 Essential (primary) hypertension: Secondary | ICD-10-CM | POA: Diagnosis not present

## 2020-06-01 DIAGNOSIS — S7222XA Displaced subtrochanteric fracture of left femur, initial encounter for closed fracture: Secondary | ICD-10-CM | POA: Diagnosis not present

## 2020-06-01 DIAGNOSIS — Z20822 Contact with and (suspected) exposure to covid-19: Secondary | ICD-10-CM | POA: Diagnosis not present

## 2020-06-01 DIAGNOSIS — S79912A Unspecified injury of left hip, initial encounter: Secondary | ICD-10-CM | POA: Diagnosis present

## 2020-06-01 DIAGNOSIS — S32592A Other specified fracture of left pubis, initial encounter for closed fracture: Secondary | ICD-10-CM | POA: Diagnosis not present

## 2020-06-01 DIAGNOSIS — R404 Transient alteration of awareness: Secondary | ICD-10-CM | POA: Diagnosis not present

## 2020-06-01 LAB — BASIC METABOLIC PANEL
Anion gap: 9 (ref 5–15)
BUN: 27 mg/dL — ABNORMAL HIGH (ref 8–23)
CO2: 21 mmol/L — ABNORMAL LOW (ref 22–32)
Calcium: 10 mg/dL (ref 8.9–10.3)
Chloride: 104 mmol/L (ref 98–111)
Creatinine, Ser: 1.32 mg/dL — ABNORMAL HIGH (ref 0.44–1.00)
GFR, Estimated: 36 mL/min — ABNORMAL LOW (ref >60)
Glucose, Bld: 110 mg/dL — ABNORMAL HIGH (ref 70–99)
Potassium: 5.3 mmol/L — ABNORMAL HIGH (ref 3.5–5.1)
Sodium: 134 mmol/L — ABNORMAL LOW (ref 135–145)

## 2020-06-01 LAB — POC SARS CORONAVIRUS 2 AG -  ED: SARS Coronavirus 2 Ag: NEGATIVE

## 2020-06-01 MED ORDER — ACETAMINOPHEN 500 MG PO TABS: 1000.0000 mg | ORAL_TABLET | Freq: Once | ORAL | Status: DC

## 2020-06-01 NOTE — ED Notes (Signed)
RN attempted to call [facility] for report, but no answer.

## 2020-06-01 NOTE — ED Notes (Signed)
Family at bedside. 

## 2020-06-01 NOTE — ED Triage Notes (Signed)
Pt presents via [facility] from [facility] with c/o known left pubic ramis fracture. Pt reportedly fell on Friday. Today staff at facility noticed outward rotation of left leg and performed x-rays which showed fracture. Pt able to move affected extremity and pulses 2+.

## 2020-06-01 NOTE — ED Notes (Signed)
Called [facility] to transport to [facility]

## 2020-06-01 NOTE — ED Provider Notes (Signed)
[facility] Emergency Department Provider Note ____________________________________________   Event Date/Time   First MD Initiated Contact with Patient 06/01/20 1150     (approximate)  I have reviewed the triage vital signs and the nursing notes.  HISTORY  Chief Complaint Fall   HPI Jacqueline Sanchez is a 87 y.o. femalewho presents to the ED for evaluation of left hip pain after a fall.   Chart review indicates hx remote partial left hip arthroplasty, HLD, GERD. DNR in place. Patient resides at a local SNF, [facility], and reportedly is ambulatory at her baseline with a walker.  Patient reportedly had a mechanical fall that occurred this past Friday, 3 days ago.  Due to her inability to ambulate and reliance upon a wheelchair over the weekend, imaging of her pelvis and left hip were obtained at her facility, the report is provided with her information to [place] and is read as having a minimally displaced left-sided pubic rami fracture, inferior and superior.  Here in the ED, patient reports left hip pain as her only complaint.  She reports that her pain is "okay" if she is lying still, but reports up to 8/10 intensity pain with transfer and moving in bed.  Denies headache, back pain or other extremity pain.  She reports that she does recall the event, and reports a mechanical fall where she tripped and fell.  Denies any associated syncope, chest pain, palpitations or headache.   Past Medical History:  Diagnosis Date  . Aphasia 04/2016   Transient---??TIA  . Asthma   . CKD (chronic kidney disease)   . Edema   . Femur fracture, left ([place]) 09/06/2016  . GERD (gastroesophageal reflux disease)   . Hyperlipidemia   . Hypertension   . Osteoporosis     Patient Active Problem List   Diagnosis Date Noted  . Mood disorder ([place]) 01/30/2020  . Aphasia as late effect of stroke 04/07/2019  . Iron deficiency anemia 04/07/2019  . Allergic rhinitis 10/12/2017  . GERD  (gastroesophageal reflux disease) 07/28/2017  . Osteoporosis 07/28/2017  . Narcotic dependence ([place]) 04/20/2017  . Valvular heart disease 04/20/2017  . HLD (hyperlipidemia) 10/12/2016  . Essential hypertension, benign 06/23/2016  . Generalized osteoarthritis of multiple sites 06/23/2016  . Chronic renal disease, stage III ([place]) 04/21/2016    Past Surgical History:  Procedure Laterality Date  . ANTERIOR HIP REVISION Left 12/01/2016   Procedure: ANTERIOR HIP REVISION, REVISION TO TOTAL;  Surgeon: Hessie Knows, MD;  Location: [facility] ORS;  Service: Orthopedics;  Laterality: Left;  . HIP ARTHROPLASTY Left 09/07/2016   Procedure: ARTHROPLASTY BIPOLAR HIP (HEMIARTHROPLASTY);  Surgeon: Thornton Park, MD;  Location: [facility] ORS;  Service: Orthopedics;  Laterality: Left;  . HIP CLOSED REDUCTION Left 11/03/2016   Procedure: CLOSED REDUCTION HIP;  Surgeon: Thornton Park, MD;  Location: [facility] ORS;  Service: Orthopedics;  Laterality: Left;  . HIP CLOSED REDUCTION Left 11/24/2016   Procedure: CLOSED REDUCTION HIP;  Surgeon: Thornton Park, MD;  Location: [facility] ORS;  Service: Orthopedics;  Laterality: Left;  . KYPHOPLASTY      Prior to Admission medications   Medication Sig Start Date End Date Taking? Authorizing Provider  acetaminophen (TYLENOL) 650 MG CR tablet Take 650 mg by mouth 3 (three) times daily. With percocet   Yes [provider]  amLODipine (NORVASC) 10 MG tablet Take 1 tablet (10 mg total) by mouth daily. Patient taking differently: Take 10 mg by mouth every morning. 04/23/16  Yes Fritzi Mandes, MD  aspirin EC  81 MG tablet Take 81 mg by mouth daily.   Yes [provider]  cetirizine (ZYRTEC) 10 MG tablet Take 10 mg by mouth at bedtime.   Yes [provider]  cholecalciferol (VITAMIN D) 25 MCG (1000 UNIT) tablet Take 1,000 Units by mouth daily. 03/16/20  Yes [provider]  ferrous sulfate 325 (65 FE) MG tablet Take 325 mg by mouth daily with breakfast.   Yes  [provider]  hydrALAZINE (APRESOLINE) 25 MG tablet Take 1 tablet (25 mg total) by mouth 4 (four) times daily. Patient taking differently: Take 25 mg by mouth 3 (three) times daily. 04/23/16  Yes Fritzi Mandes, MD  lisinopril (ZESTRIL) 10 MG tablet Take 1 tablet (10 mg total) by mouth daily. 10/17/19  Yes Jearld Fenton, NP  loratadine (CLARITIN) 10 MG tablet Take 10 mg by mouth daily.   Yes [provider]  Melatonin 3 MG TABS Take 1 tablet by mouth at bedtime.   Yes [provider]  metoprolol succinate (TOPROL-XL) 50 MG 24 hr tablet Take 1 tablet (50 mg total) by mouth daily. Take with or immediately following a meal. 04/23/16  Yes Fritzi Mandes, MD  Multiple Vitamin (MULTI-VITAMINS) TABS Take 1 tablet by mouth daily.   Yes [provider]  senna (SENOKOT) 8.6 MG tablet Take 2 tablets by mouth 2 (two) times daily.    Yes [provider]  vitamin B-12 (CYANOCOBALAMIN) 1000 MCG tablet Take 1,000 mcg by mouth daily.   Yes [provider]  Vitamin D, Cholecalciferol, 1000 units CAPS Take 1 capsule by mouth daily.   Yes [provider]  SENNA-PLUS 8.6-50 MG tablet Take 2 tablets by mouth 2 (two) times daily. Patient not taking: No sig reported 02/16/20   [provider]    Allergies Penicillins and Morphine and related  Family History  Problem Relation Age of Onset  . Hypertension Other     Social History Social History   Tobacco Use  . Smoking status: Never Smoker  . Smokeless tobacco: Never Used  Substance Use Topics  . Alcohol use: No    Review of Systems  Constitutional: No fever/chills Eyes: No visual changes. ENT: No sore throat. Cardiovascular: Denies chest pain. Respiratory: Denies shortness of breath. Gastrointestinal: No abdominal pain.  No nausea, no vomiting.  No diarrhea.  No constipation. Genitourinary: Negative for dysuria. Musculoskeletal: Negative for back pain.  Positive for left hip  pain. Skin: Negative for rash. Neurological: Negative for headaches, focal weakness or numbness.  ____________________________________________   PHYSICAL EXAM:  VITAL SIGNS: Vitals:   06/01/20 1202  BP: (!) 173/57  Pulse: 93  Resp: 20  Temp: 98 F (36.7 C)  SpO2: 96%    Constitutional: Alert and oriented. Well appearing and in no acute distress. Eyes: Conjunctivae are normal. PERRL. EOMI. Head: Atraumatic. Nose: No congestion/rhinnorhea. Mouth/Throat: Mucous membranes are moist.  Oropharynx non-erythematous. Neck: No stridor. No cervical spine tenderness to palpation. Cardiovascular: Normal rate, regular rhythm. Grossly normal heart sounds.  Good peripheral circulation. Respiratory: Normal respiratory effort.  No retractions. Lungs CTAB. Gastrointestinal: Soft , nondistended, nontender to palpation. No CVA tenderness. Musculoskeletal: Tenderness over the left hip without evidence of open injury.  LLE is distally neurovascularly intact. Palpation of all four extremities otherwise without evidence of deformity, tenderness or acute traumatic pathology No midline spinal tenderness or signs of trauma to the back. Neurologic:  Normal speech and language. No gross focal neurologic deficits are appreciated. Skin:  Skin is warm, dry  and intact. No rash noted. Psychiatric: Mood and affect are normal. Speech and behavior are normal. ____________________________________________   LABS (all labs ordered are listed, but only abnormal results are displayed)  Labs Reviewed  BASIC METABOLIC PANEL - Abnormal; Notable for the following components:      Result Value   Sodium 134 (*)    Potassium 5.3 (*)    CO2 21 (*)    Glucose, Bld 110 (*)    BUN 27 (*)    Creatinine, Ser 1.32 (*)    GFR, Estimated 36 (*)    All other components within normal limits  CBC WITH DIFFERENTIAL/PLATELET  URINALYSIS, COMPLETE (UACMP) WITH MICROSCOPIC  CBC WITH DIFFERENTIAL/PLATELET  POC SARS CORONAVIRUS 2  AG -  ED   ____________________________________________  12 Lead EKG  Sinus rhythm, rate of 87 bpm.  Normal axis and intervals.  No evidence of acute ischemia. ____________________________________________  RADIOLOGY  ED MD interpretation: Plain film of the pelvis and left hip reviewed by me with evidence of pubic rami fractures inferiorly and superiorly. CT of the pelvis reviewed by me with evidence of pelvic fractures and periprosthetic trochanteric femur fracture.  Official radiology report(s): CT PELVIS WO CONTRAST  Result Date: 06/01/2020 CLINICAL DATA:  Evaluate left pelvic fractures EXAM: CT PELVIS WITHOUT CONTRAST TECHNIQUE: Multidetector CT imaging of the pelvis was performed following the standard protocol without intravenous contrast. COMPARISON:  X-ray 06/01/2020 FINDINGS: Musculoskeletal: Acute mildly displaced fracture of the left pubic root (series 6, images 35-38). Suspect fracture involvement of the anterior acetabulum although extensive metallic streak artifact from hardware degrades evaluation. Mildly displaced fracture through the midportion of the left inferior pubic ramus (series 2, image 106). Prior left total hip arthroplasty with proximal cerclage wire. Acute periprosthetic fracture of the subtrochanteric aspect of the proximal left femur (series 7, image 143; series 2, image 120) with mild anterior displacement of the fracture fragment. There may be a nondisplaced component involving the greater trochanter although excessive susceptibility artifact degrades bony detail. Hardware remains well seated without dislocation. Suspect subacute nondisplaced right sacral alar fracture (series 2, image 25; series 6, image 57). Chronic healed right superior and inferior pubic rami fractures with robust bony callus formation. Remote left anterior inferior iliac spine avulsion fracture with well corticated margins. Prior cement augmentation within the L5 vertebral body. Advanced  arthropathy of the pubic symphysis. There is soft tissue induration overlying the lateral aspect of the left hip without focal hematoma. Urinary Tract: Right renal sinus vascular calcification versus nonobstructing renal stone. Otherwise, no abnormality visualized. Bowel:  Unremarkable visualized pelvic bowel loops. Vascular/Lymphatic: Extensive aortoiliac atherosclerotic calcification. No lymphadenopathy. Reproductive:  Prior hysterectomy. Other:  No free fluid within the abdomen or pelvis. IMPRESSION: 1. Acute periprosthetic fracture of the subtrochanteric aspect of the proximal left femur with mild anterior displacement of the fracture fragment. 2. Acute mildly displaced fracture of the left pubic root. Suspect fracture involvement of the anterior acetabulum although extensive metallic streak artifact from hardware degrades evaluation. Mildly displaced fracture through the midportion of the left inferior pubic ramus. 3. Suspect subacute nondisplaced right sacral alar fracture. 4. Chronic healed right superior and inferior pubic rami fractures. Remote left anterior inferior iliac spine avulsion fracture. 5. Aortic atherosclerosis. (ICD10-I70.0). Electronically Signed   By: Davina Poke D.O.   On: 06/01/2020 13:54   DG Hip Unilat W or Wo Pelvis 2-3 Views Left  Result Date: 06/01/2020 CLINICAL DATA:  Left hip pain after fall. EXAM: DG HIP (WITH OR WITHOUT PELVIS) 2-3V  LEFT COMPARISON:  12/01/2016 left hip radiographs FINDINGS: Left total hip arthroplasty with no evidence of hardware fracture or loosening. Minimally displaced fracture of the lateral aspect of the left pubic ramus involving the anterior column of the left acetabulum. Nondisplaced left inferior pubic ramus fracture. Healed deformities of the superior and inferior right pubic rami. No focal osseous lesions. Vertebroplasty material overlies the lower lumbar spine. IMPRESSION: 1. Minimally displaced fracture of the lateral aspect of the left pubic  ramus involving the anterior column of the left acetabulum. 2. Nondisplaced left inferior pubic ramus fracture. 3. Otherwise no evidence of complication involving left total hip arthroplasty. Electronically Signed   By: Ilona Sorrel M.D.   On: 06/01/2020 12:33    ____________________________________________   PROCEDURES and INTERVENTIONS  Procedure(s) performed (including Critical Care):  .1-3 Lead EKG Interpretation Performed by: Vladimir Crofts, MD Authorized by: Vladimir Crofts, MD     Interpretation: normal     ECG rate:  90   ECG rate assessment: normal     Rhythm: sinus rhythm     Ectopy: none     Conduction: normal      Medications  acetaminophen (TYLENOL) tablet 1,000 mg (1,000 mg Oral Not Given 06/01/20 1233)    ____________________________________________   MDM / ED COURSE   87 year old woman who is typically ambulatory with a walker presents to the ED with left hip pain after mechanical fall that occurred 3 days ago, with evidence of multiple pelvic fractures as well as a periprosthetic femur fracture, with nonoperative recommendations of orthopedics and should be able to return to her facility.  Slightly hypertensive, vitals otherwise normal on room air.  Exam without evidence of neurovascular deficits or trauma beyond the pain to her left hip.  No evidence of open injury.  Plain films reviewed with evidence of pubic rami fracture.  Her pain seems out of proportion to this and her inability to bear weight was concerning, so follow-up CT without contrast was obtained and demonstrates periprosthetic fracture as well.  Discussed the case with orthopedics who recommends nonoperative management with toe touch weightbearing status.  Patient is currently in a long-term care section of [facility], and I suspect that she would be able to return to them.  Consulted social work/case management to help facilitate this to see if we needed PT consult or anything else prior to return to  facility.   Clinical Course as of 06/01/20 1458  Mon Jun 01, 2020  1406 I speak with Dr. Mack Guise about multiple fractures, including periprosthetic fractures, seen on CT of her pelvis.  He will review these images and call me back. [DS]  [id number] from [place], has reviewed CT images. Believes this is nonoperative. Nonweight bearing . F/u with Rudene Christians or Krasinski in 1-2 weeks.  [DS]  6546 Patient son at the bedside and I provide him with an update regarding fractures, nonoperative recommendations with orthopedics, and hopeful return to [facility] facility later today.  Awaiting response from case manager/social work regarding return to facility. [DS]    Clinical Course User Index [DS] Vladimir Crofts, MD    ____________________________________________   FINAL CLINICAL IMPRESSION(S) / ED DIAGNOSES  Final diagnoses:  Closed fracture of ramus of left pubis, initial encounter ([place])  Periprosthetic fracture around internal prosthetic hip joint, initial encounter  Fall, initial encounter     ED Discharge Orders    None       Darriel Utter   Note:  This document was prepared using Dragon voice  recognition software and may include unintentional dictation errors.   Vladimir Crofts, MD 06/01/20 1501

## 2020-06-01 NOTE — ED Notes (Signed)
Pt in xray

## 2020-06-01 NOTE — Clinical Social Work Note (Signed)
Transition of Care [facility]) - Emergency Department Mini Assessment   Patient Details  Name: Jacqueline Sanchez MRN: [id number] Date of Birth: Nov 16, 1919  [facility] CM/SW Contact:    Iona Beard, [place] Phone Number: 06/01/2020, 6:14 PM   Clinical Narrative: TOC received consult due to pt needing rehab at [facility]. CSW spoke with pts son Ronalee Belts who would like for pt to return to [facility]. CSW spoke to [place] with admissions at [facility] about options for pt to return. Per Seth Bake the two options are as follows. Pt will need to be seen by PT/OT and insurance auth will have to be started before pt can return for Med A therapy or pt can return as soon as she is medically cleared and receive Med B therapy with less frequency. CSW informed pts son Ronalee Belts and Attending of the options. They were to speak and make decision.   CSW informed that family has decided on sending pt back to [facility] for Med B therapy. Transport has been set up and RN is calling report. CSW updated Seth Bake with admissions who would like d/c instructions faxed over. CSW to fax. TOC signing off.   ED Mini Assessment: What brought you to the Emergency Department? : Fall  Barriers to Discharge: Barriers Resolved  Barrier interventions: Spoke with family and admissions at [facility] of departure: Ambulance  Interventions which prevented an admission or readmission: Other (must enter comment) (Return to [facility] for therapy)    Patient Contact and Communications Key Contact 1: Seth Bake [facility] admissions) Key Contact 2: Ayshia Gramlich   Contact Date: 06/01/20,       Call outcome: Pt to return to [facility]  Patient states their goals for this hospitalization and ongoing recovery are:: Return to [facility]   Choice offered to / list presented to : NA  Admission diagnosis:  Lt side, possible hip fracture, EMS Patient Active Problem List   Diagnosis Date Noted  . Mood disorder ([place]) 01/30/2020   . Aphasia as late effect of stroke 04/07/2019  . Iron deficiency anemia 04/07/2019  . Allergic rhinitis 10/12/2017  . GERD (gastroesophageal reflux disease) 07/28/2017  . Osteoporosis 07/28/2017  . Narcotic dependence ([place]) 04/20/2017  . Valvular heart disease 04/20/2017  . HLD (hyperlipidemia) 10/12/2016  . Essential hypertension, benign 06/23/2016  . Generalized osteoarthritis of multiple sites 06/23/2016  . Chronic renal disease, stage III ([place]) 04/21/2016   PCP:  Venia Carbon, MD Pharmacy:   [id number] [address], [place] [address] Phone: [phone] Fax: [phone]  [id number] - [place], [place] [address] [place] [address] Phone: [phone] Fax: [place], [place] [address] Phone: [phone] Fax: [phone]

## 2020-06-01 NOTE — ED Notes (Addendum)
Report given to EMS. Pt unable to sign discharge paperwork. Discharge paperwork with EMS to give to facility

## 2020-06-01 NOTE — ED Notes (Signed)
Family at bedside. Pt resting in bed stating no pain. Pt states she fell several days ago

## 2020-06-01 NOTE — Discharge Instructions (Addendum)
Please seek medical attention for any high fevers, chest pain, shortness of breath, change in behavior, persistent vomiting, bloody stool or any other new or concerning symptoms.  

## 2020-06-02 DIAGNOSIS — F39 Unspecified mood [affective] disorder: Secondary | ICD-10-CM | POA: Diagnosis not present

## 2020-06-02 DIAGNOSIS — N1832 Chronic kidney disease, stage 3b: Secondary | ICD-10-CM | POA: Diagnosis not present

## 2020-06-02 DIAGNOSIS — S32509S Unspecified fracture of unspecified pubis, sequela: Secondary | ICD-10-CM | POA: Diagnosis not present

## 2020-06-02 DIAGNOSIS — S7292XA Unspecified fracture of left femur, initial encounter for closed fracture: Secondary | ICD-10-CM | POA: Diagnosis not present

## 2020-06-02 DIAGNOSIS — I10 Essential (primary) hypertension: Secondary | ICD-10-CM | POA: Diagnosis not present

## 2020-06-02 DIAGNOSIS — F1120 Opioid dependence, uncomplicated: Secondary | ICD-10-CM | POA: Diagnosis not present

## 2020-06-02 DIAGNOSIS — F0150 Vascular dementia without behavioral disturbance: Secondary | ICD-10-CM | POA: Diagnosis not present

## 2020-06-12 DIAGNOSIS — S32502D Unspecified fracture of left pubis, subsequent encounter for fracture with routine healing: Secondary | ICD-10-CM | POA: Diagnosis not present

## 2020-06-12 DIAGNOSIS — F0150 Vascular dementia without behavioral disturbance: Secondary | ICD-10-CM | POA: Diagnosis not present

## 2020-06-12 DIAGNOSIS — R278 Other lack of coordination: Secondary | ICD-10-CM | POA: Diagnosis not present

## 2020-06-12 DIAGNOSIS — M810 Age-related osteoporosis without current pathological fracture: Secondary | ICD-10-CM | POA: Diagnosis not present

## 2020-06-12 DIAGNOSIS — I129 Hypertensive chronic kidney disease with stage 1 through stage 4 chronic kidney disease, or unspecified chronic kidney disease: Secondary | ICD-10-CM | POA: Diagnosis not present

## 2020-06-12 DIAGNOSIS — R2689 Other abnormalities of gait and mobility: Secondary | ICD-10-CM | POA: Diagnosis not present

## 2020-06-15 DIAGNOSIS — M810 Age-related osteoporosis without current pathological fracture: Secondary | ICD-10-CM | POA: Diagnosis not present

## 2020-06-15 DIAGNOSIS — S32502D Unspecified fracture of left pubis, subsequent encounter for fracture with routine healing: Secondary | ICD-10-CM | POA: Diagnosis not present

## 2020-06-15 DIAGNOSIS — R278 Other lack of coordination: Secondary | ICD-10-CM | POA: Diagnosis not present

## 2020-06-15 DIAGNOSIS — R2689 Other abnormalities of gait and mobility: Secondary | ICD-10-CM | POA: Diagnosis not present

## 2020-06-15 DIAGNOSIS — M9702XA Periprosthetic fracture around internal prosthetic left hip joint, initial encounter: Secondary | ICD-10-CM | POA: Diagnosis not present

## 2020-06-15 DIAGNOSIS — F0150 Vascular dementia without behavioral disturbance: Secondary | ICD-10-CM | POA: Diagnosis not present

## 2020-06-15 DIAGNOSIS — I129 Hypertensive chronic kidney disease with stage 1 through stage 4 chronic kidney disease, or unspecified chronic kidney disease: Secondary | ICD-10-CM | POA: Diagnosis not present

## 2020-06-17 DIAGNOSIS — S32502D Unspecified fracture of left pubis, subsequent encounter for fracture with routine healing: Secondary | ICD-10-CM | POA: Diagnosis not present

## 2020-06-17 DIAGNOSIS — R2689 Other abnormalities of gait and mobility: Secondary | ICD-10-CM | POA: Diagnosis not present

## 2020-06-17 DIAGNOSIS — M810 Age-related osteoporosis without current pathological fracture: Secondary | ICD-10-CM | POA: Diagnosis not present

## 2020-06-17 DIAGNOSIS — F0150 Vascular dementia without behavioral disturbance: Secondary | ICD-10-CM | POA: Diagnosis not present

## 2020-06-17 DIAGNOSIS — I129 Hypertensive chronic kidney disease with stage 1 through stage 4 chronic kidney disease, or unspecified chronic kidney disease: Secondary | ICD-10-CM | POA: Diagnosis not present

## 2020-06-17 DIAGNOSIS — R278 Other lack of coordination: Secondary | ICD-10-CM | POA: Diagnosis not present

## 2020-06-18 DIAGNOSIS — F0150 Vascular dementia without behavioral disturbance: Secondary | ICD-10-CM | POA: Diagnosis not present

## 2020-06-18 DIAGNOSIS — I10 Essential (primary) hypertension: Secondary | ICD-10-CM | POA: Diagnosis not present

## 2020-06-18 DIAGNOSIS — S32502D Unspecified fracture of left pubis, subsequent encounter for fracture with routine healing: Secondary | ICD-10-CM | POA: Diagnosis not present

## 2020-06-18 DIAGNOSIS — D509 Iron deficiency anemia, unspecified: Secondary | ICD-10-CM | POA: Diagnosis not present

## 2020-06-18 DIAGNOSIS — I129 Hypertensive chronic kidney disease with stage 1 through stage 4 chronic kidney disease, or unspecified chronic kidney disease: Secondary | ICD-10-CM | POA: Diagnosis not present

## 2020-06-18 DIAGNOSIS — D539 Nutritional anemia, unspecified: Secondary | ICD-10-CM | POA: Diagnosis not present

## 2020-06-18 DIAGNOSIS — R2689 Other abnormalities of gait and mobility: Secondary | ICD-10-CM | POA: Diagnosis not present

## 2020-06-18 DIAGNOSIS — M810 Age-related osteoporosis without current pathological fracture: Secondary | ICD-10-CM | POA: Diagnosis not present

## 2020-06-18 DIAGNOSIS — R278 Other lack of coordination: Secondary | ICD-10-CM | POA: Diagnosis not present

## 2020-06-19 DIAGNOSIS — B351 Tinea unguium: Secondary | ICD-10-CM | POA: Diagnosis not present

## 2020-06-22 DIAGNOSIS — M810 Age-related osteoporosis without current pathological fracture: Secondary | ICD-10-CM | POA: Diagnosis not present

## 2020-06-22 DIAGNOSIS — S32502D Unspecified fracture of left pubis, subsequent encounter for fracture with routine healing: Secondary | ICD-10-CM | POA: Diagnosis not present

## 2020-06-22 DIAGNOSIS — R2689 Other abnormalities of gait and mobility: Secondary | ICD-10-CM | POA: Diagnosis not present

## 2020-06-22 DIAGNOSIS — R278 Other lack of coordination: Secondary | ICD-10-CM | POA: Diagnosis not present

## 2020-06-22 DIAGNOSIS — F0150 Vascular dementia without behavioral disturbance: Secondary | ICD-10-CM | POA: Diagnosis not present

## 2020-06-22 DIAGNOSIS — I129 Hypertensive chronic kidney disease with stage 1 through stage 4 chronic kidney disease, or unspecified chronic kidney disease: Secondary | ICD-10-CM | POA: Diagnosis not present

## 2020-06-23 DIAGNOSIS — M810 Age-related osteoporosis without current pathological fracture: Secondary | ICD-10-CM | POA: Diagnosis not present

## 2020-06-23 DIAGNOSIS — R2689 Other abnormalities of gait and mobility: Secondary | ICD-10-CM | POA: Diagnosis not present

## 2020-06-23 DIAGNOSIS — R278 Other lack of coordination: Secondary | ICD-10-CM | POA: Diagnosis not present

## 2020-06-23 DIAGNOSIS — I129 Hypertensive chronic kidney disease with stage 1 through stage 4 chronic kidney disease, or unspecified chronic kidney disease: Secondary | ICD-10-CM | POA: Diagnosis not present

## 2020-06-23 DIAGNOSIS — S32502D Unspecified fracture of left pubis, subsequent encounter for fracture with routine healing: Secondary | ICD-10-CM | POA: Diagnosis not present

## 2020-06-23 DIAGNOSIS — F0150 Vascular dementia without behavioral disturbance: Secondary | ICD-10-CM | POA: Diagnosis not present

## 2020-06-24 DIAGNOSIS — S32502D Unspecified fracture of left pubis, subsequent encounter for fracture with routine healing: Secondary | ICD-10-CM | POA: Diagnosis not present

## 2020-06-24 DIAGNOSIS — R278 Other lack of coordination: Secondary | ICD-10-CM | POA: Diagnosis not present

## 2020-06-24 DIAGNOSIS — R2689 Other abnormalities of gait and mobility: Secondary | ICD-10-CM | POA: Diagnosis not present

## 2020-06-24 DIAGNOSIS — I129 Hypertensive chronic kidney disease with stage 1 through stage 4 chronic kidney disease, or unspecified chronic kidney disease: Secondary | ICD-10-CM | POA: Diagnosis not present

## 2020-06-24 DIAGNOSIS — M810 Age-related osteoporosis without current pathological fracture: Secondary | ICD-10-CM | POA: Diagnosis not present

## 2020-06-24 DIAGNOSIS — F0150 Vascular dementia without behavioral disturbance: Secondary | ICD-10-CM | POA: Diagnosis not present

## 2020-06-25 DIAGNOSIS — R2689 Other abnormalities of gait and mobility: Secondary | ICD-10-CM | POA: Diagnosis not present

## 2020-06-25 DIAGNOSIS — M810 Age-related osteoporosis without current pathological fracture: Secondary | ICD-10-CM | POA: Diagnosis not present

## 2020-06-25 DIAGNOSIS — S32502D Unspecified fracture of left pubis, subsequent encounter for fracture with routine healing: Secondary | ICD-10-CM | POA: Diagnosis not present

## 2020-06-25 DIAGNOSIS — R278 Other lack of coordination: Secondary | ICD-10-CM | POA: Diagnosis not present

## 2020-06-25 DIAGNOSIS — F0150 Vascular dementia without behavioral disturbance: Secondary | ICD-10-CM | POA: Diagnosis not present

## 2020-06-25 DIAGNOSIS — I129 Hypertensive chronic kidney disease with stage 1 through stage 4 chronic kidney disease, or unspecified chronic kidney disease: Secondary | ICD-10-CM | POA: Diagnosis not present

## 2020-06-26 DIAGNOSIS — M810 Age-related osteoporosis without current pathological fracture: Secondary | ICD-10-CM | POA: Diagnosis not present

## 2020-06-26 DIAGNOSIS — S32502D Unspecified fracture of left pubis, subsequent encounter for fracture with routine healing: Secondary | ICD-10-CM | POA: Diagnosis not present

## 2020-06-26 DIAGNOSIS — R278 Other lack of coordination: Secondary | ICD-10-CM | POA: Diagnosis not present

## 2020-06-26 DIAGNOSIS — F0150 Vascular dementia without behavioral disturbance: Secondary | ICD-10-CM | POA: Diagnosis not present

## 2020-06-26 DIAGNOSIS — I129 Hypertensive chronic kidney disease with stage 1 through stage 4 chronic kidney disease, or unspecified chronic kidney disease: Secondary | ICD-10-CM | POA: Diagnosis not present

## 2020-06-26 DIAGNOSIS — R2689 Other abnormalities of gait and mobility: Secondary | ICD-10-CM | POA: Diagnosis not present

## 2020-06-29 DIAGNOSIS — M810 Age-related osteoporosis without current pathological fracture: Secondary | ICD-10-CM | POA: Diagnosis not present

## 2020-06-29 DIAGNOSIS — R278 Other lack of coordination: Secondary | ICD-10-CM | POA: Diagnosis not present

## 2020-06-29 DIAGNOSIS — S32502D Unspecified fracture of left pubis, subsequent encounter for fracture with routine healing: Secondary | ICD-10-CM | POA: Diagnosis not present

## 2020-06-29 DIAGNOSIS — F0150 Vascular dementia without behavioral disturbance: Secondary | ICD-10-CM | POA: Diagnosis not present

## 2020-06-29 DIAGNOSIS — I129 Hypertensive chronic kidney disease with stage 1 through stage 4 chronic kidney disease, or unspecified chronic kidney disease: Secondary | ICD-10-CM | POA: Diagnosis not present

## 2020-06-29 DIAGNOSIS — R2689 Other abnormalities of gait and mobility: Secondary | ICD-10-CM | POA: Diagnosis not present

## 2020-06-30 DIAGNOSIS — I129 Hypertensive chronic kidney disease with stage 1 through stage 4 chronic kidney disease, or unspecified chronic kidney disease: Secondary | ICD-10-CM | POA: Diagnosis not present

## 2020-06-30 DIAGNOSIS — S32502D Unspecified fracture of left pubis, subsequent encounter for fracture with routine healing: Secondary | ICD-10-CM | POA: Diagnosis not present

## 2020-06-30 DIAGNOSIS — F0150 Vascular dementia without behavioral disturbance: Secondary | ICD-10-CM | POA: Diagnosis not present

## 2020-06-30 DIAGNOSIS — R2689 Other abnormalities of gait and mobility: Secondary | ICD-10-CM | POA: Diagnosis not present

## 2020-06-30 DIAGNOSIS — M810 Age-related osteoporosis without current pathological fracture: Secondary | ICD-10-CM | POA: Diagnosis not present

## 2020-06-30 DIAGNOSIS — R278 Other lack of coordination: Secondary | ICD-10-CM | POA: Diagnosis not present

## 2020-07-01 DIAGNOSIS — R2689 Other abnormalities of gait and mobility: Secondary | ICD-10-CM | POA: Diagnosis not present

## 2020-07-01 DIAGNOSIS — F0150 Vascular dementia without behavioral disturbance: Secondary | ICD-10-CM | POA: Diagnosis not present

## 2020-07-01 DIAGNOSIS — M810 Age-related osteoporosis without current pathological fracture: Secondary | ICD-10-CM | POA: Diagnosis not present

## 2020-07-01 DIAGNOSIS — R278 Other lack of coordination: Secondary | ICD-10-CM | POA: Diagnosis not present

## 2020-07-01 DIAGNOSIS — I129 Hypertensive chronic kidney disease with stage 1 through stage 4 chronic kidney disease, or unspecified chronic kidney disease: Secondary | ICD-10-CM | POA: Diagnosis not present

## 2020-07-01 DIAGNOSIS — S32502D Unspecified fracture of left pubis, subsequent encounter for fracture with routine healing: Secondary | ICD-10-CM | POA: Diagnosis not present

## 2020-07-02 DIAGNOSIS — F0150 Vascular dementia without behavioral disturbance: Secondary | ICD-10-CM | POA: Diagnosis not present

## 2020-07-02 DIAGNOSIS — S32502D Unspecified fracture of left pubis, subsequent encounter for fracture with routine healing: Secondary | ICD-10-CM | POA: Diagnosis not present

## 2020-07-02 DIAGNOSIS — R278 Other lack of coordination: Secondary | ICD-10-CM | POA: Diagnosis not present

## 2020-07-02 DIAGNOSIS — E039 Hypothyroidism, unspecified: Secondary | ICD-10-CM | POA: Diagnosis not present

## 2020-07-02 DIAGNOSIS — M810 Age-related osteoporosis without current pathological fracture: Secondary | ICD-10-CM | POA: Diagnosis not present

## 2020-07-02 DIAGNOSIS — E785 Hyperlipidemia, unspecified: Secondary | ICD-10-CM | POA: Diagnosis not present

## 2020-07-02 DIAGNOSIS — E089 Diabetes mellitus due to underlying condition without complications: Secondary | ICD-10-CM | POA: Diagnosis not present

## 2020-07-02 DIAGNOSIS — R2689 Other abnormalities of gait and mobility: Secondary | ICD-10-CM | POA: Diagnosis not present

## 2020-07-02 DIAGNOSIS — I129 Hypertensive chronic kidney disease with stage 1 through stage 4 chronic kidney disease, or unspecified chronic kidney disease: Secondary | ICD-10-CM | POA: Diagnosis not present

## 2020-07-04 DIAGNOSIS — R2689 Other abnormalities of gait and mobility: Secondary | ICD-10-CM | POA: Diagnosis not present

## 2020-07-04 DIAGNOSIS — R278 Other lack of coordination: Secondary | ICD-10-CM | POA: Diagnosis not present

## 2020-07-04 DIAGNOSIS — I129 Hypertensive chronic kidney disease with stage 1 through stage 4 chronic kidney disease, or unspecified chronic kidney disease: Secondary | ICD-10-CM | POA: Diagnosis not present

## 2020-07-04 DIAGNOSIS — M810 Age-related osteoporosis without current pathological fracture: Secondary | ICD-10-CM | POA: Diagnosis not present

## 2020-07-04 DIAGNOSIS — F0150 Vascular dementia without behavioral disturbance: Secondary | ICD-10-CM | POA: Diagnosis not present

## 2020-07-04 DIAGNOSIS — S32502D Unspecified fracture of left pubis, subsequent encounter for fracture with routine healing: Secondary | ICD-10-CM | POA: Diagnosis not present

## 2020-07-05 DIAGNOSIS — R278 Other lack of coordination: Secondary | ICD-10-CM | POA: Diagnosis not present

## 2020-07-05 DIAGNOSIS — S32502D Unspecified fracture of left pubis, subsequent encounter for fracture with routine healing: Secondary | ICD-10-CM | POA: Diagnosis not present

## 2020-07-05 DIAGNOSIS — R2689 Other abnormalities of gait and mobility: Secondary | ICD-10-CM | POA: Diagnosis not present

## 2020-07-05 DIAGNOSIS — I129 Hypertensive chronic kidney disease with stage 1 through stage 4 chronic kidney disease, or unspecified chronic kidney disease: Secondary | ICD-10-CM | POA: Diagnosis not present

## 2020-07-05 DIAGNOSIS — F0150 Vascular dementia without behavioral disturbance: Secondary | ICD-10-CM | POA: Diagnosis not present

## 2020-07-05 DIAGNOSIS — M810 Age-related osteoporosis without current pathological fracture: Secondary | ICD-10-CM | POA: Diagnosis not present

## 2020-07-06 DIAGNOSIS — I129 Hypertensive chronic kidney disease with stage 1 through stage 4 chronic kidney disease, or unspecified chronic kidney disease: Secondary | ICD-10-CM | POA: Diagnosis not present

## 2020-07-06 DIAGNOSIS — R2689 Other abnormalities of gait and mobility: Secondary | ICD-10-CM | POA: Diagnosis not present

## 2020-07-06 DIAGNOSIS — M810 Age-related osteoporosis without current pathological fracture: Secondary | ICD-10-CM | POA: Diagnosis not present

## 2020-07-06 DIAGNOSIS — M9702XD Periprosthetic fracture around internal prosthetic left hip joint, subsequent encounter: Secondary | ICD-10-CM | POA: Diagnosis not present

## 2020-07-06 DIAGNOSIS — R278 Other lack of coordination: Secondary | ICD-10-CM | POA: Diagnosis not present

## 2020-07-06 DIAGNOSIS — F0150 Vascular dementia without behavioral disturbance: Secondary | ICD-10-CM | POA: Diagnosis not present

## 2020-07-06 DIAGNOSIS — S32502D Unspecified fracture of left pubis, subsequent encounter for fracture with routine healing: Secondary | ICD-10-CM | POA: Diagnosis not present

## 2020-07-07 DIAGNOSIS — S32502D Unspecified fracture of left pubis, subsequent encounter for fracture with routine healing: Secondary | ICD-10-CM | POA: Diagnosis not present

## 2020-07-07 DIAGNOSIS — M810 Age-related osteoporosis without current pathological fracture: Secondary | ICD-10-CM | POA: Diagnosis not present

## 2020-07-07 DIAGNOSIS — R278 Other lack of coordination: Secondary | ICD-10-CM | POA: Diagnosis not present

## 2020-07-07 DIAGNOSIS — R2689 Other abnormalities of gait and mobility: Secondary | ICD-10-CM | POA: Diagnosis not present

## 2020-07-07 DIAGNOSIS — F0150 Vascular dementia without behavioral disturbance: Secondary | ICD-10-CM | POA: Diagnosis not present

## 2020-07-07 DIAGNOSIS — I129 Hypertensive chronic kidney disease with stage 1 through stage 4 chronic kidney disease, or unspecified chronic kidney disease: Secondary | ICD-10-CM | POA: Diagnosis not present

## 2020-07-08 DIAGNOSIS — R2689 Other abnormalities of gait and mobility: Secondary | ICD-10-CM | POA: Diagnosis not present

## 2020-07-08 DIAGNOSIS — S32502D Unspecified fracture of left pubis, subsequent encounter for fracture with routine healing: Secondary | ICD-10-CM | POA: Diagnosis not present

## 2020-07-08 DIAGNOSIS — I129 Hypertensive chronic kidney disease with stage 1 through stage 4 chronic kidney disease, or unspecified chronic kidney disease: Secondary | ICD-10-CM | POA: Diagnosis not present

## 2020-07-08 DIAGNOSIS — M810 Age-related osteoporosis without current pathological fracture: Secondary | ICD-10-CM | POA: Diagnosis not present

## 2020-07-08 DIAGNOSIS — F0150 Vascular dementia without behavioral disturbance: Secondary | ICD-10-CM | POA: Diagnosis not present

## 2020-07-08 DIAGNOSIS — R278 Other lack of coordination: Secondary | ICD-10-CM | POA: Diagnosis not present

## 2020-07-09 DIAGNOSIS — M810 Age-related osteoporosis without current pathological fracture: Secondary | ICD-10-CM | POA: Diagnosis not present

## 2020-07-09 DIAGNOSIS — R2689 Other abnormalities of gait and mobility: Secondary | ICD-10-CM | POA: Diagnosis not present

## 2020-07-09 DIAGNOSIS — R278 Other lack of coordination: Secondary | ICD-10-CM | POA: Diagnosis not present

## 2020-07-09 DIAGNOSIS — S32502D Unspecified fracture of left pubis, subsequent encounter for fracture with routine healing: Secondary | ICD-10-CM | POA: Diagnosis not present

## 2020-07-09 DIAGNOSIS — F0150 Vascular dementia without behavioral disturbance: Secondary | ICD-10-CM | POA: Diagnosis not present

## 2020-07-09 DIAGNOSIS — I129 Hypertensive chronic kidney disease with stage 1 through stage 4 chronic kidney disease, or unspecified chronic kidney disease: Secondary | ICD-10-CM | POA: Diagnosis not present

## 2020-07-10 DIAGNOSIS — S32502D Unspecified fracture of left pubis, subsequent encounter for fracture with routine healing: Secondary | ICD-10-CM | POA: Diagnosis not present

## 2020-07-10 DIAGNOSIS — R278 Other lack of coordination: Secondary | ICD-10-CM | POA: Diagnosis not present

## 2020-07-10 DIAGNOSIS — I129 Hypertensive chronic kidney disease with stage 1 through stage 4 chronic kidney disease, or unspecified chronic kidney disease: Secondary | ICD-10-CM | POA: Diagnosis not present

## 2020-07-10 DIAGNOSIS — R2689 Other abnormalities of gait and mobility: Secondary | ICD-10-CM | POA: Diagnosis not present

## 2020-07-10 DIAGNOSIS — I10 Essential (primary) hypertension: Secondary | ICD-10-CM | POA: Diagnosis not present

## 2020-07-10 DIAGNOSIS — F1129 Opioid dependence with unspecified opioid-induced disorder: Secondary | ICD-10-CM

## 2020-07-10 DIAGNOSIS — F39 Unspecified mood [affective] disorder: Secondary | ICD-10-CM

## 2020-07-10 DIAGNOSIS — N183 Chronic kidney disease, stage 3 unspecified: Secondary | ICD-10-CM | POA: Diagnosis not present

## 2020-07-10 DIAGNOSIS — M810 Age-related osteoporosis without current pathological fracture: Secondary | ICD-10-CM | POA: Diagnosis not present

## 2020-07-10 DIAGNOSIS — F0150 Vascular dementia without behavioral disturbance: Secondary | ICD-10-CM | POA: Diagnosis not present

## 2020-07-10 DIAGNOSIS — M1990 Unspecified osteoarthritis, unspecified site: Secondary | ICD-10-CM | POA: Diagnosis not present

## 2020-07-13 DIAGNOSIS — R2689 Other abnormalities of gait and mobility: Secondary | ICD-10-CM | POA: Diagnosis not present

## 2020-07-13 DIAGNOSIS — I129 Hypertensive chronic kidney disease with stage 1 through stage 4 chronic kidney disease, or unspecified chronic kidney disease: Secondary | ICD-10-CM | POA: Diagnosis not present

## 2020-07-13 DIAGNOSIS — F0150 Vascular dementia without behavioral disturbance: Secondary | ICD-10-CM | POA: Diagnosis not present

## 2020-07-13 DIAGNOSIS — R278 Other lack of coordination: Secondary | ICD-10-CM | POA: Diagnosis not present

## 2020-07-13 DIAGNOSIS — M810 Age-related osteoporosis without current pathological fracture: Secondary | ICD-10-CM | POA: Diagnosis not present

## 2020-07-13 DIAGNOSIS — S32502D Unspecified fracture of left pubis, subsequent encounter for fracture with routine healing: Secondary | ICD-10-CM | POA: Diagnosis not present

## 2020-07-14 DIAGNOSIS — S32502D Unspecified fracture of left pubis, subsequent encounter for fracture with routine healing: Secondary | ICD-10-CM | POA: Diagnosis not present

## 2020-07-14 DIAGNOSIS — F0150 Vascular dementia without behavioral disturbance: Secondary | ICD-10-CM | POA: Diagnosis not present

## 2020-07-14 DIAGNOSIS — I129 Hypertensive chronic kidney disease with stage 1 through stage 4 chronic kidney disease, or unspecified chronic kidney disease: Secondary | ICD-10-CM | POA: Diagnosis not present

## 2020-07-14 DIAGNOSIS — M810 Age-related osteoporosis without current pathological fracture: Secondary | ICD-10-CM | POA: Diagnosis not present

## 2020-07-14 DIAGNOSIS — R278 Other lack of coordination: Secondary | ICD-10-CM | POA: Diagnosis not present

## 2020-07-14 DIAGNOSIS — R2689 Other abnormalities of gait and mobility: Secondary | ICD-10-CM | POA: Diagnosis not present

## 2020-07-21 DIAGNOSIS — F0150 Vascular dementia without behavioral disturbance: Secondary | ICD-10-CM | POA: Diagnosis not present

## 2020-07-21 DIAGNOSIS — S32502D Unspecified fracture of left pubis, subsequent encounter for fracture with routine healing: Secondary | ICD-10-CM | POA: Diagnosis not present

## 2020-07-21 DIAGNOSIS — R278 Other lack of coordination: Secondary | ICD-10-CM | POA: Diagnosis not present

## 2020-07-21 DIAGNOSIS — I129 Hypertensive chronic kidney disease with stage 1 through stage 4 chronic kidney disease, or unspecified chronic kidney disease: Secondary | ICD-10-CM | POA: Diagnosis not present

## 2020-07-21 DIAGNOSIS — M810 Age-related osteoporosis without current pathological fracture: Secondary | ICD-10-CM | POA: Diagnosis not present

## 2020-07-21 DIAGNOSIS — R2689 Other abnormalities of gait and mobility: Secondary | ICD-10-CM | POA: Diagnosis not present

## 2020-07-23 DIAGNOSIS — M810 Age-related osteoporosis without current pathological fracture: Secondary | ICD-10-CM | POA: Diagnosis not present

## 2020-07-23 DIAGNOSIS — I129 Hypertensive chronic kidney disease with stage 1 through stage 4 chronic kidney disease, or unspecified chronic kidney disease: Secondary | ICD-10-CM | POA: Diagnosis not present

## 2020-07-23 DIAGNOSIS — R278 Other lack of coordination: Secondary | ICD-10-CM | POA: Diagnosis not present

## 2020-07-23 DIAGNOSIS — S32502D Unspecified fracture of left pubis, subsequent encounter for fracture with routine healing: Secondary | ICD-10-CM | POA: Diagnosis not present

## 2020-07-23 DIAGNOSIS — R2689 Other abnormalities of gait and mobility: Secondary | ICD-10-CM | POA: Diagnosis not present

## 2020-07-23 DIAGNOSIS — F0150 Vascular dementia without behavioral disturbance: Secondary | ICD-10-CM | POA: Diagnosis not present

## 2020-07-24 DIAGNOSIS — S32502D Unspecified fracture of left pubis, subsequent encounter for fracture with routine healing: Secondary | ICD-10-CM | POA: Diagnosis not present

## 2020-07-24 DIAGNOSIS — I129 Hypertensive chronic kidney disease with stage 1 through stage 4 chronic kidney disease, or unspecified chronic kidney disease: Secondary | ICD-10-CM | POA: Diagnosis not present

## 2020-07-24 DIAGNOSIS — R278 Other lack of coordination: Secondary | ICD-10-CM | POA: Diagnosis not present

## 2020-07-24 DIAGNOSIS — F0150 Vascular dementia without behavioral disturbance: Secondary | ICD-10-CM | POA: Diagnosis not present

## 2020-07-24 DIAGNOSIS — M810 Age-related osteoporosis without current pathological fracture: Secondary | ICD-10-CM | POA: Diagnosis not present

## 2020-07-24 DIAGNOSIS — R2689 Other abnormalities of gait and mobility: Secondary | ICD-10-CM | POA: Diagnosis not present

## 2020-07-27 DIAGNOSIS — S32502D Unspecified fracture of left pubis, subsequent encounter for fracture with routine healing: Secondary | ICD-10-CM | POA: Diagnosis not present

## 2020-07-27 DIAGNOSIS — I129 Hypertensive chronic kidney disease with stage 1 through stage 4 chronic kidney disease, or unspecified chronic kidney disease: Secondary | ICD-10-CM | POA: Diagnosis not present

## 2020-07-27 DIAGNOSIS — M810 Age-related osteoporosis without current pathological fracture: Secondary | ICD-10-CM | POA: Diagnosis not present

## 2020-07-27 DIAGNOSIS — R278 Other lack of coordination: Secondary | ICD-10-CM | POA: Diagnosis not present

## 2020-07-27 DIAGNOSIS — F0150 Vascular dementia without behavioral disturbance: Secondary | ICD-10-CM | POA: Diagnosis not present

## 2020-07-27 DIAGNOSIS — R2689 Other abnormalities of gait and mobility: Secondary | ICD-10-CM | POA: Diagnosis not present

## 2020-07-28 DIAGNOSIS — R278 Other lack of coordination: Secondary | ICD-10-CM | POA: Diagnosis not present

## 2020-07-28 DIAGNOSIS — R2689 Other abnormalities of gait and mobility: Secondary | ICD-10-CM | POA: Diagnosis not present

## 2020-07-28 DIAGNOSIS — F0150 Vascular dementia without behavioral disturbance: Secondary | ICD-10-CM | POA: Diagnosis not present

## 2020-07-28 DIAGNOSIS — I129 Hypertensive chronic kidney disease with stage 1 through stage 4 chronic kidney disease, or unspecified chronic kidney disease: Secondary | ICD-10-CM | POA: Diagnosis not present

## 2020-07-28 DIAGNOSIS — S32502D Unspecified fracture of left pubis, subsequent encounter for fracture with routine healing: Secondary | ICD-10-CM | POA: Diagnosis not present

## 2020-07-28 DIAGNOSIS — M810 Age-related osteoporosis without current pathological fracture: Secondary | ICD-10-CM | POA: Diagnosis not present

## 2020-07-29 DIAGNOSIS — R2689 Other abnormalities of gait and mobility: Secondary | ICD-10-CM | POA: Diagnosis not present

## 2020-07-29 DIAGNOSIS — S32502D Unspecified fracture of left pubis, subsequent encounter for fracture with routine healing: Secondary | ICD-10-CM | POA: Diagnosis not present

## 2020-07-29 DIAGNOSIS — I129 Hypertensive chronic kidney disease with stage 1 through stage 4 chronic kidney disease, or unspecified chronic kidney disease: Secondary | ICD-10-CM | POA: Diagnosis not present

## 2020-07-29 DIAGNOSIS — F0150 Vascular dementia without behavioral disturbance: Secondary | ICD-10-CM | POA: Diagnosis not present

## 2020-07-29 DIAGNOSIS — R278 Other lack of coordination: Secondary | ICD-10-CM | POA: Diagnosis not present

## 2020-07-29 DIAGNOSIS — M810 Age-related osteoporosis without current pathological fracture: Secondary | ICD-10-CM | POA: Diagnosis not present

## 2020-07-30 DIAGNOSIS — R2689 Other abnormalities of gait and mobility: Secondary | ICD-10-CM | POA: Diagnosis not present

## 2020-07-30 DIAGNOSIS — M810 Age-related osteoporosis without current pathological fracture: Secondary | ICD-10-CM | POA: Diagnosis not present

## 2020-07-30 DIAGNOSIS — R278 Other lack of coordination: Secondary | ICD-10-CM | POA: Diagnosis not present

## 2020-07-30 DIAGNOSIS — I129 Hypertensive chronic kidney disease with stage 1 through stage 4 chronic kidney disease, or unspecified chronic kidney disease: Secondary | ICD-10-CM | POA: Diagnosis not present

## 2020-07-30 DIAGNOSIS — S32502D Unspecified fracture of left pubis, subsequent encounter for fracture with routine healing: Secondary | ICD-10-CM | POA: Diagnosis not present

## 2020-07-30 DIAGNOSIS — F0150 Vascular dementia without behavioral disturbance: Secondary | ICD-10-CM | POA: Diagnosis not present

## 2020-07-31 DIAGNOSIS — I129 Hypertensive chronic kidney disease with stage 1 through stage 4 chronic kidney disease, or unspecified chronic kidney disease: Secondary | ICD-10-CM | POA: Diagnosis not present

## 2020-07-31 DIAGNOSIS — M810 Age-related osteoporosis without current pathological fracture: Secondary | ICD-10-CM | POA: Diagnosis not present

## 2020-07-31 DIAGNOSIS — S32502D Unspecified fracture of left pubis, subsequent encounter for fracture with routine healing: Secondary | ICD-10-CM | POA: Diagnosis not present

## 2020-07-31 DIAGNOSIS — R278 Other lack of coordination: Secondary | ICD-10-CM | POA: Diagnosis not present

## 2020-07-31 DIAGNOSIS — R2689 Other abnormalities of gait and mobility: Secondary | ICD-10-CM | POA: Diagnosis not present

## 2020-07-31 DIAGNOSIS — F0150 Vascular dementia without behavioral disturbance: Secondary | ICD-10-CM | POA: Diagnosis not present

## 2020-08-03 DIAGNOSIS — S32502D Unspecified fracture of left pubis, subsequent encounter for fracture with routine healing: Secondary | ICD-10-CM | POA: Diagnosis not present

## 2020-08-03 DIAGNOSIS — F0150 Vascular dementia without behavioral disturbance: Secondary | ICD-10-CM | POA: Diagnosis not present

## 2020-08-03 DIAGNOSIS — I129 Hypertensive chronic kidney disease with stage 1 through stage 4 chronic kidney disease, or unspecified chronic kidney disease: Secondary | ICD-10-CM | POA: Diagnosis not present

## 2020-08-03 DIAGNOSIS — R2689 Other abnormalities of gait and mobility: Secondary | ICD-10-CM | POA: Diagnosis not present

## 2020-08-03 DIAGNOSIS — R278 Other lack of coordination: Secondary | ICD-10-CM | POA: Diagnosis not present

## 2020-08-03 DIAGNOSIS — M810 Age-related osteoporosis without current pathological fracture: Secondary | ICD-10-CM | POA: Diagnosis not present

## 2020-08-03 DIAGNOSIS — M9702XD Periprosthetic fracture around internal prosthetic left hip joint, subsequent encounter: Secondary | ICD-10-CM | POA: Diagnosis not present

## 2020-08-05 DIAGNOSIS — D692 Other nonthrombocytopenic purpura: Secondary | ICD-10-CM

## 2020-08-05 DIAGNOSIS — F1120 Opioid dependence, uncomplicated: Secondary | ICD-10-CM

## 2020-08-05 DIAGNOSIS — S32502D Unspecified fracture of left pubis, subsequent encounter for fracture with routine healing: Secondary | ICD-10-CM | POA: Diagnosis not present

## 2020-08-05 DIAGNOSIS — R2689 Other abnormalities of gait and mobility: Secondary | ICD-10-CM | POA: Diagnosis not present

## 2020-08-05 DIAGNOSIS — D649 Anemia, unspecified: Secondary | ICD-10-CM

## 2020-08-05 DIAGNOSIS — M1990 Unspecified osteoarthritis, unspecified site: Secondary | ICD-10-CM | POA: Diagnosis not present

## 2020-08-05 DIAGNOSIS — F0150 Vascular dementia without behavioral disturbance: Secondary | ICD-10-CM | POA: Diagnosis not present

## 2020-08-05 DIAGNOSIS — N184 Chronic kidney disease, stage 4 (severe): Secondary | ICD-10-CM | POA: Diagnosis not present

## 2020-08-05 DIAGNOSIS — I10 Essential (primary) hypertension: Secondary | ICD-10-CM | POA: Diagnosis not present

## 2020-08-05 DIAGNOSIS — M810 Age-related osteoporosis without current pathological fracture: Secondary | ICD-10-CM | POA: Diagnosis not present

## 2020-08-05 DIAGNOSIS — F39 Unspecified mood [affective] disorder: Secondary | ICD-10-CM

## 2020-08-05 DIAGNOSIS — R278 Other lack of coordination: Secondary | ICD-10-CM | POA: Diagnosis not present

## 2020-08-05 DIAGNOSIS — I129 Hypertensive chronic kidney disease with stage 1 through stage 4 chronic kidney disease, or unspecified chronic kidney disease: Secondary | ICD-10-CM | POA: Diagnosis not present

## 2020-08-06 DIAGNOSIS — I129 Hypertensive chronic kidney disease with stage 1 through stage 4 chronic kidney disease, or unspecified chronic kidney disease: Secondary | ICD-10-CM | POA: Diagnosis not present

## 2020-08-06 DIAGNOSIS — F0150 Vascular dementia without behavioral disturbance: Secondary | ICD-10-CM | POA: Diagnosis not present

## 2020-08-06 DIAGNOSIS — S32502D Unspecified fracture of left pubis, subsequent encounter for fracture with routine healing: Secondary | ICD-10-CM | POA: Diagnosis not present

## 2020-08-06 DIAGNOSIS — R278 Other lack of coordination: Secondary | ICD-10-CM | POA: Diagnosis not present

## 2020-08-06 DIAGNOSIS — R2689 Other abnormalities of gait and mobility: Secondary | ICD-10-CM | POA: Diagnosis not present

## 2020-08-06 DIAGNOSIS — M810 Age-related osteoporosis without current pathological fracture: Secondary | ICD-10-CM | POA: Diagnosis not present

## 2020-08-07 DIAGNOSIS — M810 Age-related osteoporosis without current pathological fracture: Secondary | ICD-10-CM | POA: Diagnosis not present

## 2020-08-07 DIAGNOSIS — F0150 Vascular dementia without behavioral disturbance: Secondary | ICD-10-CM | POA: Diagnosis not present

## 2020-08-07 DIAGNOSIS — I129 Hypertensive chronic kidney disease with stage 1 through stage 4 chronic kidney disease, or unspecified chronic kidney disease: Secondary | ICD-10-CM | POA: Diagnosis not present

## 2020-08-07 DIAGNOSIS — R278 Other lack of coordination: Secondary | ICD-10-CM | POA: Diagnosis not present

## 2020-08-07 DIAGNOSIS — R2689 Other abnormalities of gait and mobility: Secondary | ICD-10-CM | POA: Diagnosis not present

## 2020-08-07 DIAGNOSIS — S32502D Unspecified fracture of left pubis, subsequent encounter for fracture with routine healing: Secondary | ICD-10-CM | POA: Diagnosis not present

## 2020-08-08 DIAGNOSIS — M810 Age-related osteoporosis without current pathological fracture: Secondary | ICD-10-CM | POA: Diagnosis not present

## 2020-08-08 DIAGNOSIS — S32502D Unspecified fracture of left pubis, subsequent encounter for fracture with routine healing: Secondary | ICD-10-CM | POA: Diagnosis not present

## 2020-08-08 DIAGNOSIS — I129 Hypertensive chronic kidney disease with stage 1 through stage 4 chronic kidney disease, or unspecified chronic kidney disease: Secondary | ICD-10-CM | POA: Diagnosis not present

## 2020-08-08 DIAGNOSIS — R2689 Other abnormalities of gait and mobility: Secondary | ICD-10-CM | POA: Diagnosis not present

## 2020-08-08 DIAGNOSIS — R278 Other lack of coordination: Secondary | ICD-10-CM | POA: Diagnosis not present

## 2020-08-08 DIAGNOSIS — F0150 Vascular dementia without behavioral disturbance: Secondary | ICD-10-CM | POA: Diagnosis not present

## 2020-08-10 DIAGNOSIS — S32502D Unspecified fracture of left pubis, subsequent encounter for fracture with routine healing: Secondary | ICD-10-CM | POA: Diagnosis not present

## 2020-08-10 DIAGNOSIS — R2689 Other abnormalities of gait and mobility: Secondary | ICD-10-CM | POA: Diagnosis not present

## 2020-08-10 DIAGNOSIS — R278 Other lack of coordination: Secondary | ICD-10-CM | POA: Diagnosis not present

## 2020-08-10 DIAGNOSIS — F0150 Vascular dementia without behavioral disturbance: Secondary | ICD-10-CM | POA: Diagnosis not present

## 2020-08-10 DIAGNOSIS — M810 Age-related osteoporosis without current pathological fracture: Secondary | ICD-10-CM | POA: Diagnosis not present

## 2020-08-10 DIAGNOSIS — I129 Hypertensive chronic kidney disease with stage 1 through stage 4 chronic kidney disease, or unspecified chronic kidney disease: Secondary | ICD-10-CM | POA: Diagnosis not present

## 2020-08-11 DIAGNOSIS — R2689 Other abnormalities of gait and mobility: Secondary | ICD-10-CM | POA: Diagnosis not present

## 2020-08-11 DIAGNOSIS — F0150 Vascular dementia without behavioral disturbance: Secondary | ICD-10-CM | POA: Diagnosis not present

## 2020-08-11 DIAGNOSIS — R278 Other lack of coordination: Secondary | ICD-10-CM | POA: Diagnosis not present

## 2020-08-11 DIAGNOSIS — M810 Age-related osteoporosis without current pathological fracture: Secondary | ICD-10-CM | POA: Diagnosis not present

## 2020-08-11 DIAGNOSIS — S32502D Unspecified fracture of left pubis, subsequent encounter for fracture with routine healing: Secondary | ICD-10-CM | POA: Diagnosis not present

## 2020-08-11 DIAGNOSIS — I129 Hypertensive chronic kidney disease with stage 1 through stage 4 chronic kidney disease, or unspecified chronic kidney disease: Secondary | ICD-10-CM | POA: Diagnosis not present

## 2020-08-13 DIAGNOSIS — M810 Age-related osteoporosis without current pathological fracture: Secondary | ICD-10-CM | POA: Diagnosis not present

## 2020-08-13 DIAGNOSIS — R2689 Other abnormalities of gait and mobility: Secondary | ICD-10-CM | POA: Diagnosis not present

## 2020-08-13 DIAGNOSIS — F0150 Vascular dementia without behavioral disturbance: Secondary | ICD-10-CM | POA: Diagnosis not present

## 2020-08-13 DIAGNOSIS — R278 Other lack of coordination: Secondary | ICD-10-CM | POA: Diagnosis not present

## 2020-08-13 DIAGNOSIS — I129 Hypertensive chronic kidney disease with stage 1 through stage 4 chronic kidney disease, or unspecified chronic kidney disease: Secondary | ICD-10-CM | POA: Diagnosis not present

## 2020-08-13 DIAGNOSIS — S32502D Unspecified fracture of left pubis, subsequent encounter for fracture with routine healing: Secondary | ICD-10-CM | POA: Diagnosis not present

## 2020-08-17 DIAGNOSIS — R278 Other lack of coordination: Secondary | ICD-10-CM | POA: Diagnosis not present

## 2020-08-17 DIAGNOSIS — M810 Age-related osteoporosis without current pathological fracture: Secondary | ICD-10-CM | POA: Diagnosis not present

## 2020-08-17 DIAGNOSIS — R2689 Other abnormalities of gait and mobility: Secondary | ICD-10-CM | POA: Diagnosis not present

## 2020-08-17 DIAGNOSIS — F0150 Vascular dementia without behavioral disturbance: Secondary | ICD-10-CM | POA: Diagnosis not present

## 2020-08-17 DIAGNOSIS — I129 Hypertensive chronic kidney disease with stage 1 through stage 4 chronic kidney disease, or unspecified chronic kidney disease: Secondary | ICD-10-CM | POA: Diagnosis not present

## 2020-08-17 DIAGNOSIS — S32502D Unspecified fracture of left pubis, subsequent encounter for fracture with routine healing: Secondary | ICD-10-CM | POA: Diagnosis not present

## 2020-08-19 DIAGNOSIS — H35373 Puckering of macula, bilateral: Secondary | ICD-10-CM | POA: Diagnosis not present

## 2020-08-19 DIAGNOSIS — Z0100 Encounter for examination of eyes and vision without abnormal findings: Secondary | ICD-10-CM | POA: Diagnosis not present

## 2020-09-14 DIAGNOSIS — N1832 Chronic kidney disease, stage 3b: Secondary | ICD-10-CM | POA: Diagnosis not present

## 2020-09-14 DIAGNOSIS — M159 Polyosteoarthritis, unspecified: Secondary | ICD-10-CM | POA: Diagnosis not present

## 2020-09-14 DIAGNOSIS — E441 Mild protein-calorie malnutrition: Secondary | ICD-10-CM

## 2020-09-14 DIAGNOSIS — I10 Essential (primary) hypertension: Secondary | ICD-10-CM | POA: Diagnosis not present

## 2020-09-14 DIAGNOSIS — F0150 Vascular dementia without behavioral disturbance: Secondary | ICD-10-CM

## 2020-09-14 DIAGNOSIS — D692 Other nonthrombocytopenic purpura: Secondary | ICD-10-CM

## 2020-09-24 DIAGNOSIS — E441 Mild protein-calorie malnutrition: Secondary | ICD-10-CM | POA: Diagnosis not present

## 2020-10-02 DIAGNOSIS — F0150 Vascular dementia without behavioral disturbance: Secondary | ICD-10-CM | POA: Diagnosis not present

## 2020-10-02 DIAGNOSIS — N183 Chronic kidney disease, stage 3 unspecified: Secondary | ICD-10-CM | POA: Diagnosis not present

## 2020-10-02 DIAGNOSIS — E43 Unspecified severe protein-calorie malnutrition: Secondary | ICD-10-CM | POA: Diagnosis not present

## 2020-10-02 DIAGNOSIS — D649 Anemia, unspecified: Secondary | ICD-10-CM | POA: Diagnosis not present

## 2020-10-02 DIAGNOSIS — D692 Other nonthrombocytopenic purpura: Secondary | ICD-10-CM | POA: Diagnosis not present

## 2020-10-02 DIAGNOSIS — I10 Essential (primary) hypertension: Secondary | ICD-10-CM | POA: Diagnosis not present

## 2020-10-02 DIAGNOSIS — F39 Unspecified mood [affective] disorder: Secondary | ICD-10-CM | POA: Diagnosis not present

## 2020-10-02 DIAGNOSIS — B351 Tinea unguium: Secondary | ICD-10-CM | POA: Diagnosis not present

## 2020-10-02 DIAGNOSIS — M1990 Unspecified osteoarthritis, unspecified site: Secondary | ICD-10-CM | POA: Diagnosis not present

## 2020-11-11 DIAGNOSIS — Z79899 Other long term (current) drug therapy: Secondary | ICD-10-CM | POA: Diagnosis not present

## 2020-11-11 DIAGNOSIS — R062 Wheezing: Secondary | ICD-10-CM | POA: Diagnosis not present

## 2020-11-11 DIAGNOSIS — R4181 Age-related cognitive decline: Secondary | ICD-10-CM | POA: Diagnosis not present

## 2020-11-11 DIAGNOSIS — R820 Chyluria: Secondary | ICD-10-CM | POA: Diagnosis not present

## 2020-11-11 DIAGNOSIS — R0602 Shortness of breath: Secondary | ICD-10-CM | POA: Diagnosis not present

## 2020-11-11 DIAGNOSIS — R319 Hematuria, unspecified: Secondary | ICD-10-CM | POA: Diagnosis not present

## 2020-11-11 DIAGNOSIS — R0989 Other specified symptoms and signs involving the circulatory and respiratory systems: Secondary | ICD-10-CM | POA: Diagnosis not present

## 2020-11-12 DIAGNOSIS — F05 Delirium due to known physiological condition: Secondary | ICD-10-CM | POA: Diagnosis not present

## 2020-11-20 DEATH — deceased
